# Patient Record
Sex: Female | Born: 1950 | Race: Black or African American | Hispanic: No | Marital: Married | Smoking: Former smoker
Health system: Southern US, Community
[De-identification: ages and names within clinical notes are randomized; demographics above are authoritative.]

## PROBLEM LIST (undated history)

## (undated) DIAGNOSIS — I10 Essential (primary) hypertension: Secondary | ICD-10-CM

## (undated) DIAGNOSIS — G510 Bell's palsy: Secondary | ICD-10-CM

## (undated) DIAGNOSIS — J3089 Other allergic rhinitis: Secondary | ICD-10-CM

## (undated) DIAGNOSIS — E119 Type 2 diabetes mellitus without complications: Secondary | ICD-10-CM

## (undated) DIAGNOSIS — R0600 Dyspnea, unspecified: Secondary | ICD-10-CM

## (undated) DIAGNOSIS — K219 Gastro-esophageal reflux disease without esophagitis: Secondary | ICD-10-CM

## (undated) DIAGNOSIS — T4145XA Adverse effect of unspecified anesthetic, initial encounter: Secondary | ICD-10-CM

## (undated) DIAGNOSIS — E0590 Thyrotoxicosis, unspecified without thyrotoxic crisis or storm: Secondary | ICD-10-CM

## (undated) DIAGNOSIS — I209 Angina pectoris, unspecified: Secondary | ICD-10-CM

## (undated) DIAGNOSIS — G4730 Sleep apnea, unspecified: Secondary | ICD-10-CM

## (undated) DIAGNOSIS — T8859XA Other complications of anesthesia, initial encounter: Secondary | ICD-10-CM

## (undated) DIAGNOSIS — N189 Chronic kidney disease, unspecified: Secondary | ICD-10-CM

## (undated) DIAGNOSIS — H409 Unspecified glaucoma: Secondary | ICD-10-CM

## (undated) DIAGNOSIS — I219 Acute myocardial infarction, unspecified: Secondary | ICD-10-CM

## (undated) DIAGNOSIS — I509 Heart failure, unspecified: Secondary | ICD-10-CM

## (undated) HISTORY — PX: BREAST REDUCTION SURGERY: SHX8

## (undated) HISTORY — PX: CORONARY ANGIOPLASTY WITH STENT PLACEMENT: SHX49

## (undated) HISTORY — PX: APPENDECTOMY: SHX54

## (undated) HISTORY — PX: HERNIA REPAIR: SHX51

## (undated) HISTORY — PX: ABDOMINAL HYSTERECTOMY: SHX81

## (undated) HISTORY — PX: CATARACT EXTRACTION, BILATERAL: SHX1313

## (undated) HISTORY — PX: CHOLECYSTECTOMY: SHX55

---

## 2017-03-19 ENCOUNTER — Other Ambulatory Visit (HOSPITAL_COMMUNITY): Payer: Self-pay | Admitting: Orthopedic Surgery

## 2017-03-19 DIAGNOSIS — M75122 Complete rotator cuff tear or rupture of left shoulder, not specified as traumatic: Secondary | ICD-10-CM

## 2017-03-20 ENCOUNTER — Ambulatory Visit
Admission: RE | Admit: 2017-03-20 | Discharge: 2017-03-20 | Disposition: A | Discharge status: Home / Self Care | Payer: Self-pay | Source: Ambulatory Visit | Attending: Orthopedic Surgery | Admitting: Orthopedic Surgery

## 2017-03-20 ENCOUNTER — Other Ambulatory Visit: Payer: Self-pay | Admitting: Orthopedic Surgery

## 2017-03-20 DIAGNOSIS — M25512 Pain in left shoulder: Secondary | ICD-10-CM

## 2017-04-04 ENCOUNTER — Ambulatory Visit
Admission: RE | Admit: 2017-04-04 | Discharge: 2017-04-04 | Disposition: A | Discharge status: Home / Self Care | Payer: Non-veteran care | Source: Ambulatory Visit | Attending: Orthopedic Surgery | Admitting: Orthopedic Surgery

## 2017-04-04 DIAGNOSIS — M75122 Complete rotator cuff tear or rupture of left shoulder, not specified as traumatic: Secondary | ICD-10-CM | POA: Diagnosis not present

## 2017-04-04 DIAGNOSIS — M19012 Primary osteoarthritis, left shoulder: Secondary | ICD-10-CM | POA: Insufficient documentation

## 2017-04-22 ENCOUNTER — Other Ambulatory Visit (HOSPITAL_COMMUNITY): Payer: Non-veteran care

## 2017-04-23 ENCOUNTER — Other Ambulatory Visit: Payer: Self-pay | Admitting: Physician Assistant

## 2017-04-23 DIAGNOSIS — Z01810 Encounter for preprocedural cardiovascular examination: Secondary | ICD-10-CM

## 2017-04-25 ENCOUNTER — Inpatient Hospital Stay: Admission: RE | Admit: 2017-04-25 | Payer: Self-pay | Source: Ambulatory Visit

## 2017-04-26 ENCOUNTER — Telehealth (HOSPITAL_COMMUNITY): Payer: Self-pay | Admitting: *Deleted

## 2017-04-26 NOTE — Telephone Encounter (Signed)
Patient given detailed instructions per Stress Test Requisition Sheet for test on 04/29/17 at 7:30.Patient Notified to arrive 30 minutes early, and that it is imperative to arrive on time for appointment to keep from having the test rescheduled.  Patient verbalized understanding. Katrina Hughes

## 2017-04-29 ENCOUNTER — Ambulatory Visit (HOSPITAL_BASED_OUTPATIENT_CLINIC_OR_DEPARTMENT_OTHER): Payer: Non-veteran care

## 2017-04-29 ENCOUNTER — Ambulatory Visit (HOSPITAL_COMMUNITY): Payer: Non-veteran care | Attending: Internal Medicine

## 2017-04-29 DIAGNOSIS — Z01810 Encounter for preprocedural cardiovascular examination: Secondary | ICD-10-CM | POA: Insufficient documentation

## 2017-04-29 MED ORDER — SODIUM CHLORIDE 0.9 % IV SOLN
10.0000 ug/kg/min | INTRAVENOUS | Status: AC
  Administered 2017-04-29: 30 ug/kg/min via INTRAVENOUS

## 2017-04-30 ENCOUNTER — Other Ambulatory Visit (HOSPITAL_COMMUNITY): Payer: Non-veteran care

## 2017-05-09 ENCOUNTER — Inpatient Hospital Stay: Admission: RE | Admit: 2017-05-09 | Payer: Non-veteran care | Source: Ambulatory Visit

## 2017-05-10 ENCOUNTER — Inpatient Hospital Stay: Admission: RE | Admit: 2017-05-10 | Payer: Non-veteran care | Source: Ambulatory Visit

## 2017-05-28 ENCOUNTER — Encounter: Payer: Self-pay | Admitting: *Deleted

## 2017-05-28 ENCOUNTER — Encounter
Admission: RE | Admit: 2017-05-28 | Discharge: 2017-05-28 | Disposition: A | Discharge status: Home / Self Care | Payer: Non-veteran care | Source: Ambulatory Visit | Attending: Orthopedic Surgery | Admitting: Orthopedic Surgery

## 2017-05-28 ENCOUNTER — Other Ambulatory Visit: Payer: Self-pay

## 2017-05-28 DIAGNOSIS — R9431 Abnormal electrocardiogram [ECG] [EKG]: Secondary | ICD-10-CM | POA: Diagnosis not present

## 2017-05-28 DIAGNOSIS — Z01810 Encounter for preprocedural cardiovascular examination: Secondary | ICD-10-CM | POA: Insufficient documentation

## 2017-05-28 DIAGNOSIS — Z01812 Encounter for preprocedural laboratory examination: Secondary | ICD-10-CM | POA: Insufficient documentation

## 2017-05-28 DIAGNOSIS — I252 Old myocardial infarction: Secondary | ICD-10-CM | POA: Diagnosis not present

## 2017-05-28 HISTORY — DX: Sleep apnea, unspecified: G47.30

## 2017-05-28 HISTORY — DX: Dyspnea, unspecified: R06.00

## 2017-05-28 HISTORY — DX: Gastro-esophageal reflux disease without esophagitis: K21.9

## 2017-05-28 HISTORY — DX: Unspecified glaucoma: H40.9

## 2017-05-28 HISTORY — DX: Other complications of anesthesia, initial encounter: T88.59XA

## 2017-05-28 HISTORY — DX: Essential (primary) hypertension: I10

## 2017-05-28 HISTORY — DX: Heart failure, unspecified: I50.9

## 2017-05-28 HISTORY — DX: Acute myocardial infarction, unspecified: I21.9

## 2017-05-28 HISTORY — DX: Type 2 diabetes mellitus without complications: E11.9

## 2017-05-28 HISTORY — DX: Bell's palsy: G51.0

## 2017-05-28 HISTORY — DX: Other allergic rhinitis: J30.89

## 2017-05-28 HISTORY — DX: Thyrotoxicosis, unspecified without thyrotoxic crisis or storm: E05.90

## 2017-05-28 HISTORY — DX: Adverse effect of unspecified anesthetic, initial encounter: T41.45XA

## 2017-05-28 LAB — BASIC METABOLIC PANEL
ANION GAP: 11 (ref 5–15)
BUN: 17 mg/dL (ref 6–20)
CHLORIDE: 104 mmol/L (ref 101–111)
CO2: 21 mmol/L — AB (ref 22–32)
Calcium: 10.2 mg/dL (ref 8.9–10.3)
Creatinine, Ser: 1.94 mg/dL — ABNORMAL HIGH (ref 0.44–1.00)
GFR calc non Af Amer: 26 mL/min — ABNORMAL LOW (ref >60)
GFR, EST AFRICAN AMERICAN: 30 mL/min — AB (ref >60)
Glucose, Bld: 160 mg/dL — ABNORMAL HIGH (ref 65–99)
Potassium: 3.6 mmol/L (ref 3.5–5.1)
SODIUM: 136 mmol/L (ref 135–145)

## 2017-05-28 LAB — CBC
HEMATOCRIT: 40.7 % (ref 35.0–47.0)
HEMOGLOBIN: 13.2 g/dL (ref 12.0–16.0)
MCH: 30.7 pg (ref 26.0–34.0)
MCHC: 32.4 g/dL (ref 32.0–36.0)
MCV: 94.9 fL (ref 80.0–100.0)
Platelets: 193 10*3/uL (ref 150–440)
RBC: 4.29 MIL/uL (ref 3.80–5.20)
RDW: 14.7 % — ABNORMAL HIGH (ref 11.5–14.5)
WBC: 6 10*3/uL (ref 3.6–11.0)

## 2017-05-28 LAB — URINALYSIS, COMPLETE (UACMP) WITH MICROSCOPIC
Bacteria, UA: NONE SEEN
Bilirubin Urine: NEGATIVE
Glucose, UA: NEGATIVE mg/dL
Hgb urine dipstick: NEGATIVE
Ketones, ur: NEGATIVE mg/dL
Nitrite: NEGATIVE
PH: 5 (ref 5.0–8.0)
Protein, ur: 100 mg/dL — AB
SPECIFIC GRAVITY, URINE: 1.012 (ref 1.005–1.030)

## 2017-05-28 LAB — PROTIME-INR
INR: 0.99
PROTHROMBIN TIME: 13 s (ref 11.4–15.2)

## 2017-05-28 LAB — SURGICAL PCR SCREEN
MRSA, PCR: NEGATIVE
STAPHYLOCOCCUS AUREUS: NEGATIVE

## 2017-05-28 NOTE — Patient Instructions (Signed)
Your procedure is scheduled on: 06/03/17 Mon Report to Same Day Surgery 2nd floor medical mall [facility] Entrance-take elevator on left to 2nd floor.  Check in with surgery information desk.) To find out your arrival time please call [phone] between 1PM - 3PM on 05/31/17 Fri  Remember: Instructions that are not followed completely may result in serious medical risk, up to and including death, or upon the discretion of your surgeon and anesthesiologist your surgery may need to be rescheduled.    _x___ 1. Do not eat food after midnight the night before your procedure. You may drink clear liquids up to 2 hours before you are scheduled to arrive at the hospital for your procedure.  Do not drink clear liquids within 2 hours of your scheduled arrival to the hospital.  Clear liquids include  --Water or Apple juice without pulp  --Clear carbohydrate beverage such as ClearFast or Gatorade  --Black Coffee or Clear Tea (No milk, no creamers, do not add anything to                  the coffee or Tea Type 1 and type 2 diabetics should only drink water.  No gum chewing or hard candies.     __x__ 2. No Alcohol for 24 hours before or after surgery.   __x__3. No Smoking or e-cigarettes for 24 prior to surgery.  Do not use any chewable tobacco products for at least 6 hour prior to surgery   ____  4. Bring all medications with you on the day of surgery if instructed.    __x__ 5. Notify your doctor if there is any change in your medical condition     (cold, fever, infections).    x___6. On the morning of surgery brush your teeth with toothpaste and water.  You may rinse your mouth with mouth wash if you wish.  Do not swallow any toothpaste or mouthwash.   Do not wear jewelry, make-up, hairpins, clips or nail polish.  Do not wear lotions, powders, or perfumes. You may wear deodorant.  Do not shave 48 hours prior to surgery. Men may shave face and neck.  Do not bring valuables to the hospital.     [facility] is not responsible for any belongings or valuables.               Contacts, dentures or bridgework may not be worn into surgery.  Leave your suitcase in the car. After surgery it may be brought to your room.  For patients admitted to the hospital, discharge time is determined by your                       treatment team.  _  Patients discharged the day of surgery will not be allowed to drive home.  You will need someone to drive you home and stay with you the night of your procedure.    Please read over the following fact sheets that you were given:   [facility] Preparing for Surgery and or MRSA Information   _x___ Take anti-hypertensive listed below, cardiac, seizure, asthma,     anti-reflux and psychiatric medicines. These include:  1. carvedilol (COREG) 12.5 MG tablet  2.latanoprost (XALATAN) 0.005 % ophthalmic solution  3.methimazole (TAPAZOLE) 5 MG tablet  4.pantoprazole (PROTONIX) 20 MG tablet  5.timolol (BETIMOL) 0.5 % ophthalmic solution  6.  ____Fleets enema or Magnesium Citrate as directed.   _x___ Use CHG Soap or sage wipes as  directed on instruction sheet   ____ Use inhalers on the day of surgery and bring to hospital day of surgery  ____ Stop Metformin and Janumet 2 days prior to surgery.    ___x_ Take 1/2 of usual insulin dose the night before surgery and none on the morning     surgery.   _x___ Follow recommendations from Cardiologist, Pulmonologist or PCP regarding          stopping Aspirin, Coumadin, Plavix ,Eliquis, Effient, or Pradaxa, and Pletal.               Stop  Plavix if OK with physician  X____Stop Anti-inflammatories such as Advil, Aleve, Ibuprofen, Motrin, Naproxen, Naprosyn, Goodies powders or aspirin products. OK to take Tylenol and                          Celebrex.   _x___ Stop supplements until after surgery.  But may continue Vitamin D, Vitamin B,       and multivitamin.   __x__ Bring C-Pap to the hospital.

## 2017-05-28 NOTE — Pre-Procedure Instructions (Addendum)
Called anesthesia regarding medical history, medical clearance requested.  Called and faxed EKG and clearance request to Dr Verner Chol.  Dr Serita Grit office called and faxed the above info and abnormal labs.

## 2017-05-29 LAB — URINE CULTURE: Culture: NO GROWTH

## 2017-05-29 LAB — TYPE AND SCREEN
ABO/RH(D): O POS
ANTIBODY SCREEN: NEGATIVE

## 2017-05-29 NOTE — Pre-Procedure Instructions (Addendum)
FAXED EKG TO DR Verner Chol TO ADDRESS AS PART OF CLEARANCE REQUESTED 05/28/17. ALSO FAXED [place]. HAD ALREADY BEEN FAXED WITH LABS 05/28/17 BY Ewing Schlein RN

## 2017-05-30 NOTE — Pre-Procedure Instructions (Signed)
Copy of clearance notes faxed to Dr. Serita Grit office attention [place].

## 2017-06-06 ENCOUNTER — Ambulatory Visit (INDEPENDENT_AMBULATORY_CARE_PROVIDER_SITE_OTHER): Payer: Non-veteran care | Admitting: Podiatry

## 2017-06-06 DIAGNOSIS — D229 Melanocytic nevi, unspecified: Secondary | ICD-10-CM

## 2017-06-06 NOTE — Progress Notes (Signed)
   Subjective:    Patient ID: Katrina Hughes, female    DOB: 05-Nov-1950, 67 y.o.   MRN: [id number]  HPI    Review of Systems  All other systems reviewed and are negative.      Objective:   Physical Exam        Assessment & Plan:

## 2017-06-07 NOTE — Pre-Procedure Instructions (Signed)
Telephone call to patient to review her preop instructions since her previously scheduled surgery had been cancelled.  I spoke with patient and then her wife to review medications to take on the morning of surgery and what to stop.  Reminded patient to use the CHG soap and stop Plavix.  She had stopped it about 10 days ago for another procedure then recently restarted. Questions answered to both individuals and they each expressed understanding of instructions provided.

## 2017-06-09 MED ORDER — CEFAZOLIN SODIUM-DEXTROSE 2-4 GM/100ML-% IV SOLN
2.0000 g | Freq: Once | INTRAVENOUS | Status: AC
  Administered 2017-06-10: 2 g via INTRAVENOUS

## 2017-06-10 ENCOUNTER — Inpatient Hospital Stay: Payer: Non-veteran care | Admitting: Anesthesiology

## 2017-06-10 ENCOUNTER — Inpatient Hospital Stay: Payer: Non-veteran care

## 2017-06-10 ENCOUNTER — Other Ambulatory Visit: Payer: Self-pay

## 2017-06-10 ENCOUNTER — Encounter
Admission: RE | Disposition: A | Discharge status: Home Health | Payer: Self-pay | Source: Ambulatory Visit | Attending: Orthopedic Surgery

## 2017-06-10 ENCOUNTER — Encounter: Payer: Self-pay | Admitting: *Deleted

## 2017-06-10 ENCOUNTER — Inpatient Hospital Stay
Admission: RE | Admit: 2017-06-10 | Discharge: 2017-06-11 | DRG: 483 | Disposition: A | Discharge status: Home Health | Payer: Non-veteran care | Source: Ambulatory Visit | Attending: Orthopedic Surgery | Admitting: Orthopedic Surgery

## 2017-06-10 DIAGNOSIS — Z7951 Long term (current) use of inhaled steroids: Secondary | ICD-10-CM

## 2017-06-10 DIAGNOSIS — E119 Type 2 diabetes mellitus without complications: Secondary | ICD-10-CM | POA: Diagnosis present

## 2017-06-10 DIAGNOSIS — G4730 Sleep apnea, unspecified: Secondary | ICD-10-CM | POA: Diagnosis present

## 2017-06-10 DIAGNOSIS — I252 Old myocardial infarction: Secondary | ICD-10-CM | POA: Diagnosis not present

## 2017-06-10 DIAGNOSIS — E0590 Thyrotoxicosis, unspecified without thyrotoxic crisis or storm: Secondary | ICD-10-CM | POA: Diagnosis present

## 2017-06-10 DIAGNOSIS — Z886 Allergy status to analgesic agent status: Secondary | ICD-10-CM

## 2017-06-10 DIAGNOSIS — Z794 Long term (current) use of insulin: Secondary | ICD-10-CM

## 2017-06-10 DIAGNOSIS — Z7902 Long term (current) use of antithrombotics/antiplatelets: Secondary | ICD-10-CM | POA: Diagnosis not present

## 2017-06-10 DIAGNOSIS — M19012 Primary osteoarthritis, left shoulder: Principal | ICD-10-CM | POA: Diagnosis present

## 2017-06-10 DIAGNOSIS — K219 Gastro-esophageal reflux disease without esophagitis: Secondary | ICD-10-CM | POA: Diagnosis present

## 2017-06-10 DIAGNOSIS — Z882 Allergy status to sulfonamides status: Secondary | ICD-10-CM | POA: Diagnosis not present

## 2017-06-10 DIAGNOSIS — Z87891 Personal history of nicotine dependence: Secondary | ICD-10-CM

## 2017-06-10 DIAGNOSIS — I509 Heart failure, unspecified: Secondary | ICD-10-CM | POA: Diagnosis present

## 2017-06-10 DIAGNOSIS — S46212A Strain of muscle, fascia and tendon of other parts of biceps, left arm, initial encounter: Secondary | ICD-10-CM | POA: Diagnosis present

## 2017-06-10 DIAGNOSIS — I110 Hypertensive heart disease with heart failure: Secondary | ICD-10-CM | POA: Diagnosis present

## 2017-06-10 DIAGNOSIS — Z96619 Presence of unspecified artificial shoulder joint: Secondary | ICD-10-CM

## 2017-06-10 DIAGNOSIS — H409 Unspecified glaucoma: Secondary | ICD-10-CM | POA: Diagnosis present

## 2017-06-10 DIAGNOSIS — M25512 Pain in left shoulder: Secondary | ICD-10-CM | POA: Diagnosis present

## 2017-06-10 HISTORY — PX: REVERSE SHOULDER ARTHROPLASTY: SHX5054

## 2017-06-10 HISTORY — PX: BICEPT TENODESIS: SHX5116

## 2017-06-10 LAB — GLUCOSE, CAPILLARY
GLUCOSE-CAPILLARY: 99 mg/dL (ref 65–99)
GLUCOSE-CAPILLARY: 104 mg/dL — AB (ref 65–99)
GLUCOSE-CAPILLARY: 148 mg/dL — AB (ref 65–99)
Glucose-Capillary: 208 mg/dL — ABNORMAL HIGH (ref 65–99)

## 2017-06-10 LAB — ABO/RH: ABO/RH(D): O POS

## 2017-06-10 SURGERY — ARTHROPLASTY, SHOULDER, TOTAL, REVERSE
Anesthesia: Regional | Site: Shoulder | Laterality: Left | Wound class: Clean

## 2017-06-10 MED ORDER — TROLAMINE SALICYLATE 10 % EX CREA
1.0000 "application " | TOPICAL_CREAM | Freq: Two times a day (BID) | CUTANEOUS | Status: DC | PRN
  Filled 2017-06-10: qty 85

## 2017-06-10 MED ORDER — ADULT MULTIVITAMIN W/MINERALS CH
1.0000 | ORAL_TABLET | Freq: Every day | ORAL | Status: DC
  Administered 2017-06-11: 1 via ORAL
  Filled 2017-06-10: qty 1

## 2017-06-10 MED ORDER — EPHEDRINE SULFATE 50 MG/ML IJ SOLN
INTRAMUSCULAR | Status: AC
  Filled 2017-06-10: qty 1

## 2017-06-10 MED ORDER — MENTHOL 3 MG MT LOZG
1.0000 | LOZENGE | OROMUCOSAL | Status: DC | PRN
  Filled 2017-06-10: qty 9

## 2017-06-10 MED ORDER — CEFAZOLIN SODIUM-DEXTROSE 2-4 GM/100ML-% IV SOLN
2.0000 g | Freq: Four times a day (QID) | INTRAVENOUS | Status: AC
  Administered 2017-06-10 – 2017-06-11 (×3): 2 g via INTRAVENOUS
  Filled 2017-06-10 (×3): qty 100

## 2017-06-10 MED ORDER — ROCURONIUM BROMIDE 50 MG/5ML IV SOLN
INTRAVENOUS | Status: AC
  Filled 2017-06-10: qty 2

## 2017-06-10 MED ORDER — OXYCODONE HCL 5 MG PO TABS
5.0000 mg | ORAL_TABLET | ORAL | Status: DC | PRN
  Administered 2017-06-10: 10 mg via ORAL
  Administered 2017-06-10: 5 mg via ORAL
  Administered 2017-06-11: 10 mg via ORAL
  Filled 2017-06-10 (×3): qty 1
  Filled 2017-06-10 (×2): qty 2

## 2017-06-10 MED ORDER — CARVEDILOL 3.125 MG PO TABS
6.2500 mg | ORAL_TABLET | Freq: Two times a day (BID) | ORAL | Status: DC
  Administered 2017-06-10 – 2017-06-11 (×2): 6.25 mg via ORAL
  Filled 2017-06-10 (×2): qty 2

## 2017-06-10 MED ORDER — BISACODYL 10 MG RE SUPP: 10.0000 mg | Freq: Every day | RECTAL | Status: DC | PRN

## 2017-06-10 MED ORDER — SUCCINYLCHOLINE CHLORIDE 20 MG/ML IJ SOLN
INTRAMUSCULAR | Status: DC | PRN
  Administered 2017-06-10: 100 mg via INTRAVENOUS

## 2017-06-10 MED ORDER — SUGAMMADEX SODIUM 200 MG/2ML IV SOLN
INTRAVENOUS | Status: AC
  Filled 2017-06-10: qty 2

## 2017-06-10 MED ORDER — PROPOFOL 10 MG/ML IV BOLUS
INTRAVENOUS | Status: DC | PRN
  Administered 2017-06-10: 140 mg via INTRAVENOUS

## 2017-06-10 MED ORDER — LIDOCAINE HCL (CARDIAC) 20 MG/ML IV SOLN
INTRAVENOUS | Status: DC | PRN
  Administered 2017-06-10: 100 mg via INTRAVENOUS

## 2017-06-10 MED ORDER — GUAIFENESIN 100 MG/5ML PO SYRP
300.0000 mg | ORAL_SOLUTION | Freq: Three times a day (TID) | ORAL | Status: DC | PRN
  Filled 2017-06-10: qty 15

## 2017-06-10 MED ORDER — METOCLOPRAMIDE HCL 10 MG PO TABS: 5.0000 mg | ORAL_TABLET | Freq: Three times a day (TID) | ORAL | Status: DC | PRN

## 2017-06-10 MED ORDER — ONDANSETRON HCL 4 MG/2ML IJ SOLN
4.0000 mg | Freq: Four times a day (QID) | INTRAMUSCULAR | Status: DC | PRN
  Administered 2017-06-10: 4 mg via INTRAVENOUS
  Filled 2017-06-10: qty 2

## 2017-06-10 MED ORDER — TIMOLOL MALEATE 0.5 % OP SOLN
1.0000 [drp] | Freq: Three times a day (TID) | OPHTHALMIC | Status: DC
  Administered 2017-06-10 – 2017-06-11 (×3): 1 [drp] via OPHTHALMIC
  Filled 2017-06-10: qty 5

## 2017-06-10 MED ORDER — ONDANSETRON HCL 4 MG PO TABS
4.0000 mg | ORAL_TABLET | Freq: Four times a day (QID) | ORAL | Status: DC | PRN
Start: 2017-06-10 — End: 2017-06-11

## 2017-06-10 MED ORDER — ALUM & MAG HYDROXIDE-SIMETH 200-200-20 MG/5ML PO SUSP: 30.0000 mL | ORAL | Status: DC | PRN

## 2017-06-10 MED ORDER — INSULIN GLARGINE 100 UNIT/ML ~~LOC~~ SOLN
25.0000 [IU] | Freq: Every day | SUBCUTANEOUS | Status: DC
  Administered 2017-06-10: 25 [IU] via SUBCUTANEOUS
  Filled 2017-06-10 (×2): qty 0.25

## 2017-06-10 MED ORDER — POLYVINYL ALCOHOL 1.4 % OP SOLN
1.0000 [drp] | Freq: Three times a day (TID) | OPHTHALMIC | Status: DC
  Administered 2017-06-10 – 2017-06-11 (×3): 1 [drp] via OPHTHALMIC
  Filled 2017-06-10: qty 15

## 2017-06-10 MED ORDER — SALINE SPRAY 0.65 % NA SOLN
1.0000 | NASAL | Status: DC | PRN
  Filled 2017-06-10: qty 44

## 2017-06-10 MED ORDER — ONDANSETRON HCL 4 MG/2ML IJ SOLN: 4.0000 mg | Freq: Once | INTRAMUSCULAR | Status: DC | PRN

## 2017-06-10 MED ORDER — ONDANSETRON HCL 4 MG/2ML IJ SOLN
INTRAMUSCULAR | Status: DC | PRN
  Administered 2017-06-10: 4 mg via INTRAVENOUS

## 2017-06-10 MED ORDER — PANTOPRAZOLE SODIUM 40 MG PO TBEC
40.0000 mg | DELAYED_RELEASE_TABLET | Freq: Two times a day (BID) | ORAL | Status: DC
  Administered 2017-06-10 – 2017-06-11 (×2): 40 mg via ORAL
  Filled 2017-06-10 (×2): qty 1

## 2017-06-10 MED ORDER — ASPIRIN EC 325 MG PO TBEC
325.0000 mg | DELAYED_RELEASE_TABLET | Freq: Every day | ORAL | Status: DC
  Administered 2017-06-11: 325 mg via ORAL
  Filled 2017-06-10: qty 1

## 2017-06-10 MED ORDER — BUPIVACAINE LIPOSOME 1.3 % IJ SUSP
INTRAMUSCULAR | Status: DC | PRN
  Administered 2017-06-10: 20 mL

## 2017-06-10 MED ORDER — INSULIN ASPART 100 UNIT/ML ~~LOC~~ SOLN
0.0000 [IU] | Freq: Three times a day (TID) | SUBCUTANEOUS | Status: DC
  Administered 2017-06-10 – 2017-06-11 (×3): 1 [IU] via SUBCUTANEOUS
  Filled 2017-06-10 (×3): qty 1

## 2017-06-10 MED ORDER — SUGAMMADEX SODIUM 200 MG/2ML IV SOLN
INTRAVENOUS | Status: DC | PRN
  Administered 2017-06-10: 200 mg via INTRAVENOUS

## 2017-06-10 MED ORDER — MIDAZOLAM HCL 2 MG/2ML IJ SOLN
INTRAMUSCULAR | Status: AC
  Administered 2017-06-10: 1 mg via INTRAVENOUS
  Filled 2017-06-10: qty 2

## 2017-06-10 MED ORDER — SEVOFLURANE IN SOLN
RESPIRATORY_TRACT | Status: AC
  Filled 2017-06-10: qty 250

## 2017-06-10 MED ORDER — METHIMAZOLE 5 MG PO TABS
5.0000 mg | ORAL_TABLET | Freq: Every day | ORAL | Status: DC
  Administered 2017-06-11: 5 mg via ORAL
  Filled 2017-06-10: qty 1

## 2017-06-10 MED ORDER — BUPIVACAINE-EPINEPHRINE (PF) 0.25% -1:200000 IJ SOLN
INTRAMUSCULAR | Status: DC | PRN
  Administered 2017-06-10: 30 mL via PERINEURAL

## 2017-06-10 MED ORDER — BUPIVACAINE LIPOSOME 1.3 % IJ SUSP
INTRAMUSCULAR | Status: AC
  Filled 2017-06-10: qty 20

## 2017-06-10 MED ORDER — MIDAZOLAM HCL 2 MG/2ML IJ SOLN
1.0000 mg | Freq: Once | INTRAMUSCULAR | Status: AC
  Administered 2017-06-10: 1 mg via INTRAVENOUS

## 2017-06-10 MED ORDER — SODIUM CHLORIDE 0.9 % IV SOLN
INTRAVENOUS | Status: DC
  Administered 2017-06-10: 15:00:00 via INTRAVENOUS

## 2017-06-10 MED ORDER — METHOCARBAMOL 1000 MG/10ML IJ SOLN
500.0000 mg | Freq: Four times a day (QID) | INTRAVENOUS | Status: DC | PRN
  Filled 2017-06-10: qty 5

## 2017-06-10 MED ORDER — NEOMYCIN-POLYMYXIN B GU 40-200000 IR SOLN
Status: AC
  Filled 2017-06-10: qty 20

## 2017-06-10 MED ORDER — FUROSEMIDE 40 MG PO TABS
40.0000 mg | ORAL_TABLET | Freq: Two times a day (BID) | ORAL | Status: DC
  Administered 2017-06-10 – 2017-06-11 (×2): 40 mg via ORAL
  Filled 2017-06-10 (×2): qty 1

## 2017-06-10 MED ORDER — VANCOMYCIN HCL 1000 MG IV SOLR
INTRAVENOUS | Status: AC
  Filled 2017-06-10: qty 1000

## 2017-06-10 MED ORDER — FENTANYL CITRATE (PF) 100 MCG/2ML IJ SOLN
INTRAMUSCULAR | Status: AC
  Filled 2017-06-10: qty 2

## 2017-06-10 MED ORDER — FENTANYL CITRATE (PF) 100 MCG/2ML IJ SOLN
25.0000 ug | Freq: Once | INTRAMUSCULAR | Status: AC
  Administered 2017-06-10: 25 ug via INTRAVENOUS

## 2017-06-10 MED ORDER — CEFAZOLIN SODIUM-DEXTROSE 2-4 GM/100ML-% IV SOLN
INTRAVENOUS | Status: AC
  Filled 2017-06-10: qty 100

## 2017-06-10 MED ORDER — ALLOPURINOL 100 MG PO TABS
200.0000 mg | ORAL_TABLET | Freq: Two times a day (BID) | ORAL | Status: DC
  Administered 2017-06-10 – 2017-06-11 (×2): 200 mg via ORAL
  Filled 2017-06-10 (×4): qty 2

## 2017-06-10 MED ORDER — FENTANYL CITRATE (PF) 100 MCG/2ML IJ SOLN
INTRAMUSCULAR | Status: DC | PRN
  Administered 2017-06-10 (×4): 50 ug via INTRAVENOUS

## 2017-06-10 MED ORDER — OXYCODONE HCL 5 MG PO TABS
10.0000 mg | ORAL_TABLET | ORAL | Status: DC | PRN
  Administered 2017-06-10 – 2017-06-11 (×2): 10 mg via ORAL
  Administered 2017-06-11: 15 mg via ORAL
  Filled 2017-06-10: qty 2
  Filled 2017-06-10: qty 3

## 2017-06-10 MED ORDER — SODIUM CHLORIDE 0.9 % IV SOLN
INTRAVENOUS | Status: DC
  Administered 2017-06-10 (×2): via INTRAVENOUS

## 2017-06-10 MED ORDER — HYDROMORPHONE HCL 1 MG/ML IJ SOLN
0.5000 mg | INTRAMUSCULAR | Status: DC | PRN
  Administered 2017-06-10 – 2017-06-11 (×2): 1 mg via INTRAVENOUS
  Filled 2017-06-10 (×2): qty 1

## 2017-06-10 MED ORDER — DOCUSATE SODIUM 100 MG PO CAPS
100.0000 mg | ORAL_CAPSULE | Freq: Two times a day (BID) | ORAL | Status: DC
  Administered 2017-06-10 – 2017-06-11 (×2): 100 mg via ORAL
  Filled 2017-06-10 (×2): qty 1

## 2017-06-10 MED ORDER — METOCLOPRAMIDE HCL 5 MG/ML IJ SOLN
5.0000 mg | Freq: Three times a day (TID) | INTRAMUSCULAR | Status: DC | PRN
  Administered 2017-06-11: 10 mg via INTRAVENOUS
  Filled 2017-06-10: qty 2

## 2017-06-10 MED ORDER — SPIRONOLACTONE 25 MG PO TABS
25.0000 mg | ORAL_TABLET | Freq: Two times a day (BID) | ORAL | Status: DC
  Administered 2017-06-10: 25 mg via ORAL
  Filled 2017-06-10: qty 1

## 2017-06-10 MED ORDER — DIPHENHYDRAMINE HCL 12.5 MG/5ML PO ELIX: 12.5000 mg | ORAL_SOLUTION | ORAL | Status: DC | PRN

## 2017-06-10 MED ORDER — ROPIVACAINE HCL 5 MG/ML IJ SOLN
INTRAMUSCULAR | Status: DC | PRN
  Administered 2017-06-10: 20 mL via EPIDURAL

## 2017-06-10 MED ORDER — SENNOSIDES-DOCUSATE SODIUM 8.6-50 MG PO TABS: 1.0000 | ORAL_TABLET | Freq: Every evening | ORAL | Status: DC | PRN

## 2017-06-10 MED ORDER — LATANOPROST 0.005 % OP SOLN
1.0000 [drp] | Freq: Every day | OPHTHALMIC | Status: DC
  Administered 2017-06-10: 1 [drp] via OPHTHALMIC
  Filled 2017-06-10: qty 2.5

## 2017-06-10 MED ORDER — ONDANSETRON HCL 4 MG/2ML IJ SOLN
INTRAMUSCULAR | Status: AC
  Filled 2017-06-10: qty 2

## 2017-06-10 MED ORDER — NEOMYCIN-POLYMYXIN B GU 40-200000 IR SOLN
Status: DC | PRN
  Administered 2017-06-10: 16 mL

## 2017-06-10 MED ORDER — SENNA 8.6 MG PO TABS
1.0000 | ORAL_TABLET | Freq: Two times a day (BID) | ORAL | Status: DC
  Administered 2017-06-11: 8.6 mg via ORAL
  Filled 2017-06-10 (×2): qty 1

## 2017-06-10 MED ORDER — VANCOMYCIN HCL 1000 MG IV SOLR
INTRAVENOUS | Status: DC | PRN
  Administered 2017-06-10: 1000 mg via TOPICAL

## 2017-06-10 MED ORDER — METHOCARBAMOL 500 MG PO TABS
500.0000 mg | ORAL_TABLET | Freq: Four times a day (QID) | ORAL | Status: DC | PRN
  Administered 2017-06-10 – 2017-06-11 (×3): 500 mg via ORAL
  Filled 2017-06-10 (×3): qty 1

## 2017-06-10 MED ORDER — NITROGLYCERIN 0.6 MG/HR TD PT24
0.6000 mg | MEDICATED_PATCH | Freq: Every day | TRANSDERMAL | Status: DC
  Administered 2017-06-10 – 2017-06-11 (×2): 0.6 mg via TRANSDERMAL
  Filled 2017-06-10 (×2): qty 1

## 2017-06-10 MED ORDER — PROPOFOL 10 MG/ML IV BOLUS
INTRAVENOUS | Status: AC
  Filled 2017-06-10: qty 20

## 2017-06-10 MED ORDER — CALCITRIOL 0.25 MCG PO CAPS
0.2500 ug | ORAL_CAPSULE | Freq: Two times a day (BID) | ORAL | Status: DC
  Administered 2017-06-10 – 2017-06-11 (×2): 0.25 ug via ORAL
  Filled 2017-06-10 (×4): qty 1

## 2017-06-10 MED ORDER — FENTANYL CITRATE (PF) 100 MCG/2ML IJ SOLN
INTRAMUSCULAR | Status: AC
  Administered 2017-06-10: 25 ug via INTRAVENOUS
  Filled 2017-06-10: qty 2

## 2017-06-10 MED ORDER — SUCCINYLCHOLINE CHLORIDE 20 MG/ML IJ SOLN
INTRAMUSCULAR | Status: AC
  Filled 2017-06-10: qty 1

## 2017-06-10 MED ORDER — FENTANYL CITRATE (PF) 100 MCG/2ML IJ SOLN: 25.0000 ug | INTRAMUSCULAR | Status: DC | PRN

## 2017-06-10 MED ORDER — ROCURONIUM BROMIDE 100 MG/10ML IV SOLN
INTRAVENOUS | Status: DC | PRN
  Administered 2017-06-10: 20 mg via INTRAVENOUS
  Administered 2017-06-10: 50 mg via INTRAVENOUS
  Administered 2017-06-10: 30 mg via INTRAVENOUS

## 2017-06-10 MED ORDER — ROPIVACAINE HCL 5 MG/ML IJ SOLN
INTRAMUSCULAR | Status: AC
  Filled 2017-06-10: qty 30

## 2017-06-10 MED ORDER — EPHEDRINE SULFATE 50 MG/ML IJ SOLN
INTRAMUSCULAR | Status: DC | PRN
  Administered 2017-06-10: 10 mg via INTRAVENOUS
  Administered 2017-06-10: 5 mg via INTRAVENOUS
  Administered 2017-06-10 (×3): 10 mg via INTRAVENOUS

## 2017-06-10 MED ORDER — ACETAMINOPHEN 10 MG/ML IV SOLN
INTRAVENOUS | Status: AC
  Filled 2017-06-10: qty 100

## 2017-06-10 MED ORDER — POTASSIUM CHLORIDE CRYS ER 10 MEQ PO TBCR: 10.0000 meq | EXTENDED_RELEASE_TABLET | Freq: Every day | ORAL | Status: DC

## 2017-06-10 MED ORDER — BUPIVACAINE HCL (PF) 0.25 % IJ SOLN
INTRAMUSCULAR | Status: AC
  Filled 2017-06-10: qty 30

## 2017-06-10 MED ORDER — LIDOCAINE HCL (PF) 2 % IJ SOLN
INTRAMUSCULAR | Status: AC
  Filled 2017-06-10: qty 10

## 2017-06-10 MED ORDER — PHENOL 1.4 % MT LIQD
1.0000 | OROMUCOSAL | Status: DC | PRN
  Filled 2017-06-10: qty 177

## 2017-06-10 MED ORDER — ACETAMINOPHEN 10 MG/ML IV SOLN
INTRAVENOUS | Status: DC | PRN
  Administered 2017-06-10: 1000 mg via INTRAVENOUS

## 2017-06-10 MED ORDER — ATORVASTATIN CALCIUM 20 MG PO TABS
40.0000 mg | ORAL_TABLET | Freq: Every day | ORAL | Status: DC
  Administered 2017-06-10: 40 mg via ORAL
  Filled 2017-06-10: qty 2

## 2017-06-10 MED ORDER — ACETAMINOPHEN 500 MG PO TABS
1000.0000 mg | ORAL_TABLET | Freq: Four times a day (QID) | ORAL | Status: AC
  Administered 2017-06-10 – 2017-06-11 (×4): 1000 mg via ORAL
  Filled 2017-06-10 (×4): qty 2

## 2017-06-10 MED ORDER — INSULIN ASPART 100 UNIT/ML ~~LOC~~ SOLN: 5.0000 [IU] | Freq: Three times a day (TID) | SUBCUTANEOUS | Status: DC

## 2017-06-10 MED ORDER — LORATADINE 10 MG PO TABS
10.0000 mg | ORAL_TABLET | Freq: Every day | ORAL | Status: DC
Start: 2017-06-10 — End: 2017-06-11
  Administered 2017-06-11: 10 mg via ORAL
  Filled 2017-06-10: qty 1

## 2017-06-10 SURGICAL SUPPLY — 80 items
BASEPLATE P2 COATD GLND 6.5X30 (Shoulder) ×1 IMPLANT
BIT DRILL GUIDE PATIENT MATCH (MISCELLANEOUS) ×1 IMPLANT
BLADE SAGITTAL WIDE XTHICK NO (BLADE) ×3 IMPLANT
CANISTER SUCT 1200ML W/VALVE (MISCELLANEOUS) ×3 IMPLANT
CANISTER SUCT 3000ML PPV (MISCELLANEOUS) ×6 IMPLANT
CEMENT BONE GENTAMICIN (Cement) ×6 IMPLANT
CEMENT BONE GENTAMICIN PWDR (Cement) ×2 IMPLANT
CHLORAPREP W/TINT 26ML (MISCELLANEOUS) ×3 IMPLANT
CLOSURE WOUND 1/2 X4 (GAUZE/BANDAGES/DRESSINGS) ×1
CNTNR SPEC 2.5X3XGRAD LEK (MISCELLANEOUS)
CONT SPEC 4OZ STER OR WHT (MISCELLANEOUS)
CONTAINER SPEC 2.5X3XGRAD LEK (MISCELLANEOUS) IMPLANT
COOLER POLAR GLACIER W/PUMP (MISCELLANEOUS) ×3 IMPLANT
DRAPE IMP U-DRAPE 54X76 (DRAPES) ×6 IMPLANT
DRAPE INCISE IOBAN 66X45 STRL (DRAPES) ×6 IMPLANT
DRAPE SHEET LG 3/4 BI-LAMINATE (DRAPES) ×6 IMPLANT
DRAPE TABLE BACK 80X90 (DRAPES) ×3 IMPLANT
DRAPE U-SHAPE 47X51 STRL (DRAPES) ×3 IMPLANT
DRILL GUIDE PATIENT MATCH (MISCELLANEOUS) ×3
DRSG OPSITE POSTOP 4X8 (GAUZE/BANDAGES/DRESSINGS) ×3 IMPLANT
DRSG TEGADERM 2-3/8X2-3/4 SM (GAUZE/BANDAGES/DRESSINGS) ×3 IMPLANT
ELECT BLADE 6.5 EXT (BLADE) IMPLANT
ELECT CAUTERY BLADE 6.4 (BLADE) ×6 IMPLANT
ELECT REM PT RETURN 9FT ADLT (ELECTROSURGICAL) ×3
ELECTRODE REM PT RTRN 9FT ADLT (ELECTROSURGICAL) ×1 IMPLANT
EVACUATOR 1/8 PVC DRAIN (DRAIN) ×3 IMPLANT
GAUZE PETRO XEROFOAM 1X8 (MISCELLANEOUS) ×3 IMPLANT
GLOVE BIOGEL PI IND STRL 8 (GLOVE) ×2 IMPLANT
GLOVE BIOGEL PI INDICATOR 8 (GLOVE) ×4
GLOVE SURG ORTHO 8.0 STRL STRW (GLOVE) ×6 IMPLANT
GOWN STRL REUS W/ TWL LRG LVL3 (GOWN DISPOSABLE) ×2 IMPLANT
GOWN STRL REUS W/ TWL XL LVL3 (GOWN DISPOSABLE) ×1 IMPLANT
GOWN STRL REUS W/TWL LRG LVL3 (GOWN DISPOSABLE) ×4
GOWN STRL REUS W/TWL XL LVL3 (GOWN DISPOSABLE) ×2
HEMOVAC 400CC 10FR (MISCELLANEOUS) ×3 IMPLANT
HOOD PEEL AWAY FLYTE STAYCOOL (MISCELLANEOUS) ×9 IMPLANT
INSERT SMALL SOCKET 32MM NEU (Insert) ×3 IMPLANT
KIT STABILIZATION SHOULDER (MISCELLANEOUS) ×3 IMPLANT
MASK FACE SPIDER DISP (MASK) ×3 IMPLANT
MAT BLUE FLOOR 46X72 FLO (MISCELLANEOUS) ×3 IMPLANT
NDL SAFETY ECLIPSE 18X1.5 (NEEDLE) ×3 IMPLANT
NEEDLE HYPO 18GX1.5 SHARP (NEEDLE) ×6
NEEDLE REVERSE CUT 1/2 CRC (NEEDLE) ×3 IMPLANT
NEEDLE SPNL 20GX3.5 QUINCKE YW (NEEDLE) ×3 IMPLANT
NOZZLE OPTIVAC SLIM 4154 (MISCELLANEOUS) ×3 IMPLANT
NS IRRIG 1000ML POUR BTL (IV SOLUTION) ×3 IMPLANT
P2 COATDE GLNOID BSEPLT 6.5X30 (Shoulder) ×3 IMPLANT
PACK ARTHROSCOPY SHOULDER (MISCELLANEOUS) ×3 IMPLANT
PAD WRAPON POLAR SHDR UNIV (MISCELLANEOUS) ×1 IMPLANT
PASSER SUT SWANSON 36MM LOOP (INSTRUMENTS) ×3 IMPLANT
PULSAVAC PLUS IRRIG FAN TIP (DISPOSABLE) ×3
RESTRICTOR CEMENT S14 BIOABSRB (Cement) ×3 IMPLANT
RETRIEVER SUT HEWSON (MISCELLANEOUS) ×3 IMPLANT
SCREW BONE LOCKING RSP 5.0X34 (Screw) ×6 IMPLANT
SCREW BONE RSP LOCK 5X18 (Screw) ×1 IMPLANT
SCREW BONE RSP LOCK 5X34 (Screw) ×2 IMPLANT
SCREW BONE RSP LOCKING 18MM LG (Screw) ×3 IMPLANT
SCREW RETAIN W/HEAD 4MM OFFSET (Shoulder) ×3 IMPLANT
SLING ULTRA II LG (MISCELLANEOUS) ×3 IMPLANT
SLING ULTRA II M (MISCELLANEOUS) IMPLANT
SPONGE GAUZE 2X2 8PLY STER LF (GAUZE/BANDAGES/DRESSINGS) ×1
SPONGE GAUZE 2X2 8PLY STRL LF (GAUZE/BANDAGES/DRESSINGS) ×2 IMPLANT
SPONGE LAP 18X18 5 PK (GAUZE/BANDAGES/DRESSINGS) ×6 IMPLANT
STAPLER SKIN PROX 35W (STAPLE) IMPLANT
STEM HUMERAL REVERSE S 12X108 (Stem) ×3 IMPLANT
STRAP SAFETY 5IN WIDE (MISCELLANEOUS) ×6 IMPLANT
STRIP CLOSURE SKIN 1/2X4 (GAUZE/BANDAGES/DRESSINGS) ×2 IMPLANT
SUT FIBERWIRE #2 38 BLUE 1/2 (SUTURE) ×12
SUT MNCRL AB 4-0 PS2 18 (SUTURE) IMPLANT
SUT PROLENE 6 0 P 1 18 (SUTURE) ×3 IMPLANT
SUT TICRON 2-0 30IN 311381 (SUTURE) ×9 IMPLANT
SUT VIC AB 0 CT1 36 (SUTURE) ×3 IMPLANT
SUT VIC AB 2-0 CT2 27 (SUTURE) ×6 IMPLANT
SUTURE FIBERWR #2 38 BLUE 1/2 (SUTURE) ×4 IMPLANT
SYR 20CC LL (SYRINGE) ×3 IMPLANT
SYR 30ML LL (SYRINGE) ×6 IMPLANT
SYR 5ML LL (SYRINGE) ×3 IMPLANT
TIP FAN IRRIG PULSAVAC PLUS (DISPOSABLE) ×1 IMPLANT
TRAY FOLEY CATH SILVER 16FR LF (SET/KITS/TRAYS/PACK) ×3 IMPLANT
WRAPON POLAR PAD SHDR UNIV (MISCELLANEOUS) ×3

## 2017-06-10 NOTE — Anesthesia Preprocedure Evaluation (Signed)
Anesthesia Evaluation  Patient identified by MRN, date of birth, ID band Patient awake    Reviewed: Allergy & Precautions, NPO status , Patient's Chart, lab work & pertinent test results, reviewed documented beta blocker date and time   Airway Mallampati: III  TM Distance: >3 FB     Dental  (+) Chipped, Upper Dentures, Partial Lower   Pulmonary shortness of breath, sleep apnea , former smoker,           Cardiovascular hypertension, Pt. on medications and Pt. on home beta blockers + Past MI, + Cardiac Stents and +CHF       Neuro/Psych  Neuromuscular disease    GI/Hepatic GERD  Controlled,  Endo/Other  diabetes, Type 2Hyperthyroidism   Renal/GU      Musculoskeletal   Abdominal   Peds  Hematology   Anesthesia Other Findings No cardiac symptoms. Does not take NTG. Gout.  Reproductive/Obstetrics                             Anesthesia Physical Anesthesia Plan  ASA: III  Anesthesia Plan: General and Regional   Post-op Pain Management:    Induction: Intravenous  PONV Risk Score and Plan:   Airway Management Planned: Oral ETT  Additional Equipment:   Intra-op Plan:   Post-operative Plan:   Informed Consent: I have reviewed the patients History and Physical, chart, labs and discussed the procedure including the risks, benefits and alternatives for the proposed anesthesia with the patient or authorized representative who has indicated his/her understanding and acceptance.     Plan Discussed with: CRNA  Anesthesia Plan Comments:         Anesthesia Quick Evaluation

## 2017-06-10 NOTE — H&P (Addendum)
Paper H&P to be scanned into permanent record. H&P reviewed. No significant changes noted.   Her symptoms began since January 2018. She notes that she did have a injury to the left shoulder back in 2000 when she fell on it. She cannot lift it currently and she does not have any significant mobility with her shoulder right now. She describes her pain is constant, sharp, aching, and stabbing. She also notes associated catching and popping sensations. Pain is located over the lateral aspect of the shoulder. She is tried injections (got 2 days of good pain relief) and pain medications as well as physical therapy and acupuncture without adequate relief of her pain. She notes that any sort of exercises and especially reaching type activities make pain significantly worse. X-rays and MRI showing a massive irreparable rotator cuff tear affecting the supraspinatous, infraspinatus and subscapularis.   General/Constitutional: NAD, conversant Eyes: Pupils equal and round, extraocular movements intact ENT: atraumatic external nose and ears, moist mucous membranes Respiratory: non-labored breathing, symmetric chest rise, chest sounds clear. Cardiovascular: no visible lower extremity edema, peripheral pulses present, regular rate and rhythm  Skin: normal skin turgor, warm and dry Neurological: cranial nerves grossly intact, sensation grossly intactPsychological: Appropriate mood and affect; appropriate judgment Musculoskeletal: as detailed below:  Comprehensive Shoulder Exam:  ROM  Right Left  Active (Passive) Forward Elevation 150 110 (130)  ER 45 30  IR T8 L4  90 degree abduction ER/IR 90/40 85/40  Crepitus no mild   Tenderness  Right Left  Diffuse anterior and superior   Inspection  Right Left  Skin Normal Normal  Scapular Kinetics  Atrophy None None   Impingement / Rotator Cuff  Right Left  Neer Impingement  Hawkins  Champagne Toast 5/5 4/5  Empty Can/Jobe's 5/5 4/5  ER Strength  5/5 4/5  Bear Hug 5/5 4/5  Belly Press Normal abnormal  Hornblower's   Neurovascular  Right Left  Distal Motor Normal Normal  Distal Sensation Normal Normal  Distal Pulse Normal Normal    Katrina Hughes is a 67 y.o. female new patient with L shoulder rotator cuff arthropathy. 1. We discussed the various treatment options including both further conservative management as well as surgical intervention. The patient has failed conservative measures including rest, medications, and corticosteroid injections. After discussion of the risks, benefits, and alternatives to surgery, the patient elected to proceed with Left reverse shoulder arthroplasty and biceps tenodesis. We did discuss that she was on higher risk of complication due to diabetes. She is also on Plavix.

## 2017-06-10 NOTE — Anesthesia Post-op Follow-up Note (Signed)
Anesthesia QCDR form completed.        

## 2017-06-10 NOTE — Anesthesia Postprocedure Evaluation (Signed)
Anesthesia Post Note  Patient: Katrina Hughes  Procedure(s) Performed: REVERSE SHOULDER ARTHROPLASTY (Left Shoulder) BICEPS TENODESIS (Left Shoulder)  Patient location during evaluation: PACU Anesthesia Type: Regional Level of consciousness: awake and alert Pain management: pain level controlled Vital Signs Assessment: post-procedure vital signs reviewed and stable Respiratory status: spontaneous breathing, nonlabored ventilation, respiratory function stable and patient connected to nasal cannula oxygen Cardiovascular status: blood pressure returned to baseline and stable Postop Assessment: no apparent nausea or vomiting Anesthetic complications: no     Last Vitals:  Vitals:   06/10/17 1400 06/10/17 1530  BP: (!) 148/68 (!) 144/91  Pulse: 62   Resp: 16 20  Temp: 37.2 C 36.6 C  SpO2: 96% 94%    Last Pain:  Vitals:   06/10/17 1531  TempSrc:   PainSc: [name]

## 2017-06-10 NOTE — Anesthesia Procedure Notes (Signed)
Anesthesia Regional Block: Interscalene brachial plexus block   Pre-Anesthetic Checklist: ,, timeout performed, Correct Patient, Correct Site, Correct Laterality, Correct Procedure, Correct Position, site marked, Risks and benefits discussed,  Surgical consent,  Pre-op evaluation,  At surgeon's request and post-op pain management   Prep: chloraprep       Needles:  Injection technique: Single-shot  Needle Type: Echogenic Stimulator Needle     Needle Length: 5cm  Needle Gauge: 21     Additional Needles:   Procedures:, nerve stimulator,,,,,,,   Nerve Stimulator or Paresthesia:  Response: biceps flexion, 0.8 mA,   Additional Responses:   Narrative:  Injection made incrementally with aspirations every 5 mL.  Performed by: Personally  Anesthesiologist: Fumie Fiallo, MD  Additional Notes: Functioning IV was confirmed and monitors were applied.  A 50mm 22ga Arrow echogenic stimulator needle was used. Sterile prep and drape,hand hygiene and sterile gloves were used.  Negative aspiration and negative test dose prior to incremental administration of local anesthetic. The patient tolerated the procedure well.  Ultrasound guidance: relevent anatomy identified, needle position confirmed, local anesthetic spread visualized around nerve(s), vascular puncture avoided.  Image printed for medical record.       

## 2017-06-10 NOTE — Anesthesia Procedure Notes (Signed)
Procedure Name: Intubation Date/Time: 06/10/2017 7:46 AM Performed by: Aline Brochure, CRNA Pre-anesthesia Checklist: Patient identified, Emergency Drugs available, Suction available and Patient being monitored Patient Re-evaluated:Patient Re-evaluated prior to induction Oxygen Delivery Method: Circle system utilized Preoxygenation: Pre-oxygenation with 100% oxygen Induction Type: IV induction Ventilation: Mask ventilation without difficulty Laryngoscope Size: Mac and 3 Grade View: Grade I Tube type: Oral Tube size: 7.0 mm Number of attempts: 1 Airway Equipment and Method: Stylet Placement Confirmation: ETT inserted through vocal cords under direct vision,  positive ETCO2 and breath sounds checked- equal and bilateral Secured at: 21 cm Tube secured with: Tape Dental Injury: Teeth and Oropharynx as per pre-operative assessment

## 2017-06-10 NOTE — Progress Notes (Signed)
Physical Therapy Evaluation Patient Details Name: Katrina Hughes MRN: [id number] DOB: Aug 17, 1950 Today's Date: 06/10/2017   History of Present Illness  Pt underwent L reverse TSR with L bicep tenodesis without reported post-op complications. PMH includes OSA, DM, hyperthyroidism, HTN, MI, CHF, GERD, and cardiac stent  Clinical Impression  Pt admitted with above diagnosis. Pt currently with functional limitations due to the deficits listed below (see PT Problem List).  Pt requiring minA+1 assist for bed mobility to pull up with RUE. Steady in sitting. Pt demonstrates safe hand placement. She is steady in standing with single point cane. Denies dizziness/lightheadedness in standing. Pt is able to ambulate partially around RN station with therapy. She takes short steps but is steady with use of single point cane. No DOE and no signs of respiratory distress. Pt still with numbness in LUE from nerve block. Pt will be instructed in donning/doffing shoulder immobilizer and polar care tomorrow. Will instruct pt in HEP, perform stairs, and progress ambulation. Pt will benefit from PT services to address deficits in strength, balance, and mobility in order to return to full function at home.      Follow Up Recommendations Home health PT    Equipment Recommendations  None recommended by PT    Recommendations for Other Services OT consult     Precautions / Restrictions Precautions Precautions: Fall;Shoulder Type of Shoulder Precautions: PROM L elbow flexion/extension. AROM wrist/grip. PROM L shoulder 90 FF and 40 ER. Pt to be instructed in pendulums Shoulder Interventions: Shoulder sling/immobilizer;Shoulder abduction pillow Precaution Booklet Issued: Yes (comment) Required Braces or Orthoses: Sling Restrictions Weight Bearing Restrictions: Yes LUE Weight Bearing: Non weight bearing      Mobility  Bed Mobility Overal bed mobility: Needs Assistance Bed Mobility: Supine to Sit     Supine to  sit: Min assist     General bed mobility comments: Pt requires minA+1 to pull up to sitting. She is stable once upright at EOB. L shoulder immobilizer readjusted for improved support  Transfers Overall transfer level: Modified independent Equipment used: Straight cane             General transfer comment: Pt demonstrates safe hand placement. She is steady in standing with single point cane. Denies dizziness/lightheadedness in standing  Ambulation/Gait Ambulation/Gait assistance: Min guard Ambulation Distance (Feet): 140 Feet Assistive device: Straight cane Gait Pattern/deviations: Decreased step length - right;Decreased step length - left Gait velocity: Decreased Gait velocity interpretation: <1.8 ft/sec, indicative of risk for recurrent falls General Gait Details: Pt is able to ambulate partially around RN station with therapy. She takes short steps but is steady with use of single point cane. No DOE and no signs of respiratory distress.   Stairs            Wheelchair Mobility    Modified Rankin (Stroke Patients Only)       Balance Overall balance assessment: Needs assistance Sitting-balance support: No upper extremity supported Sitting balance-Leahy Scale: Good     Standing balance support: Single extremity supported Standing balance-Leahy Scale: Fair                               Pertinent Vitals/Pain Pain Assessment: No/denies pain    Home Living Family/patient expects to be discharged to:: Private residence Living Arrangements: Spouse/significant other Available Help at Discharge: Family Type of Home: Mobile home Home Access: Ramped entrance     Home Layout: One level Home Equipment: Environmental consultant - 2  wheels;Cane - single point;Bedside commode;Shower seat - built in;Electric scooter;Other (comment)(CPAP)      Prior Function Level of Independence: Needs assistance   Gait / Transfers Assistance Needed: Ambulates with single point  cane.  ADL's / Homemaking Assistance Needed: Independent with ADLs, HH aid 5d/wk        Hand Dominance   Dominant Hand: Right    Extremity/Trunk Assessment   Upper Extremity Assessment Upper Extremity Assessment: LUE deficits/detail LUE Deficits / Details: LUE in immobilizer. Reports numbness in L hand to light touch sensation. Nerve block still affecting LUE sensation and strength    Lower Extremity Assessment Lower Extremity Assessment: Overall WFL for tasks assessed       Communication   Communication: No difficulties  Cognition Arousal/Alertness: Awake/alert Behavior During Therapy: WFL for tasks assessed/performed Overall Cognitive Status: Within Functional Limits for tasks assessed                                        General Comments      Exercises     Assessment/Plan    PT Assessment Patient needs continued PT services  PT Problem List Decreased strength;Decreased balance;Decreased range of motion;Decreased mobility;Pain       PT Treatment Interventions DME instruction;Gait training;Stair training;Functional mobility training;Therapeutic activities;Therapeutic exercise;Balance training;Neuromuscular re-education;Patient/family education;Manual techniques    PT Goals (Current goals can be found in the Care Plan section)  Acute Rehab PT Goals Patient Stated Goal: Return to prior level of function PT Goal Formulation: With patient/family Time For Goal Achievement: 06/24/17 Potential to Achieve Goals: Good    Frequency BID   Barriers to discharge        Co-evaluation               AM-PAC PT "6 Clicks" Daily Activity  Outcome Measure Difficulty turning over in bed (including adjusting bedclothes, sheets and blankets)?: Unable Difficulty moving from lying on back to sitting on the side of the bed? : Unable Difficulty sitting down on and standing up from a chair with arms (e.g., wheelchair, bedside commode, etc,.)?: A  Little Help needed moving to and from a bed to chair (including a wheelchair)?: A Little Help needed walking in hospital room?: A Little Help needed climbing 3-5 steps with a railing? : A Little 6 Click Score: 14    End of Session Equipment Utilized During Treatment: Gait belt Activity Tolerance: Patient tolerated treatment well Patient left: in chair;with call bell/phone within reach;with chair alarm set;with SCD's reapplied;Other (comment)(pillow under LUE for support) Nurse Communication: Mobility status PT Visit Diagnosis: Other abnormalities of gait and mobility (R26.89);Muscle weakness (generalized) (M62.81);Difficulty in walking, not elsewhere classified (R26.2)    Time: 0981-1914 PT Time Calculation (min) (ACUTE ONLY): 28 min   Charges:   PT Evaluation $PT Eval Low Complexity: 1 Low PT Treatments $Gait Training: 8-22 mins   PT G Codes:        Lyndel Safe Solaris Kram PT, DPT    Jeremiah Tarpley 06/10/2017, 5:28 PM

## 2017-06-10 NOTE — Op Note (Signed)
Operative Note   SURGERY DATE: 06/10/2017  PRE-OP DIAGNOSIS:  1. Left shoulder rotator cuff arthropathy 2. Left proximal biceps rupture  POST-OP DIAGNOSIS: 1. Left shoulder rotator cuff arthropathy 2. Left proximal biceps rupture  PROCEDURES:  1. Left reverse total shoulder arthroplasty 2. Left biceps tenodesis  SURGEON: Cato Mulligan, MD  ASSISTANTS: Reche Dixon, PA  ANESTHESIA: Gen + interscalene block  ESTIMATED BLOOD LOSS: 200cc  TOTAL IV FLUIDS: see anesthesia record  IMPLANTS: [employer]: RSP Glenoid Head w/Retaining screw 32-4; Monoblock Reverse Shoulder Baseplate with 1.6XW central screw; 3 locking screws into baseplate (96EA, 54UJ, 81XB); Small Shell Humeral Stem 12 x175m; Neutral Small Socket Insert; Bio-absorbable cement restrictor (128m.  INDICATION(S):  Katrina Hughes a [age].o. female with chronic shoulder pain with inability to lift arm overhead. Imaging consistent with massive, irreparable rotator cuff tear. Conservative measures including medications and cortisone injections have not provided adequate relief. After discussion of risks, benefits, and alternatives to surgery, the patient elected to proceed with reverse shoulder arthroplasty and biceps tenodesis.  OPERATIVE FINDINGS: massive rotator cuff tear (complete supraspinatus, partial infraspinatus, partial subscapularis), proximal biceps rupture  OPERATIVE REPORT:  I identified Katrina Hughes the pre-operative holding area. Informed consent was obtained and the surgical site was marked. I reviewed the risks and benefits of the proposed surgical intervention and the patient (and/or patient's guardian) wished to proceed. An interscalene block was administered by the Anesthesia team. The patient was transferred to the operative suite and general anesthesia was administered. The patient was placed in the beach chair position with the head of the bed elevated approximately 45 degrees. All down  side pressure points were appropriately padded. Pre-op exam under anesthesia confirmed some stiffness and crepitus. Appropriate IV antibioticswere administered. Tranexamic acid was also administered after verifying that the patient had no contraindications. The extremity was then prepped and draped in standard fashion. A time out was performed confirming the correct extremity, correct patient, and correct procedure.   We used the standard deltopectoral incision from the coracoid to ~12cm distal. We found the cephalic vein and took it laterally. We opened the deltopectoral interval widely and placed retractors under the CA ligament in the subacromial space and under the deltoid tendon at its insertion. We then abducted and internally rotated the arm and released the underlying bursa between these retractors, taking care not to damage the circumflex branch of the axillary nerve.   Next, we brought the arm back in adduction at slight forward flexion with external rotation. We opened the clavipectoral fascia lateral to the conjoint tendon. We gently palpated the axillary nerve and verified its position and continuity on both sides of the humerus with a Tug test. Note, this test was repeated multiple times during the procedure for nerve localization and confirmed to be intact at the end of the case. We then cauterized the anterior humeral circumflex ("Three sisters") vessels. The arm was then internally rotated, we cut the falciform ligament at approximately 1 cm of the upper portion of the pectoralis major insertion. Next we unroofed the bicipital groove. The biceps tendon had already been ruptured proximally. We proceeded with a soft tissue biceps tenodesis given the pathology of the tendon.  After opening the biceps tendon sheath all the way to the supraglenoid tubercle, we performed a biceps tenodesis with two #2 TiCron sutures to the upper border of the pectoralis major. The proximal portion of the tendon was  excised.   At this point, we could see that the supraspinatus  was completely torn with a bald humeral head superiorly. The subscapularis also had a partial tear of the superior fibers. We performed a subscapularis peel using electrocautery to remove the anterior capsule and subscapularis off of the humeral head. We released the inferior capsule from the humerus all the way to the posterior band of the inferior glenohumeral ligament. When this was complete we gently dislocated the shoulder up into the wound. We removed any osteophytes and made our cut with the appropriate inclination in 30 degrees of retroversion  We then turned our attention back to the glenoid. The cepahlic vein was noted to have a tear in it. It was not repairable so it was coagulated. The proximal humerus was retracted posteriorly. The anterior capsule was dissected free from the subscapularis. The anterior capsule was then excised, exposing the anterior glenoid. We then grasped the labrum and removed it circumferentially. During the glenoid exposure, the axillary nerve was protected the entire time.    A patient-specific guide was used to drill the central guidepin. A tap was placed, matching the trajectory of the guidepin. An appropriately sized reamer was used to ream the glenoid. The monoblock baseplate was inserted and excellent fixation was achieved such that the entire scapula rotated with further attempted seating of the baseplate. The peripheral screws were drilled, measured, and placed. The posterior screw was not placed due to difficulty in obtaining appropriate trajectory. A 32-4 glenosphere was then placed and tightened.   We then turned our attention back to the humerus. We sized for a small-shell prosthesis. We sequentially used larger diameter canal finders until we met significant resistance and sequential broaching was performed to this size listed above. Stem did not have appropriate seating so we elected to use cement  for stem fixation. We trialed both a 0 and +4 poly. The humerus was trialed and noted to have satisfactory stability, motion, and deltoid tension with a 0 poly. We drilled 2 holes and passed #2 Fiberwire sutures through for our subscapularis repair. 24m cement restrictor was placed. Canal was irrigated and dried. Cement was placed into the canal. The final humeral component was then inserted and held in appropriate position until cement hardened. Excess cement was removed. Bone graft was used to fill the void between the greater tuberosity and implant. We placed an 0 poly. The humerus was reduced and final confirmation of motion, tension, and stability were satisfactory. A Hemovac drain was placed. Subscapularis was repaired with the previously passed #2 FiberWire sutures after passing them through the subscapularis.   We again verified the tension on the axillary nerve, appropriate range of motion, stability of the implant, and security of the subscapularis repair. We closed the deltopectoral interval deep to the cephalic vein with a running, 0-Vicryl suture. The skin was closed with 2-0 Vicryl and staples. Xeroform and Honeycomb dressing was applied. A PolarCare unit and sling were placed. Patient was extubated, transferred to a stretcher bed and to the post antesthesia care unit in stable condition.   Of note, services of a PA were essential to performing the surgery. PA was able to assist in patient positioning, exposure, retraction, and suturing the wound.    POSTOPERATIVE PLAN: The patient will be admitted with plan for discharge home on POD#1. Operative arm to remain in sling at all times except RoM exercises and hygiene. Can perform pendulums, elbow/wrist/hand RoM exercises. Passive RoM allowed to 90 FF and 30 ER. ASA '325mg'$  x 6 weeks for DVT ppx. Plan for outpatient PT starting  on POD #3-4. Patient to return to clinic in ~2 weeks for post-operative appointment.

## 2017-06-10 NOTE — Transfer of Care (Signed)
Immediate Anesthesia Transfer of Care Note  Patient: Katrina Hughes  Procedure(s) Performed: REVERSE SHOULDER ARTHROPLASTY (Left Shoulder) BICEPS TENODESIS (Left Shoulder)  Patient Location: PACU  Anesthesia Type:General  Level of Consciousness: awake  Airway & Oxygen Therapy: Patient connected to face mask oxygen  Post-op Assessment: Post -op Vital signs reviewed and stable  Post vital signs: stable  Last Vitals:  Vitals:   06/10/17 0715 06/10/17 1225  BP: (!) 153/95 (!) 162/104  Pulse: (!) 54 78  Resp: 19   Temp:    SpO2: 97% 98%    Last Pain:  Vitals:   06/10/17 0710  TempSrc:   PainSc: 0-No pain         Complications: No apparent anesthesia complications

## 2017-06-10 NOTE — Progress Notes (Signed)
Patient admitted to room. Alert and oriented x 4. Shoulder immobilizer and polar care in place. Honeycomb dressing clean, dry intact. Patient denies pain. Pt able to move left arm fingers.  Patient and family educated about room and call system. Bed in lowest position and bed alarm on.

## 2017-06-11 LAB — BASIC METABOLIC PANEL
ANION GAP: 9 (ref 5–15)
BUN: 19 mg/dL (ref 6–20)
CHLORIDE: 108 mmol/L (ref 101–111)
CO2: 19 mmol/L — AB (ref 22–32)
Calcium: 9.1 mg/dL (ref 8.9–10.3)
Creatinine, Ser: 2.06 mg/dL — ABNORMAL HIGH (ref 0.44–1.00)
GFR calc Af Amer: 28 mL/min — ABNORMAL LOW (ref >60)
GFR calc non Af Amer: 24 mL/min — ABNORMAL LOW (ref >60)
GLUCOSE: 167 mg/dL — AB (ref 65–99)
POTASSIUM: 6.5 mmol/L — AB (ref 3.5–5.1)
Sodium: 136 mmol/L (ref 135–145)

## 2017-06-11 LAB — CBC
HEMATOCRIT: 37.9 % (ref 35.0–47.0)
HEMOGLOBIN: 12.1 g/dL (ref 12.0–16.0)
MCH: 29.9 pg (ref 26.0–34.0)
MCHC: 32.1 g/dL (ref 32.0–36.0)
MCV: 93.4 fL (ref 80.0–100.0)
PLATELETS: 191 10*3/uL (ref 150–440)
RBC: 4.06 MIL/uL (ref 3.80–5.20)
RDW: 14.6 % — ABNORMAL HIGH (ref 11.5–14.5)
WBC: 15.1 10*3/uL — ABNORMAL HIGH (ref 3.6–11.0)

## 2017-06-11 LAB — GLUCOSE, CAPILLARY
GLUCOSE-CAPILLARY: 144 mg/dL — AB (ref 65–99)
GLUCOSE-CAPILLARY: 142 mg/dL — AB (ref 65–99)

## 2017-06-11 LAB — POTASSIUM: POTASSIUM: 5 mmol/L (ref 3.5–5.1)

## 2017-06-11 MED ORDER — OXYCODONE HCL 5 MG PO TABS: 5.0000 mg | ORAL_TABLET | ORAL | 0 refills | Status: DC | PRN

## 2017-06-11 MED ORDER — ASPIRIN 325 MG PO TBEC: 325.0000 mg | DELAYED_RELEASE_TABLET | Freq: Every day | ORAL | 0 refills | Status: DC

## 2017-06-11 MED ORDER — ONDANSETRON HCL 4 MG PO TABS: 4.0000 mg | ORAL_TABLET | Freq: Four times a day (QID) | ORAL | 0 refills | Status: DC | PRN

## 2017-06-11 NOTE — Progress Notes (Signed)
Discharge instructions and med details reviewed with patient and family. All questions answered. Patient verbalizes understanding. Printed prescriptions given to patient along with AVS. IV removed. Patient aware of follow up appointments.  Patient was escorted out via wheelchair.

## 2017-06-11 NOTE — Progress Notes (Signed)
OT Cancellation Note  Patient Details Name: Reeta Kuk MRN: [id number] DOB: 02/03/1951   Cancelled Treatment:    Reason Eval/Treat Not Completed: Medical issues which prohibited therapy. Medical issues which prohibited therapy(K+ 6.5).  Will hold OT eval until pt more medically appropriate to participate.Thank you for the referral.  Chrys Racer, OTR/L ascom [phone] 06/11/17, 8:33 AM

## 2017-06-11 NOTE — Progress Notes (Signed)
  Subjective: 1 Day Post-Op Procedure(s) (LRB): REVERSE SHOULDER ARTHROPLASTY (Left) BICEPS TENODESIS (Left) Patient reports pain as moderate.   Patient seen in rounds with Dr. Posey Pronto. Patient is well, and has had no acute complaints or problems Plan is to go Home after hospital stay. Negative for chest pain and shortness of breath Fever: no Gastrointestinal: Negative for nausea and vomiting  Objective: Vital signs in last 24 hours: Temp:  [96.8 F (36 C)-98.9 F (37.2 C)] 98 F (36.7 C) (03/05 0452) Pulse Rate:  [52-100] 93 (03/05 0452) Resp:  [10-20] 18 (03/05 0452) BP: (125-171)/(68-104) 128/85 (03/05 0452) SpO2:  [94 %-100 %] 100 % (03/05 0452) Weight:  [95 kg (209 lb 7 oz)] 95 kg (209 lb 7 oz) (03/04 1649)  Intake/Output from previous day:  Intake/Output Summary (Last 24 hours) at 06/11/2017 0646 Last data filed at 06/11/2017 0639 Gross per 24 hour  Intake 3362.5 ml  Output 1900 ml  Net 1462.5 ml    Intake/Output this shift: Total I/O In: 1036.3 [I.V.:836.3; IV Piggyback:200] Out: 1230 [Urine:1000; Drains:230]  Labs: Recent Labs    06/11/17 0348  HGB 12.1   Recent Labs    06/11/17 0348  WBC 15.1*  RBC 4.06  HCT 37.9  PLT 191   Recent Labs    06/11/17 0348  NA 136  K 6.5*  CL 108  CO2 19*  BUN 19  CREATININE 2.06*  GLUCOSE 167*  CALCIUM 9.1   No results for input(s): LABPT, INR in the last 72 hours.   EXAM General - Patient is Alert and Oriented Extremity - Neurovascular intact Sensation intact distally Dorsiflexion/Plantar flexion intact Dressing/Incision - clean, dry, no drainage, with the Hemovac removed Motor Function - intact, moving fingers and wrist well on exam. Deltoid function is intact.  Past Medical History:  Diagnosis Date  . Bell palsy   . CHF (congestive heart failure) ([place])   . Complication of anesthesia    slow to wake up  . Diabetes mellitus without complication ([place])   . Dyspnea   . Environmental and seasonal  allergies   . GERD (gastroesophageal reflux disease)   . Glaucoma   . Hypertension   . Hyperthyroidism   . Myocardial infarction ([place])   . Sleep apnea     Assessment/Plan: 1 Day Post-Op Procedure(s) (LRB): REVERSE SHOULDER ARTHROPLASTY (Left) BICEPS TENODESIS (Left) Active Problems:   Status post shoulder replacement  Estimated body mass index is 34.85 kg/m as calculated from the following:   Height as of this encounter: 5\' 5"  (1.651 m).   Weight as of this encounter: 95 kg (209 lb 7 oz). Advance diet Up with therapy D/C IV fluids  Discharge home after physical therapy today.  DVT Prophylaxis - Aspirin Shoulder immobilizer sling on her left arm  Reche Dixon, PA-C Orthopaedic Surgery 06/11/2017, 6:46 AM

## 2017-06-11 NOTE — Evaluation (Signed)
Occupational Therapy Evaluation Patient Details Name: Katrina Hughes MRN: [id number] DOB: January 17, 1951 Today's Date: 06/11/2017    History of Present Illness Pt underwent L reverse TSR with L bicep tenodesis without reported post-op complications. PMH includes OSA, DM, hyperthyroidism, HTN, MI, CHF, GERD, and cardiac stent   Clinical Impression    Patient was evaluated this date by OT, she lives at home with her wife in a mobile home. Patient was independent in ADLs and used a cane for mobility.  She has a BSC over her toilet and educated in set up at home to prevent falls.  She has a lift recliner that she plans to eat meals at and her wife plans to remove all throw rugs in the house.  Patient and her wife, sister and daughter were all educated in proper position of LUE immobilizer/sling, ice pack and encouraged to take pictures on proper positon.  Patient has orders for L UE immobilized and will be non WB per MD. Patient presents with decreased strength, decreased ability to perform self care tasks and will need further instructions for managing daily self care tasks with use of one hand only. Patient will benefit from skilled OT care to address these limitations and improve independence in daily tasks to return home with family. Rec continued rehab at outpatient facility by either OT or PT for shoulder rehab and proper exercises per Dr Prescott Parma.  Pt's wife states that she has an appointment on Friday at outpatient facility.    Follow Up Recommendations  Outpatient OT(Ot or PT for follow up for shoulder rehab)    Equipment Recommendations       Recommendations for Other Services       Precautions / Restrictions Precautions Precautions: Fall;Shoulder Type of Shoulder Precautions: PROM L elbow flexion/extension. AROM wrist/grip. PROM L shoulder 90 FF and 40 ER. Pt to be instructed in pendulums. Shoulder Interventions: Shoulder sling/immobilizer;Shoulder abduction pillow Precaution Booklet  Issued: Yes (comment) Required Braces or Orthoses: Sling Restrictions Weight Bearing Restrictions: Yes LUE Weight Bearing: Non weight bearing      Mobility Bed Mobility               General bed mobility comments: Pt sitting in chair at start and end of session.  Reviewed that if she has the option then she should get OOB on the R side.  Pt is planning to sleep in her recliner at d/c.   Transfers Overall transfer level: Modified independent Equipment used: Straight cane             General transfer comment: Pt demonstrates safe hand placement. She is steady in standing with single point cane. On first stand>sit the pt demonstrates poorly controlled descent to sit.  Had pt perform this again with control to avoid injuring LUE.     Balance Overall balance assessment: Needs assistance Sitting-balance support: No upper extremity supported Sitting balance-Leahy Scale: Good     Standing balance support: Single extremity supported Standing balance-Leahy Scale: Fair Standing balance comment: Pt able to stand statically without UE support but uses SPC to remain steady when ambulating.                            ADL either performed or assessed with clinical judgement   ADL Overall ADL's : Needs assistance/impaired Eating/Feeding: Set up;Minimal assistance;Sitting Eating/Feeding Details (indicate cue type and reason): to open containers and cut meat Grooming: Wash/dry hands;Wash/dry face;Oral care;Set up;Sitting;Minimal assistance  Upper Body Dressing : Total assistance;Set up Upper Body Dressing Details (indicate cue type and reason): max assist for LUE due to pain and in sling at all times except when dressing.  Educated on proper position to prevent pain and pulling at shoulder joint.  Had pt's wife take pictures of how ice pack is placed and how sling is placed and where buckles and velcro attach to for proper support.      Toilet Transfer: Minimal  assistance;Set up                   Vision Patient Visual Report: No change from baseline       Perception     Praxis      Pertinent Vitals/Pain Pain Assessment: 0-10 Pain Score: 7  Pain Location: L shoulder Pain Descriptors / Indicators: Aching;Grimacing Pain Intervention(s): Limited activity within patient's tolerance;Monitored during session;Premedicated before session;Repositioned;Ice applied     Hand Dominance Right   Extremity/Trunk Assessment Upper Extremity Assessment Upper Extremity Assessment: LUE deficits/detail LUE Deficits / Details: LUE in immobilizer. Reports numbness in L hand has gone away.  Rec AROM in L hand and wrist to tolerance  LUE: Unable to fully assess due to immobilization LUE Sensation: WNL LUE Coordination: decreased fine motor;decreased gross motor   Lower Extremity Assessment Lower Extremity Assessment: Defer to PT evaluation       Communication Communication Communication: No difficulties   Cognition Arousal/Alertness: Awake/alert Behavior During Therapy: WFL for tasks assessed/performed Overall Cognitive Status: Within Functional Limits for tasks assessed                                     General Comments  Reviewed proper positioning of sling when in standing or sitting with pillow support.  Pt reported neck pain so she was instructed in cervical AROM exercises to be completed as part of her HEP.      Exercises Exercises: Other exercises Other Exercises Other Exercises: Seated cervical AROM rotation x10 each direction (added to HEP) Other Exercises: Seated cervical AROM flexion and extension x10 each direction (added to HEP) Other Exercises: Once pt has returned home she was instructed to ambulate in home every hour.    Shoulder Instructions      Home Living Family/patient expects to be discharged to:: Private residence Living Arrangements: Spouse/significant other Available Help at Discharge:  Family Type of Home: Mobile home Home Access: [place]: One level     Bathroom Shower/Tub: Occupational psychologist: Standard Bathroom Accessibility: No   Home Equipment: Environmental consultant - 2 wheels;Cane - single point;Bedside commode;Shower seat - built in;Electric scooter;Other (comment)          Prior Functioning/Environment Level of Independence: Needs assistance  Gait / Transfers Assistance Needed: Ambulates with single point cane. ADL's / Homemaking Assistance Needed: Independent with ADLs, HH aid 5d/wk            OT Problem List: Decreased strength;Decreased range of motion;Decreased activity tolerance;Pain;Impaired UE functional use      OT Treatment/Interventions: Self-care/ADL training;Therapeutic exercise    OT Goals(Current goals can be found in the care plan section) Acute Rehab OT Goals Patient Stated Goal: Return to doing more for myself again OT Goal Formulation: With patient/family Time For Goal Achievement: 06/25/17 Potential to Achieve Goals: Good ADL Goals Pt Will Perform Upper Body Dressing: with mod assist;with supervision;with set-up;with caregiver independent  in assisting;sitting Pt Will Perform Lower Body Dressing: with min assist;with set-up;with caregiver independent in assisting;sit to/from stand Pt Will Transfer to Toilet: with supervision;with set-up;stand pivot transfer  OT Frequency: Min 1X/week   Barriers to D/C:            Co-evaluation              AM-PAC PT "6 Clicks" Daily Activity     Outcome Measure Help from another person eating meals?: A Little Help from another person taking care of personal grooming?: A Little Help from another person toileting, which includes using toliet, bedpan, or urinal?: A Little Help from another person bathing (including washing, rinsing, drying)?: A Lot Help from another person to put on and taking off regular upper body clothing?: A Lot Help from another person to  put on and taking off regular lower body clothing?: A Little 6 Click Score: 16   End of Session Equipment Utilized During Treatment: Other (comment)(LUE sling with abduction pillow and ice pack ) Nurse Communication: Other (comment)(NSG informed that pt was dressed and ready to go home)  Activity Tolerance: Patient limited by pain Patient left: in bed;with family/visitor present;with chair alarm set  OT Visit Diagnosis: Pain;Muscle weakness (generalized) (M62.81) Pain - Right/Left: Left Pain - part of body: Shoulder                Time: 1315-1350 OT Time Calculation (min): 35 min Charges:  OT General Charges $OT Visit: 1 Visit OT Evaluation $OT Eval Low Complexity: 1 Low OT Treatments $Self Care/Home Management : 8-22 mins G-Codes: OT G-codes **NOT FOR INPATIENT CLASS** Functional Assessment Tool Used: AM-PAC 6 Clicks Daily Activity;Clinical judgement   Chrys Racer, OTR/L ascom [phone] 06/11/17, 2:13 PM

## 2017-06-11 NOTE — Progress Notes (Signed)
Notified Dr. Leim Fabry that patient's potassium is 6.5. Order received for stat re-draw and place patient on telemetry

## 2017-06-11 NOTE — Progress Notes (Signed)
Physical Therapy Treatment Patient Details Name: Katrina Hughes MRN: [id number] DOB: [birth year] Today's Date: 06/11/2017    History of Present Illness Pt underwent L reverse TSR with L bicep tenodesis without reported post-op complications. PMH includes OSA, DM, hyperthyroidism, HTN, MI, CHF, GERD, and cardiac stent    PT Comments    Katrina Hughes made good progress with mobility this session.  Pt ambulated 160 ft using SPC with improved stability this session.  Pt requires cues for controlled descent when sitting.  Pt performed cervical AROM exercises as pt was reporting neck pain (added these to her HEP).  Pt will benefit from an OT Evaluation prior to d/c.     Follow Up Recommendations  Home health PT     Equipment Recommendations  None recommended by PT    Recommendations for Other Services OT consult     Precautions / Restrictions Precautions Precautions: Fall;Shoulder Type of Shoulder Precautions: PROM L elbow flexion/extension. AROM wrist/grip. PROM L shoulder 90 FF and 40 ER. Pt to be instructed in pendulums Shoulder Interventions: Shoulder sling/immobilizer;Shoulder abduction pillow Precaution Booklet Issued: Yes (comment) Required Braces or Orthoses: Sling Restrictions Weight Bearing Restrictions: Yes LUE Weight Bearing: Non weight bearing    Mobility  Bed Mobility               General bed mobility comments: Pt sitting in chair at start and end of session.  Reviewed that if she has the option then she should get OOB on the R side.  Pt is planning to sleep in her recliner at d/c.   Transfers Overall transfer level: Modified independent Equipment used: Straight cane             General transfer comment: Pt demonstrates safe hand placement. She is steady in standing with single point cane. On first stand>sit the pt demonstrates poorly controlled descent to sit.  Had pt perform this again with control to avoid injuring LUE.    Ambulation/Gait Ambulation/Gait assistance: Supervision Ambulation Distance (Feet): 160 Feet Assistive device: Straight cane Gait Pattern/deviations: Decreased step length - right;Decreased step length - left Gait velocity: Decreased Gait velocity interpretation: Below normal speed for age/gender General Gait Details: Cues provided to avoid running into doorframe when ambulating into bathroom.  Pt with slower but steady gait using SPC.    Stairs            Wheelchair Mobility    Modified Rankin (Stroke Patients Only)       Balance Overall balance assessment: Needs assistance Sitting-balance support: No upper extremity supported Sitting balance-Leahy Scale: Good     Standing balance support: Single extremity supported Standing balance-Leahy Scale: Fair Standing balance comment: Pt able to stand statically without UE support but uses SPC to remain steady when ambulating.                             Cognition Arousal/Alertness: Awake/alert Behavior During Therapy: WFL for tasks assessed/performed Overall Cognitive Status: Within Functional Limits for tasks assessed                                        Exercises Other Exercises Other Exercises: Seated cervical AROM rotation x10 each direction (added to HEP) Other Exercises: Seated cervical AROM flexion and extension x10 each direction (added to HEP) Other Exercises: Once pt has returned home she was instructed to ambulate  in home every hour.     General Comments General comments (skin integrity, edema, etc.): Reviewed proper positioning of sling when in standing or sitting with pillow support.  Pt reported neck pain so she was instructed in cervical AROM exercises to be completed as part of her HEP.        Pertinent Vitals/Pain Pain Assessment: 0-10 Pain Score: 6  Pain Location: L shoulder Pain Descriptors / Indicators: Aching;Grimacing Pain Intervention(s): Limited activity within  patient's tolerance;Monitored during session;Premedicated before session;Ice applied    Home Living                      Prior Function            PT Goals (current goals can now be found in the care plan section) Acute Rehab PT Goals Patient Stated Goal: Return to prior level of function PT Goal Formulation: With patient/family Time For Goal Achievement: 06/24/17 Potential to Achieve Goals: Good Progress towards PT goals: Progressing toward goals    Frequency    BID      PT Plan Current plan remains appropriate    Co-evaluation              AM-PAC PT "6 Clicks" Daily Activity  Outcome Measure  Difficulty turning over in bed (including adjusting bedclothes, sheets and blankets)?: Unable Difficulty moving from lying on back to sitting on the side of the bed? : Unable Difficulty sitting down on and standing up from a chair with arms (e.g., wheelchair, bedside commode, etc,.)?: A Little Help needed moving to and from a bed to chair (including a wheelchair)?: A Little Help needed walking in hospital room?: A Little Help needed climbing 3-5 steps with a railing? : A Little 6 Click Score: 14    End of Session Equipment Utilized During Treatment: Gait belt Activity Tolerance: Patient tolerated treatment well Patient left: in chair;with call bell/phone within reach;with chair alarm set;Other (comment);with family/visitor present(pillow under LUE for support; polar care) Nurse Communication: Mobility status PT Visit Diagnosis: Other abnormalities of gait and mobility (R26.89);Muscle weakness (generalized) (M62.81);Difficulty in walking, not elsewhere classified (R26.2)     Time: 9407-6808 PT Time Calculation (min) (ACUTE ONLY): 29 min  Charges:  $Gait Training: 23-37 mins                    G Codes:       Collie Siad PT, DPT 06/11/2017, 1:20 PM

## 2017-06-11 NOTE — Discharge Summary (Signed)
Physician Discharge Summary  Subjective: 1 Day Post-Op Procedure(s) (LRB): REVERSE SHOULDER ARTHROPLASTY (Left) BICEPS TENODESIS (Left) Patient reports pain as moderate.   Patient seen in rounds with Dr. Posey Pronto. Patient is well, and has had no acute complaints or problems Patient is ready to go home after physical therapy this morning.  Physician Discharge Summary  Patient ID: Katrina Hughes MRN: [id number] DOB/AGE: 04-11-1950 67 y.o.  Admit date: 06/10/2017 Discharge date: 06/11/2017  Admission Diagnoses:  Discharge Diagnoses:  Active Problems:   Status post shoulder replacement   Discharged Condition: fair  Hospital Course: The patient is postop day 1 from a left reverse total shoulder replacement. She has done well since surgery. Her anesthesia is wearing off this morning. She ambulated 140 feet with physical therapy yesterday and did passive range of motion with her shoulder. She is ready to go home.  Treatments: surgery:  1. Left reverse total shoulder arthroplasty 2. Left biceps tenodesis  SURGEON: Cato Mulligan, MD  ASSISTANTS: Reche Dixon, PA  ANESTHESIA: Gen + interscalene block  ESTIMATED BLOOD LOSS: 200cc  TOTAL IV FLUIDS: see anesthesia record  IMPLANTS: [employer] Surgical: RSP Glenoid Head w/Retaining screw 32-4; Monoblock Reverse Shoulder Baseplate with 7.2IO central screw; 3 locking screws into baseplate (03TD, 97CB, 63AG); Small Shell Humeral Stem 12 x161mm; Neutral Small Socket Insert; Bio-absorbable cement restrictor (91mm).    Discharge Exam: Blood pressure 128/85, pulse 93, temperature 98 F (36.7 C), temperature source Oral, resp. rate 18, height 5\' 5"  (1.651 m), weight 95 kg (209 lb 7 oz), SpO2 100 %.   Disposition: Final discharge disposition not confirmed   Allergies as of 06/11/2017      Reactions   Sulfur Other (See Comments)   Sulfur based products   Aspirin Other (See Comments)   Ulcers in stomach      Medication List    STOP  taking these medications   HYDROcodone-acetaminophen 5-325 MG tablet Commonly known as:  NORCO/VICODIN   potassium chloride SA 20 MEQ tablet Commonly known as:  K-DUR,KLOR-CON     TAKE these medications   acetaminophen 650 MG CR tablet Commonly known as:  TYLENOL Take 1,300 mg by mouth 2 (two) times daily.   allopurinol 100 MG tablet Commonly known as:  ZYLOPRIM Take 200 mg by mouth 2 (two) times daily.   ARTIFICIAL TEAR SOLUTION OP Apply 1 drop to eye 4 (four) times daily as needed (dry eyes).   aspirin 325 MG EC tablet Take 1 tablet (325 mg total) by mouth daily.   atorvastatin 80 MG tablet Commonly known as:  LIPITOR Take 40 mg by mouth at bedtime.   calcitRIOL 0.25 MCG capsule Commonly known as:  ROCALTROL Take 0.25 mcg by mouth 2 (two) times daily.   carvedilol 12.5 MG tablet Commonly known as:  COREG Take 6.25 mg by mouth 2 (two) times daily with a meal.   cetirizine 10 MG tablet Commonly known as:  ZYRTEC Take 10 mg by mouth daily.   clopidogrel 75 MG tablet Commonly known as:  PLAVIX Take 75 mg by mouth daily.   fluticasone 50 MCG/ACT nasal spray Commonly known as:  FLONASE Place into both nostrils daily.   furosemide 40 MG tablet Commonly known as:  LASIX Take 40 mg by mouth 2 (two) times daily.   guaifenesin 100 MG/5ML syrup Commonly known as:  ROBITUSSIN Take 300 mg by mouth 3 (three) times daily as needed for cough.   ibuprofen 800 MG tablet Commonly known as:  ADVIL,MOTRIN Take 800 mg  by mouth every 8 (eight) hours as needed for mild pain or moderate pain.   insulin aspart 100 UNIT/ML injection Commonly known as:  novoLOG Inject 5-20 Units into the skin 3 (three) times daily with meals.   insulin glargine 100 UNIT/ML injection Commonly known as:  LANTUS Inject 25 Units into the skin at bedtime.   latanoprost 0.005 % ophthalmic solution Commonly known as:  XALATAN Place 1 drop into both eyes at bedtime.   methimazole 5 MG  tablet Commonly known as:  TAPAZOLE Take 5 mg by mouth daily.   multivitamin with minerals tablet Take 1 tablet by mouth daily.   nitroGLYCERIN 0.6 mg/hr patch Commonly known as:  NITRODUR - Dosed in mg/24 hr Place 0.6 mg onto the skin daily. Places at [place], removes at 10p   ondansetron 4 MG tablet Commonly known as:  ZOFRAN Take 1 tablet (4 mg total) by mouth every 6 (six) hours as needed for nausea.   oxyCODONE 5 MG immediate release tablet Commonly known as:  Oxy IR/ROXICODONE Take 1-2 tablets (5-10 mg total) by mouth every 4 (four) hours as needed for moderate pain (pain score 4-6).   pantoprazole 20 MG tablet Commonly known as:  PROTONIX Take 20 mg by mouth 2 (two) times daily.   ranitidine 150 MG tablet Commonly known as:  ZANTAC Take 150 mg by mouth at bedtime.   REFRESH TEARS 0.5 % Soln Generic drug:  carboxymethylcellulose Place 1 drop into both eyes 3 (three) times daily.   senna 8.6 MG Tabs tablet Commonly known as:  SENOKOT Take 1 tablet by mouth 2 (two) times daily.   sodium chloride 0.65 % Soln nasal spray Commonly known as:  OCEAN Place 1 spray into both nostrils as needed for congestion.   spironolactone 25 MG tablet Commonly known as:  ALDACTONE Take 25 mg by mouth 2 (two) times daily.   THERA-GESIC 0.5-15 % Crea Apply 1 application topically 2 (two) times daily as needed (pain).   timolol 0.5 % ophthalmic solution Commonly known as:  BETIMOL Place 1 drop into both eyes 3 (three) times daily.      Follow-up Information    Leim Fabry, MD Follow up in 2 week(s).   Specialty:  Orthopedic Surgery Contact information: [place] [address] [phone]           Signed: Reche Dixon 06/11/2017, 6:54 AM   Objective: Vital signs in last 24 hours: Temp:  [96.8 F (36 C)-98.9 F (37.2 C)] 98 F (36.7 C) (03/05 0452) Pulse Rate:  [52-100] 93 (03/05 0452) Resp:  [10-20] 18 (03/05 0452) BP: (125-171)/(68-104)  128/85 (03/05 0452) SpO2:  [94 %-100 %] 100 % (03/05 0452) Weight:  [95 kg (209 lb 7 oz)] 95 kg (209 lb 7 oz) (03/04 1649)  Intake/Output from previous day:  Intake/Output Summary (Last 24 hours) at 06/11/2017 0654 Last data filed at 06/11/2017 0639 Gross per 24 hour  Intake 3362.5 ml  Output 1900 ml  Net 1462.5 ml    Intake/Output this shift: Total I/O In: 1036.3 [I.V.:836.3; IV Piggyback:200] Out: 1230 [Urine:1000; Drains:230]  Labs: Recent Labs    06/11/17 0348  HGB 12.1   Recent Labs    06/11/17 0348  WBC 15.1*  RBC 4.06  HCT 37.9  PLT 191   Recent Labs    06/11/17 0348  NA 136  K 6.5*  CL 108  CO2 19*  BUN 19  CREATININE 2.06*  GLUCOSE 167*  CALCIUM 9.1  No results for input(s): LABPT, INR in the last 72 hours.  EXAM: General - Patient is Alert and Oriented Extremity - Neurovascular intact Sensation intact distally Compartment soft Incision - clean, dry, no drainage, with the Hemovac removed Motor Function -  deltoid function intact. Dorsiflexion and volar flexion of the finger and wrist intact.  Assessment/Plan: 1 Day Post-Op Procedure(s) (LRB): REVERSE SHOULDER ARTHROPLASTY (Left) BICEPS TENODESIS (Left) Procedure(s) (LRB): REVERSE SHOULDER ARTHROPLASTY (Left) BICEPS TENODESIS (Left) Past Medical History:  Diagnosis Date  . Bell palsy   . CHF (congestive heart failure) ([place])   . Complication of anesthesia    slow to wake up  . Diabetes mellitus without complication ([place])   . Dyspnea   . Environmental and seasonal allergies   . GERD (gastroesophageal reflux disease)   . Glaucoma   . Hypertension   . Hyperthyroidism   . Myocardial infarction ([place])   . Sleep apnea    Active Problems:   Status post shoulder replacement  Estimated body mass index is 34.85 kg/m as calculated from the following:   Height as of this encounter: 5\' 5"  (1.651 m).   Weight as of this encounter: 95 kg (209 lb 7 oz). Advance diet Up with therapy D/C IV  fluids  Plan to discharge home after physical therapy today. Diet - Regular diet Follow up - in 2 weeks Activity - WBAT in shoulder sling Disposition - Home Condition Upon Discharge - Stable DVT Prophylaxis - Aspirin  Reche Dixon, PA-C Orthopaedic Surgery 06/11/2017, 6:54 AM

## 2017-06-11 NOTE — Progress Notes (Signed)
Notified Dr. Leim Fabry that re-check of potassium was 5.0. MD acknowledged. Patient ok to discharge.

## 2017-06-11 NOTE — Discharge Instructions (Signed)
Katrina H. Patel, MD  [facility]  Phone: [phone]  Fax: [phone]   Discharge Instructions after Reverse Shoulder Replacement    1. Activity/Sling: You are to be non-weight bearing on operative extremity. A sling/shoulder immobilizer has been provided for you. Only remove the sling to perform elbow, wrist, and hand RoM exercises and hygiene/dressing. Active reaching and lifting are not permitted. You will be given further instructions on sling use at your first physical therapy visit and postoperative visit with Dr. Patel.   2. Dressings: Dressing may be removed at 1st physical therapy visit (~3-4 days after surgery). Afterwards, you may either leave open to air (if no drainage) or cover with dry, sterile dressing. If you have steri-strips on your wound, please do not remove them. They will fall off on their own. You may shower 5 days after surgery. Please pat incision dry. Do not rub or place any shear forces across incision. If there is drainage or any opening of incision after 5 days, please notify our offices immediately.    3. Driving:  Plan on not driving for six weeks. Please note that you are advised NOT to drive while taking narcotic pain medications as you may be impaired and unsafe to drive.   4. Medications:  - You have been provided a prescription for narcotic pain medicine (usually oxycodone). After surgery, take 1-2 narcotic tablets every 4 hours if needed for severe pain. Please start this as soon as you begin to start having pain (if you received a nerve block, start taking as soon as this wears off).  - A prescription for anti-nausea medication will be provided in case the narcotic medicine causes nausea - take 1 tablet every 6 hours only if nauseated.  - Take enteric coated aspirin 325 mg once daily for 6 weeks to prevent blood clots. Do not take aspirin if you have an aspirin sensitivity/allergy or asthma or are on an anticoagulant (blood thinner) already. If so, then  your home anticoagulant will be resume and managed - do not take aspirin. -Take tylenol 1000mg (2 Extra strength or 3 regular strength tablets) every 8 hours for pain. This will reduce the amount of narcotic medication needed. May stop tylenol when you are having minimal pain. - Take a stool softener (Colace, Dulcolax or Senakot) if you are using narcotic pain medications to help with constipation that is associated with narcotic use. - DO NOT take ANY nonsteroidal anti-inflammatory pain medications: Advil, Motrin, Ibuprofen, Aleve, Naproxen, or Naprosyn.   If you are taking prescription medication for anxiety, depression, insomnia, muscle spasm, chronic pain, or for attention deficit disorder you are advised that you are at a higher risk of adverse effects with use of narcotics post-op, including narcotic addiction/dependence, depressed breathing, death. If you use non-prescribed substances: alcohol, marijuana, cocaine, heroin, methamphetamines, etc., you are at a higher risk of adverse effects with use of narcotics post-op, including narcotic addiction/dependence, depressed breathing, death. You are advised that taking > 50 morphine milligram equivalents (MME) of narcotic pain medication per day results in twice the risk of overdose or death. For your prescription provided: oxycodone 5 mg - taking more than 6 tablets per day after the first few days of surgery.   5. Physical Therapy: 1-2 times per week for ~12 weeks. Therapy typically starts on post operative Day 3 or 4. You have been provided an order for physical therapy. The therapist will provide home exercises. Please contact our offices if this appointment has not been scheduled.      6. Work: May do light duty/desk job in approximately 2 weeks when off of narcotics, pain is well-controlled, and swelling has decreased if able to function with one arm in sling. Full work may take 6 weeks if light motions and function of both arms is required.  Lifting jobs may require 12 weeks.   7. Post-Op Appointments: Your first post-op appointment will be with Dr. Patel in approximately 2 weeks time.    If you find that they have not been scheduled please call the Orthopaedic Appointment front desk at [phone].                               Katrina H. Patel, MD [facility] Phone: [phone] Fax: [phone]   REVERSE SHOULDER ARTHROPLASTY REHAB GUIDELINES   These guidelines should be tailored to individual patients based on their rehab goals, age, precautions, quality of repair, etc.  Progression should be based on patient progress and approval by the referring physician.  PHASE 1 - Day 1 through Week 2  GENERAL GUIDELINES AND PRECAUTIONS Sling wear 24/7 except during grooming and home exercises (3 to 5 times daily) Avoid shoulder extension such that the arm is posterior the frontal plane.  When patients recline, a pillow should be placed behind the upper arm and sling should be on.  They should be advised to always be able to see the elbow Avoid combined IR/ADD/EXT, such as hand behind back to prevent dislocation Avoid combined IR and ADD such as reaching across the chest to prevent dislocation No AROM No submersion in pool/water for 4 weeks No weight bearing through operative arm (as in transfers, walker use, etc.)  GOALS Maintain integrity of joint replacement; protect soft tissue healing Increase PROM for elevation to 120 and ER to 30 (will remain the goal for first 6 weeks) Optimize distal UE circulation and muscle activity (elbow, wrist and hand) Instruct in use of sling for proper fit, polar care device for ice application after HEP, signs/symptoms of infection  EXERCISES Active elbow, wrist and hand Passive forward elevation in scapular plane to 90-120 max motion; ER in scapular plane to 30 Active scapular retraction with arms resting in neutral position  CRITERIA TO PROGRESS TO  PHASE 2 Low pain (less than 3/10) with shoulder PROM Healing of incision without signs of infection Clearance by MD to advance after 2 week MD check up  PHASE 2 - 2 weeks - 6 weeks  GENERAL GUIDELINES AND PRECAUTIONS Sling may be removed while at home; worn in community without abduction pillow May use arm for light activities of daily living (such as feeding, brushing teeth, dressing.) with elbow near  the side of the body  and arm in front of the body- no active lifting of the arm May submerge in water (tub, pool, Jacuzzi, etc.) after 4 weeks Continue to avoid WBing through the operative arm Continue to avoid combined IR/EXT/ADD (hand behind the back) and IR/ADD  (reaching across chest) for dislocation precautions  GOALS  Achieve passive elevation to 120 and ER to 30  Low (less than 3/10) to no pain  Ability to fire all heads of the deltoid  EXERCISES May discontinue grip, and active elbow and wrist exercises since using the arm in ADL's  with sling removed around the home Continue passive elevation to 120 and ER to 30, both in scapular plane with arm supported on table top Add submaximal isometrics, pain free effort, for all   functional heads of deltoid (anterior, posterior, middle)  Ensure that with posterior deltoid isometric the shoulder does not move into extension and the arm remains anterior the frontal plane At 4 weeks:  begin to place arm in balanced position of 90 deg elevation in supine; when patient able to hold this position with ease, may begin reverse pendulums clockwise and counterclockwise  CRITERIA TO PROGRESS TO PHASE 3 Passive forward elevation in scapular plane to 120; passive ER in scapular plane to 30 Ability to fire isometrically all heads of the deltoid muscle without pain Ability to place and hold the arm in balanced position (90 deg elevation in supine)  PHASE 3 - 6 weeks to 3 months  GENERAL GUIDELINES AND PRECAUTIONS Discontinue use of sling Avoid  forcing end range motion in any direction to prevent dislocation  May advance use of the arm actively in ADL's without being restricted to arm by the side of the body, however, avoid heavy lifting and sports (forever!) May initiate functional IR behind the back gently NO UPPER BODY ERGOMETER   GOALS Optimize PROM for elevation and ER in scapular plane with realistic expectation that max  mobility for elevation is usually around 145-160 passively; ER 40 to 50 passively; functional IR to L1 Recover AROM to approach as close to PROM available as possible; may expect 135-150 deg active elevation; 30 deg active ER; active functional IR to L1 Establish dynamic stability of the shoulder with deltoid and periscapular muscle gradual strengthening  EXERCISES Forward elevation in scapular plane active progression: supine to incline, to vertical; short to long lever arm Balanced position long lever arm AROM Active ER/IR with arm at side Scapular retraction with light band resistance Functional IR with hand slide up back - very gentle and gradual NO UPPER BODY ERGOMETER     CRITERIA TO PROGRESS TO PHASE 4  AROM equals/approaches PROM with good mechanics for elevation   No pain  Higher level demand on shoulder than ADL functions   PHASE 4 12 months and beyond  GENERAL GUIDELINES AND PRECAUTIONS No heavy lifting and no overhead sports No heavy pushing activity Gradually increase strength of deltoid and scapular stabilizers; also the rotator cuff if present with weights not to exceed 5 lbs NO UPPER BODY ERGOMETER   GOALS  Optimize functional use of the operative UE to meet the desired demands  Gradual increase in deltoid, scapular muscle, and rotator cuff strength  Pain free functional activities   EXERCISES Add light hand weights for deltoid up to and not to exceed 3 lbs for anterior and posterior with long arm lift against gravity; elbow bent to 90 deg for abduction in scapular  plane Theraband progression for extension to hip with scapular depression/retraction Theraband progression for serratus anterior punches in supine; avoid wall, incline or prone pressups for serratus anterior End range stretching gently without forceful overpressure in all planes (elevation in scapular plane, ER in scapular plane, functional IR) with stretching done for life as part of a daily routine NO UPPER BODY ERGOMETER     CRITERIA FOR DISCHARGE FROM SKILLED PHYSICAL THERAPY  Pain free AROM for shoulder elevation (expect around 135-150)  Functional strength for all ADL's, work tasks, and hobbies approved by surgeon  Independence with home maintenance program   NOTES: 1. With proper exercise, motion, strength, and function continue to improve even after one year. 2. The complication rate after surgery is 5 - 8%. Complications include infection, fracture, heterotopic bone formation, nerve injury, instability, rotator cuff   tear, and tuberosity nonunion. Please look for clinical signs, unusual symptoms, or lack of progress with therapy and report those to Dr. Patel. Prefer more communication than less.  3. The therapy plan above only serves as a guide. Please be aware of specific individualized patient instructions as written on the prescription or through discussions with the surgeon. 4. Please call Dr. Patel if you have any specific questions or concerns [phone]    

## 2017-06-11 NOTE — Care Management (Signed)
Patient has outpatient PT set up at [place] office Friday 3/8 at 10:00 am. She has a Therapist, sports that comes in and helps her and her significant other in the home.  No other discharge needs identified.

## 2017-06-11 NOTE — Progress Notes (Signed)
PT Cancellation Note  Patient Details Name: Katrina Hughes MRN: [id number] DOB: [birth year]   Cancelled Treatment:    Reason Eval/Treat Not Completed: Medical issues which prohibited therapy(K+ 6.5).  Will hold PT until pt more medically appropriate for physical activity.     Collie Siad PT, DPT 06/11/2017, 8:29 AM

## 2017-06-12 LAB — SURGICAL PATHOLOGY

## 2017-06-26 ENCOUNTER — Ambulatory Visit: Payer: Non-veteran care | Admitting: Podiatry

## 2017-06-27 ENCOUNTER — Ambulatory Visit: Payer: Non-veteran care | Admitting: Podiatry

## 2017-06-28 ENCOUNTER — Other Ambulatory Visit: Payer: Self-pay | Admitting: Podiatry

## 2017-06-28 ENCOUNTER — Ambulatory Visit (INDEPENDENT_AMBULATORY_CARE_PROVIDER_SITE_OTHER): Payer: Non-veteran care | Admitting: Podiatry

## 2017-06-28 DIAGNOSIS — D229 Melanocytic nevi, unspecified: Secondary | ICD-10-CM

## 2017-07-02 LAB — TISSUE SPECIMEN

## 2017-07-02 LAB — PATHOLOGY

## 2017-07-02 NOTE — Progress Notes (Signed)
  Subjective:  Patient ID: Katrina Hughes, female    DOB: Apr 14, 1950,  MRN: [id number]  No chief complaint on file.  67 y.o. female returns for planned punch biopsy R great toe.  Objective:  There were no vitals filed for this visit. General AA&O x3. Normal mood and affect.  Vascular Pedal pulses palpable.  Neurologic Epicritic sensation grossly intact.  Dermatologic Melanocytic lesion R plantar hallux.  Orthopedic: No pain to palpation either foot.   Assessment & Plan:  Patient was evaluated and treated and all questions answered.  Melanocytic Lesion R Great toe. -Biopsy as below. -F/u in 2 weeks to discuss results.  Procedure: Punch Biopsy Indication: Melanocytic lesion right hallux Anesthesia: Lidocaine 1% with epi, 1 cc Instrumentation: 2 mm punch biopsy Technique: punch biopsy. Dressing: Dry, sterile, compression dressing. Specimen: Labeled and sent for pathology. Disposition: Patient tolerated procedure well. Leave dressing on for 48 hours. Thereafter dress with antibiotic ointment and band-aid. Off-loading pads dispensed. Patient to return in 1 week for follow-up.  Return in about 2 weeks (around 07/12/2017) for Biopsy f/u.

## 2017-07-12 ENCOUNTER — Ambulatory Visit (INDEPENDENT_AMBULATORY_CARE_PROVIDER_SITE_OTHER): Payer: Non-veteran care | Admitting: Podiatry

## 2017-07-12 DIAGNOSIS — D229 Melanocytic nevi, unspecified: Secondary | ICD-10-CM

## 2017-08-05 NOTE — Progress Notes (Signed)
  Subjective:  Patient ID: Katrina Hughes, female    DOB: 1950-06-28,  MRN: [id number]  No chief complaint on file.  67 y.o. female returns for biopsy f/u. States the site is doing fine. No issues.  Objective:  There were no vitals filed for this visit. General AA&O x3. Normal mood and affect.  Vascular Pedal pulses palpable.  Neurologic Epicritic sensation grossly intact.  Dermatologic Right plantar hallux biopsy site healed.  Orthopedic: No pain to palpation either foot.   Assessment & Plan:  Patient was evaluated and treated and all questions answered.  Melanocytic Lesion R Great toe. -Biopsy results reviewed with patient.  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

## 2017-08-05 NOTE — Progress Notes (Signed)
Subjective:  Patient ID: Katrina Hughes, female    DOB: 07-Jul-1950,  MRN: [id number]  Chief Complaint  Patient presents with  . Skin Problem    B/L plantar dark spots and pain x 2 mo; 6/10 achy pain Tx; Thera-gesic cream    67 y.o. female presents with the above complaint.  Reports dark spot on the right great toe. Reports pain in the toe 6/10 and aching. Past Medical History:  Diagnosis Date  . Bell palsy   . CHF (congestive heart failure) ([place])   . Complication of anesthesia    slow to wake up  . Diabetes mellitus without complication ([place])   . Dyspnea   . Environmental and seasonal allergies   . GERD (gastroesophageal reflux disease)   . Glaucoma   . Hypertension   . Hyperthyroidism   . Myocardial infarction ([place])   . Sleep apnea    Past Surgical History:  Procedure Laterality Date  . APPENDECTOMY    . BICEPT TENODESIS Left 06/10/2017   Procedure: BICEPS TENODESIS;  Surgeon: Leim Fabry, MD;  Location: [facility] ORS;  Service: Orthopedics;  Laterality: Left;  . BREAST REDUCTION SURGERY    . CHOLECYSTECTOMY    . CORONARY ANGIOPLASTY WITH STENT PLACEMENT    . HERNIA REPAIR    . REVERSE SHOULDER ARTHROPLASTY Left 06/10/2017   Procedure: REVERSE SHOULDER ARTHROPLASTY;  Surgeon: Leim Fabry, MD;  Location: [facility] ORS;  Service: Orthopedics;  Laterality: Left;    Current Outpatient Medications:  .  acetaminophen (TYLENOL) 650 MG CR tablet, Take 1,300 mg by mouth 2 (two) times daily., Disp: , Rfl:  .  allopurinol (ZYLOPRIM) 100 MG tablet, Take 200 mg by mouth 2 (two) times daily., Disp: , Rfl:  .  ARTIFICIAL TEAR SOLUTION OP, Apply 1 drop to eye 4 (four) times daily as needed (dry eyes)., Disp: , Rfl:  .  atorvastatin (LIPITOR) 80 MG tablet, Take 40 mg by mouth at bedtime., Disp: , Rfl:  .  calcitRIOL (ROCALTROL) 0.25 MCG capsule, Take 0.25 mcg by mouth 2 (two) times daily., Disp: , Rfl:  .  carboxymethylcellulose (REFRESH TEARS) 0.5 % SOLN, Place 1 drop into both eyes 3 (three)  times daily., Disp: , Rfl:  .  carvedilol (COREG) 12.5 MG tablet, Take 6.25 mg by mouth 2 (two) times daily with a meal., Disp: , Rfl:  .  cetirizine (ZYRTEC) 10 MG tablet, Take 10 mg by mouth daily., Disp: , Rfl:  .  clopidogrel (PLAVIX) 75 MG tablet, Take 75 mg by mouth daily., Disp: , Rfl:  .  fluticasone (FLONASE) 50 MCG/ACT nasal spray, Place into both nostrils daily., Disp: , Rfl:  .  furosemide (LASIX) 40 MG tablet, Take 40 mg by mouth 2 (two) times daily., Disp: , Rfl:  .  guaifenesin (ROBITUSSIN) 100 MG/5ML syrup, Take 300 mg by mouth 3 (three) times daily as needed for cough., Disp: , Rfl:  .  ibuprofen (ADVIL,MOTRIN) 800 MG tablet, Take 800 mg by mouth every 8 (eight) hours as needed for mild pain or moderate pain., Disp: , Rfl:  .  insulin aspart (NOVOLOG) 100 UNIT/ML injection, Inject 5-20 Units into the skin 3 (three) times daily with meals., Disp: , Rfl:  .  insulin glargine (LANTUS) 100 UNIT/ML injection, Inject 25 Units into the skin at bedtime., Disp: , Rfl:  .  latanoprost (XALATAN) 0.005 % ophthalmic solution, Place 1 drop into both eyes at bedtime., Disp: , Rfl:  .  Menthol-Methyl Salicylate (THERA-GESIC) 0.5-15 % CREA, Apply 1 application  topically 2 (two) times daily as needed (pain)., Disp: , Rfl:  .  methimazole (TAPAZOLE) 5 MG tablet, Take 5 mg by mouth daily., Disp: , Rfl:  .  Multiple Vitamins-Minerals (MULTIVITAMIN WITH MINERALS) tablet, Take 1 tablet by mouth daily., Disp: , Rfl:  .  nitroGLYCERIN (NITRODUR - DOSED IN MG/24 HR) 0.6 mg/hr patch, Place 0.6 mg onto the skin daily. Places at [place], removes at 10p, Disp: , Rfl:  .  pantoprazole (PROTONIX) 20 MG tablet, Take 20 mg by mouth 2 (two) times daily., Disp: , Rfl:  .  ranitidine (ZANTAC) 150 MG tablet, Take 150 mg by mouth at bedtime., Disp: , Rfl:  .  senna (SENOKOT) 8.6 MG TABS tablet, Take 1 tablet by mouth 2 (two) times daily., Disp: , Rfl:  .  sodium chloride (OCEAN) 0.65 % SOLN nasal spray, Place 1 spray into  both nostrils as needed for congestion., Disp: , Rfl:  .  spironolactone (ALDACTONE) 25 MG tablet, Take 25 mg by mouth 2 (two) times daily., Disp: , Rfl:  .  timolol (BETIMOL) 0.5 % ophthalmic solution, Place 1 drop into both eyes 3 (three) times daily., Disp: , Rfl:  .  aspirin EC 325 MG EC tablet, Take 1 tablet (325 mg total) by mouth daily., Disp: 42 tablet, Rfl: 0 .  ondansetron (ZOFRAN) 4 MG tablet, Take 1 tablet (4 mg total) by mouth every 6 (six) hours as needed for nausea., Disp: 20 tablet, Rfl: 0 .  oxyCODONE (OXY IR/ROXICODONE) 5 MG immediate release tablet, Take 1-2 tablets (5-10 mg total) by mouth every 4 (four) hours as needed for moderate pain (pain score 4-6)., Disp: 40 tablet, Rfl: 0  Allergies  Allergen Reactions  . Sulfur Other (See Comments)    Sulfur based products  . Aspirin Other (See Comments)    Ulcers in stomach   Review of Systems: Negative except as noted in the HPI. Denies N/V/F/Ch. Objective:  There were no vitals filed for this visit. General AA&O x3. Normal mood and affect.  Vascular Dorsalis pedis and posterior tibial pulses  present 2+ bilaterally  Capillary refill normal to all digits. Pedal hair growth normal.  Neurologic Epicritic sensation grossly present.  Dermatologic No open lesions. Interspaces clear of maceration. Nails well groomed and normal in appearance. Nevus right great toe irregular border  Orthopedic: MMT 5/5 in dorsiflexion, plantarflexion, inversion, and eversion. Normal joint ROM without pain or crepitus.   Assessment & Plan:  Patient was evaluated and treated and all questions answered.  Nevus R Great Toe -Educated on etiology -Patient would benefit from biopsy of the-nevus.  We will perform in the upcoming weeks.  No follow-ups on file.

## 2017-08-23 ENCOUNTER — Ambulatory Visit: Payer: Non-veteran care | Admitting: Podiatry

## 2017-09-05 ENCOUNTER — Ambulatory Visit: Payer: Non-veteran care | Admitting: Podiatry

## 2017-09-17 ENCOUNTER — Emergency Department (HOSPITAL_COMMUNITY): Payer: Non-veteran care

## 2017-09-17 ENCOUNTER — Encounter (HOSPITAL_COMMUNITY): Payer: Self-pay | Admitting: Emergency Medicine

## 2017-09-17 ENCOUNTER — Encounter (HOSPITAL_COMMUNITY)
Admission: EM | Disposition: A | Discharge status: Home / Self Care | Payer: Self-pay | Source: Home / Self Care | Attending: Emergency Medicine

## 2017-09-17 ENCOUNTER — Emergency Department (HOSPITAL_COMMUNITY)
Admission: EM | Admit: 2017-09-17 | Discharge: 2017-09-17 | Disposition: A | Discharge status: Home / Self Care | Payer: Non-veteran care | Attending: Emergency Medicine | Admitting: Emergency Medicine

## 2017-09-17 ENCOUNTER — Other Ambulatory Visit: Payer: Self-pay

## 2017-09-17 DIAGNOSIS — E1122 Type 2 diabetes mellitus with diabetic chronic kidney disease: Secondary | ICD-10-CM | POA: Insufficient documentation

## 2017-09-17 DIAGNOSIS — I509 Heart failure, unspecified: Secondary | ICD-10-CM | POA: Diagnosis not present

## 2017-09-17 DIAGNOSIS — N189 Chronic kidney disease, unspecified: Secondary | ICD-10-CM | POA: Diagnosis not present

## 2017-09-17 DIAGNOSIS — I130 Hypertensive heart and chronic kidney disease with heart failure and stage 1 through stage 4 chronic kidney disease, or unspecified chronic kidney disease: Secondary | ICD-10-CM | POA: Insufficient documentation

## 2017-09-17 DIAGNOSIS — Z7982 Long term (current) use of aspirin: Secondary | ICD-10-CM | POA: Insufficient documentation

## 2017-09-17 DIAGNOSIS — R42 Dizziness and giddiness: Secondary | ICD-10-CM | POA: Insufficient documentation

## 2017-09-17 DIAGNOSIS — Z794 Long term (current) use of insulin: Secondary | ICD-10-CM | POA: Diagnosis not present

## 2017-09-17 DIAGNOSIS — R040 Epistaxis: Secondary | ICD-10-CM | POA: Insufficient documentation

## 2017-09-17 DIAGNOSIS — Z7902 Long term (current) use of antithrombotics/antiplatelets: Secondary | ICD-10-CM | POA: Insufficient documentation

## 2017-09-17 DIAGNOSIS — Z87891 Personal history of nicotine dependence: Secondary | ICD-10-CM | POA: Diagnosis not present

## 2017-09-17 HISTORY — PX: NASAL HEMORRHAGE CONTROL: SHX287

## 2017-09-17 LAB — CBG MONITORING, ED: GLUCOSE-CAPILLARY: 221 mg/dL — AB (ref 65–99)

## 2017-09-17 LAB — I-STAT CHEM 8, ED
BUN: 28 mg/dL — ABNORMAL HIGH (ref 6–20)
CALCIUM ION: 1.34 mmol/L (ref 1.15–1.40)
CHLORIDE: 113 mmol/L — AB (ref 101–111)
CREATININE: 2.5 mg/dL — AB (ref 0.44–1.00)
GLUCOSE: 215 mg/dL — AB (ref 65–99)
HCT: 37 % (ref 36.0–46.0)
Hemoglobin: 12.6 g/dL (ref 12.0–15.0)
Potassium: 4.3 mmol/L (ref 3.5–5.1)
Sodium: 145 mmol/L (ref 135–145)
TCO2: 21 mmol/L — ABNORMAL LOW (ref 22–32)

## 2017-09-17 LAB — CBC
HCT: 38.4 % (ref 36.0–46.0)
Hemoglobin: 11.6 g/dL — ABNORMAL LOW (ref 12.0–15.0)
MCH: 28 pg (ref 26.0–34.0)
MCHC: 30.2 g/dL (ref 30.0–36.0)
MCV: 92.5 fL (ref 78.0–100.0)
Platelets: 207 10*3/uL (ref 150–400)
RBC: 4.15 MIL/uL (ref 3.87–5.11)
RDW: 15.2 % (ref 11.5–15.5)
WBC: 7.3 10*3/uL (ref 4.0–10.5)

## 2017-09-17 LAB — PROTIME-INR
INR: 0.99
Prothrombin Time: 13 seconds (ref 11.4–15.2)

## 2017-09-17 SURGERY — CONTROL OF EPISTAXIS
Anesthesia: Choice | Site: Nose

## 2017-09-17 MED ORDER — COCAINE HCL 4 % EX SOLN
4.0000 mL | Freq: Once | CUTANEOUS | Status: AC
  Administered 2017-09-17: 4 mL via NASAL
  Filled 2017-09-17: qty 4

## 2017-09-17 MED ORDER — OXYMETAZOLINE HCL 0.05 % NA SOLN
1.0000 | Freq: Once | NASAL | Status: DC
  Filled 2017-09-17: qty 15

## 2017-09-17 SURGICAL SUPPLY — 3 items
COAGULATOR SUCT 8FR VV (MISCELLANEOUS) ×2 IMPLANT
ELECT REM PT RETURN 9FT ADLT (ELECTROSURGICAL) ×2
ELECTRODE REM PT RTRN 9FT ADLT (ELECTROSURGICAL) ×1 IMPLANT

## 2017-09-17 NOTE — ED Notes (Signed)
Both sides of her nose is now packed.

## 2017-09-17 NOTE — ED Notes (Signed)
Patient resting bleeding appears to have stopped at present.

## 2017-09-17 NOTE — Discharge Instructions (Addendum)
Stop by my office please, [address] for nasal hygiene and epistaxis instructions.  [phone].  Recheck appointment 2 weeks please.

## 2017-09-17 NOTE — ED Notes (Signed)
Blood continues to ooze around packing

## 2017-09-17 NOTE — ED Triage Notes (Signed)
Pt presents with significant nose bleed, pt reports bleeding intermittent all week, tonight bleeding began several hours ago and she has not been able to get bleeding under control. Pt does not take blood thinners.

## 2017-09-17 NOTE — ED Notes (Signed)
Patient is scheduled for eye surgery on Thurs at the [place] in [facility] [address]

## 2017-09-17 NOTE — ED Provider Notes (Signed)
[facility] Provider Note   CSN: [id number] Arrival date & time: 09/17/17  0316     History   Chief Complaint Chief Complaint  Patient presents with  . Epistaxis    HPI Katrina Hughes is a 67 y.o. female.  The history is provided by the patient and a relative.  Epistaxis   This is a new problem. The current episode started more than 2 days ago. The problem has been rapidly worsening. The problem is associated with aspirin (Plavix). The bleeding has been from both nares. She has tried applying pressure for the symptoms. The treatment provided no relief.  patient with h/o CHF, diabetes, hypertension presents with nosebleed She reports she has been having intermittent bleeding for the past several days, but tonight became acutely worse. She reports mild dizziness.  No headache, no chest pain/shortness of breath No trauma reported.  She is never really had this before Past Medical History:  Diagnosis Date  . Bell palsy   . CHF (congestive heart failure) ([place])   . Complication of anesthesia    slow to wake up  . Diabetes mellitus without complication ([place])   . Dyspnea   . Environmental and seasonal allergies   . GERD (gastroesophageal reflux disease)   . Glaucoma   . Hypertension   . Hyperthyroidism   . Myocardial infarction ([place])   . Sleep apnea     Patient Active Problem List   Diagnosis Date Noted  . 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 APPENDECTOMY    . BICEPT TENODESIS Left 06/10/2017   Procedure: BICEPS TENODESIS;  Surgeon: Leim Fabry, MD;  Location: [facility] ORS;  Service: Orthopedics;  Laterality: Left;  . BREAST REDUCTION SURGERY    . CHOLECYSTECTOMY    . CORONARY ANGIOPLASTY WITH STENT PLACEMENT    . HERNIA REPAIR    . REVERSE SHOULDER ARTHROPLASTY Left 06/10/2017   Procedure: REVERSE SHOULDER ARTHROPLASTY;  Surgeon: Leim Fabry, MD;  Location: [facility] ORS;   Service: Orthopedics;  Laterality: Left;     OB History   None      Home Medications    Prior to Admission medications   Medication Sig Start Date End Date Taking? Authorizing Provider  acetaminophen (TYLENOL) 650 MG CR tablet Take 1,300 mg by mouth 2 (two) times daily.    [provider]  allopurinol (ZYLOPRIM) 100 MG tablet Take 200 mg by mouth 2 (two) times daily.    [provider]  ARTIFICIAL TEAR SOLUTION OP Apply 1 drop to eye 4 (four) times daily as needed (dry eyes).    [provider]  aspirin EC 325 MG EC tablet Take 1 tablet (325 mg total) by mouth daily. 06/11/17   Reche Dixon, PA-C  atorvastatin (LIPITOR) 80 MG tablet Take 40 mg by mouth at bedtime.    [provider]  calcitRIOL (ROCALTROL) 0.25 MCG capsule Take 0.25 mcg by mouth 2 (two) times daily.    [provider]  carboxymethylcellulose (REFRESH TEARS) 0.5 % SOLN Place 1 drop into both eyes 3 (three) times daily.    [provider]  carvedilol (COREG) 12.5 MG tablet Take 6.25 mg by mouth 2 (two) times daily with a meal.    [provider]  cetirizine (ZYRTEC) 10 MG tablet Take 10 mg by mouth daily.    [provider]  clopidogrel (PLAVIX) 75 MG tablet Take 75 mg by mouth daily.  [provider]  fluticasone (FLONASE) 50 MCG/ACT nasal spray Place into both nostrils daily.    [provider]  furosemide (LASIX) 40 MG tablet Take 40 mg by mouth 2 (two) times daily.    [provider]  guaifenesin (ROBITUSSIN) 100 MG/5ML syrup Take 300 mg by mouth 3 (three) times daily as needed for cough.    [provider]  ibuprofen (ADVIL,MOTRIN) 800 MG tablet Take 800 mg by mouth every 8 (eight) hours as needed for mild pain or moderate pain.    [provider]  insulin aspart (NOVOLOG) 100 UNIT/ML injection Inject 5-20 Units into the skin 3 (three) times daily with meals.    [provider]  insulin glargine  (LANTUS) 100 UNIT/ML injection Inject 25 Units into the skin at bedtime.    [provider]  latanoprost (XALATAN) 0.005 % ophthalmic solution Place 1 drop into both eyes at bedtime.    [provider]  Menthol-Methyl Salicylate (THERA-GESIC) 0.5-15 % CREA Apply 1 application topically 2 (two) times daily as needed (pain).    [provider]  methimazole (TAPAZOLE) 5 MG tablet Take 5 mg by mouth daily.    [provider]  Multiple Vitamins-Minerals (MULTIVITAMIN WITH MINERALS) tablet Take 1 tablet by mouth daily.    [provider]  nitroGLYCERIN (NITRODUR - DOSED IN MG/24 HR) 0.6 mg/hr patch Place 0.6 mg onto the skin daily. Places at [place], removes at 10p    [provider]  ondansetron (ZOFRAN) 4 MG tablet Take 1 tablet (4 mg total) by mouth every 6 (six) hours as needed for nausea. 06/11/17   Reche Dixon, PA-C  oxyCODONE (OXY IR/ROXICODONE) 5 MG immediate release tablet Take 1-2 tablets (5-10 mg total) by mouth every 4 (four) hours as needed for moderate pain (pain score 4-6). 06/11/17   Reche Dixon, PA-C  pantoprazole (PROTONIX) 20 MG tablet Take 20 mg by mouth 2 (two) times daily.    [provider]  ranitidine (ZANTAC) 150 MG tablet Take 150 mg by mouth at bedtime.    [provider]  senna (SENOKOT) 8.6 MG TABS tablet Take 1 tablet by mouth 2 (two) times daily.    [provider]  sodium chloride (OCEAN) 0.65 % SOLN nasal spray Place 1 spray into both nostrils as needed for congestion.    [provider]  spironolactone (ALDACTONE) 25 MG tablet Take 25 mg by mouth 2 (two) times daily.    [provider]  timolol (BETIMOL) 0.5 % ophthalmic solution Place 1 drop into both eyes 3 (three) times daily.    [provider]    Family History No family history on file.  Social History Social History   Tobacco Use  . Smoking status: Former Smoker    Last attempt to quit: 1980    Years since  quitting: 39.4  . Smokeless tobacco: Never Used  Substance Use Topics  . Alcohol use: No    Frequency: Never  . Drug use: Yes    Types: Marijuana     Allergies   Sulfur and Aspirin   Review of Systems Review of Systems  Constitutional: Negative for fever.  HENT: Positive for nosebleeds.   Respiratory: Negative for shortness of breath.   Cardiovascular: Negative for chest pain.  Gastrointestinal: Negative for vomiting.  Neurological: Positive for dizziness.  All other systems reviewed and are negative.    Physical Exam Updated Vital Signs BP 138/90   Pulse 91   Temp 98 F (  36.7 C) (Temporal)   Resp 17   Ht 1.651 m (5\' 5" )   Wt 92.5 kg (204 lb)   SpO2 97%   BMI 33.95 kg/m   Physical Exam CONSTITUTIONAL: Well developed/well nourished HEAD: Normocephalic/atraumatic EYES: EOMI/PERRL ENMT: Mucous membranes moist, active bleeding from right nare, bleeding noted from left nare NECK: supple no meningeal signs SPINE/BACK:entire spine nontender CV: S1/S2 noted, no murmurs/rubs/gallops noted LUNGS: Lungs are clear to auscultation bilaterally, no apparent distress ABDOMEN: soft, GU:no cva tenderness NEURO: Pt is awake/alert/appropriate, moves all extremitiesx4.  No facial droop.   EXTREMITIES:  full ROM SKIN: warm, color normal PSYCH: no abnormalities of mood noted, alert and oriented to situation   ED Treatments / Results  Labs (all labs ordered are listed, but only abnormal results are displayed) Labs Reviewed  CBC - Abnormal; Notable for the following components:      Result Value   Hemoglobin 11.6 (*)    All other components within normal limits  CBG MONITORING, ED - Abnormal; Notable for the following components:   Glucose-Capillary 221 (*)    All other components within normal limits  I-STAT CHEM 8, ED - Abnormal; Notable for the following components:   Chloride 113 (*)    BUN 28 (*)    Creatinine, Ser 2.50 (*)    Glucose, Bld 215 (*)    TCO2 21 (*)      All other components within normal limits  PROTIME-INR    EKG EKG Interpretation  Date/Time:  Tuesday September 17 2017 05:57:57 EDT Ventricular Rate:  81 PR Interval:    QRS Duration: 98 QT Interval:  383 QTC Calculation: 445 R Axis:   14 Text Interpretation:  Sinus rhythm Probable left ventricular hypertrophy No significant change since last tracing Confirmed by Ripley Fraise [phone]) on 09/17/2017 6:04:20 AM Also confirmed by Ripley Fraise [phone]), editor Hattie Perch [phone])  on 09/17/2017 6:38:36 AM   Radiology Dg Chest Port 1 View  Result Date: 09/17/2017 CLINICAL DATA:  Weakness.  Nose bleed. EXAM: PORTABLE CHEST 1 VIEW COMPARISON:  None. FINDINGS: Very low lung volumes limits assessment. Heart appears prominent size. Mild bibasilar atelectasis. No pulmonary edema, large pleural effusion or pneumothorax. Reverse left shoulder arthroplasty. Gaseous distention of stomach suspected in the upper abdomen. No acute osseous abnormalities are seen. IMPRESSION: Very low lung volumes limit assessment.  Mild bibasilar atelectasis. Electronically Signed   By: Jeb Levering M.D.   On: 09/17/2017 06:17    Procedures .Epistaxis Management Date/Time: 09/17/2017 7:05 AM Performed by: Ripley Fraise, MD Authorized by: Ripley Fraise, MD   Consent:    Consent obtained:  Verbal   Consent given by:  Patient Anesthesia (see MAR for exact dosages):    Anesthesia method:  Topical application Procedure details:    Treatment site:  L anterior and R anterior   Treatment method:  Anterior pack   Treatment complexity:  Extensive   Treatment episode: initial   Post-procedure details:    Assessment:  Bleeding decreased   Patient tolerance of procedure:  Tolerated with difficulty    Medications Ordered in ED Medications  oxymetazoline (AFRIN) 0.05 % nasal spray 1 spray (has no administration in time range)     Initial Impression / Assessment and Plan / ED Course  I have  reviewed the triage vital signs and the nursing notes.  Pertinent labs & imaging results that were available during my care of the patient were reviewed by me and considered in my medical decision making (see chart  for details).     7:05 AM Patient seen on arrival to the room she had active bleeding from both nares, right greater than left.  I really attempted to place a posterior [employer], she did tolerate this initially but then after coughing it came out.  She then began bleeding from both nares again.  I then elected to pack bilateral nares She walked to the bathroom, came back to the room when she sat down she became diaphoretic and slumped over in the bed.  No trauma reported.  Patient reported feeling very weak.  EKG unremarkable.  Chest x-ray without significant findings.  Chronic renal insufficiency noted   Her skin was cool to touch.  Suspect she had a vagal response with near syncopal episode, and she is now improving.  However she does have persistent bleeding from the nares as well as some into her oropharynx  I have consulted ear nose and throat Dr. Erik Obey to evaluate patient  7:07 AM D/w dr Melina Copa at signout to f/u on ENT recs.    Final Clinical Impressions(s) / ED Diagnoses   Final diagnoses:  Epistaxis  Chronic renal impairment, unspecified CKD stage    ED Discharge Orders    None       Ripley Fraise, MD 09/17/17 [phone]

## 2017-09-17 NOTE — ED Notes (Addendum)
Family called staff to bedside after family had helped pt walk to and from bathroom. Pt found slumped on the side rail, clammy and diaphoretic. Pt vomiting large bright red clots. Staff assisted pt to lying position. Dr.Wickline at bedside, IV placed, repeat chem 8 drawn per orders

## 2017-09-17 NOTE — ED Notes (Signed)
Dr. Christy Gentles at bedside  Repacked right side of her nose , coughed up large clot , left side started bleeding again.

## 2017-09-17 NOTE — ED Provider Notes (Signed)
Note to me from Dr. Christy Gentles.  She has had intractable nosebleed and is pending ENT.  Patient was seen by Dr. Erik Obey by ENT who cauterized the patient and has established some hemostasis.  Patient is comfortable being discharged she states she would like to leave here and go over to the [facility] where they can reevaluate her because of her insurance status.   Katrina Rasmussen, MD 09/17/17 [phone]

## 2017-09-17 NOTE — ED Triage Notes (Signed)
Nose bleed onset 2 am , family states she has been having nosebleed x 3 weeks however they are usually able to stop it after 20 mins. Unable to do so today. Actively bleeding upon  Arrival , bleeding from both sides worse on the right.

## 2017-09-17 NOTE — Consult Note (Signed)
Katrina Hughes, Katrina Hughes 67 y.o., female [id number]     Chief Complaint: RIGHT epistaxis  HPI: 67 yo bf, on Plavix for 3 coronary stents.    Onset RIGHT epistaxis 3 weeks ago.  More frequent last 2-3 days.  In ED, unable to stop with bilateral packing.  Known CRF, HTN.  No ASA x 1 yr.   PMH: Past Medical History:  Diagnosis Date  . Katrina Hughes   . CHF (congestive heart failure) ([place])   . Complication of anesthesia    slow to wake up  . Diabetes mellitus without complication (Katrina Hughes)   . Dyspnea   . Environmental and seasonal allergies   . GERD (gastroesophageal reflux disease)   . Glaucoma   . Hypertension   . Hyperthyroidism   . Myocardial infarction (Katrina Hughes)   . Sleep apnea     Surg Hx: Past Surgical History:  Procedure Laterality Date  . APPENDECTOMY    . BICEPT TENODESIS Left 06/10/2017   Procedure: BICEPS TENODESIS;  Surgeon: Leim Fabry, MD;  Location: [facility] ORS;  Service: Orthopedics;  Laterality: Left;  . BREAST REDUCTION SURGERY    . CHOLECYSTECTOMY    . CORONARY ANGIOPLASTY WITH STENT PLACEMENT    . HERNIA REPAIR    . REVERSE SHOULDER ARTHROPLASTY Left 06/10/2017   Procedure: REVERSE SHOULDER ARTHROPLASTY;  Surgeon: Leim Fabry, MD;  Location: [facility] ORS;  Service: Orthopedics;  Laterality: Left;    FHx:  No family history on file. SocHx:  reports that she quit smoking about 39 years ago. She has never used smokeless tobacco. She reports that she has current or past drug history. Drug: Marijuana. She reports that she does not drink alcohol.  ALLERGIES:  Allergies  Allergen Reactions  . Sulfur Other (See Comments)    Sulfur based products  . Aspirin Other (See Comments)    Ulcers in stomach     (Not in a hospital admission)  Results for orders placed or performed during the hospital encounter of 09/17/17 (from the past 48 hour(s))  CBC     Status: Abnormal   Collection Time: 09/17/17  4:41 AM  Result Value Ref Range   WBC 7.3 4.0 - 10.5 K/uL   RBC 4.15 3.87 - 5.11  MIL/uL   Hemoglobin 11.6 (L) 12.0 - 15.0 g/dL   HCT 38.4 36.0 - 46.0 %   MCV 92.5 78.0 - 100.0 fL   MCH 28.0 26.0 - 34.0 pg   MCHC 30.2 30.0 - 36.0 g/dL   RDW 15.2 11.5 - 15.5 %   Platelets 207 150 - 400 K/uL    Comment: Performed at Katrina Hughes, [facility] [address] [place], [place] [address]  Protime-INR     Status: None   Collection Time: 09/17/17  4:41 AM  Result Value Ref Range   Prothrombin Time 13.0 11.4 - 15.2 seconds   INR 0.99     Comment: Performed at Kat[facility] [address], [place], [place] [address]  CBG monitoring, ED     Status: Abnormal   Collection Time: 09/17/17  5:53 AM  Result Value Ref Range   Glucose-Capillary 221 (H) 65 - 99 mg/dL  I-stat chem 8, ed     Status: Abnormal   Collection Time: 09/17/17  6:11 AM  Result Value Ref Range   Sodium 145 135 - 145 mmol/L   Potassium 4.3 3.5 - 5.1 mmol/L   Chloride 113 (H) 101 - 111 mmol/L   BUN 28 (H) 6 - 20 mg/dL  Creatinine, Ser 2.50 (H) 0.44 - 1.00 mg/dL   Glucose, Bld 215 (H) 65 - 99 mg/dL   Calcium, Ion 1.34 1.15 - 1.40 mmol/L   TCO2 21 (L) 22 - 32 mmol/L   Hemoglobin 12.6 12.0 - 15.0 g/dL   HCT 37.0 36.0 - 46.0 %   Dg Chest Port 1 View  Result Date: 09/17/2017 CLINICAL DATA:  Weakness.  Nose bleed. EXAM: PORTABLE CHEST 1 VIEW COMPARISON:  None. FINDINGS: Very low lung volumes limits assessment. Heart appears prominent size. Mild bibasilar atelectasis. No pulmonary edema, large pleural effusion or pneumothorax. Reverse left shoulder arthroplasty. Gaseous distention of stomach suspected in the upper abdomen. No acute osseous abnormalities are seen. IMPRESSION: Very low lung volumes limit assessment.  Mild bibasilar atelectasis. Electronically Signed   By: Jeb Levering M.D.   On: 09/17/2017 06:17      Blood pressure 138/90, pulse 91, temperature 98 F (36.7 C), temperature source Temporal, resp. rate 17, height 5\' 5"  (1.651 m), weight 92.5 kg (204 lb), SpO2 97 %.  PHYSICAL EXAM: Overall  appearance:  Older than stated age.  Coughing and vomiting up blood.   Head: NCAT Ears:  clear Nose:  Balloon packs bilat.  Upon removal of packs and clots, bleeding from RIGHT posterior nose. Oral Cavity:  Few teeth in fair repair.   Oral Pharynx/Hypopharynx/Larynx:  Old blood and slow active oozing. Neuro: grossly intact Neck:  clear      Assessment/Plan RIGHT posterior epistaxis.  Plavix, HTN contributing.   Plan:  With informed consent, anesthetized the RIGHT nose with 4% cocaine solution.  Zero and 30 degree endoscopes used.  Bleeding identified posterior superior to middle turbinate  Was able to further anesthetize with 1% xylocaine with 1:100,000 epinephrine.  Middle turbinate slightly outfractured.  Suction cautery applied to supero-medial surface of middle turbinate  With good tolerance.  Hemostasis achieved.    She will stop by my office for epistaxis instructions.  Recheck my office 2 weeks.  Continue Plavix as needed.    [name] 07/21/2393, 9:12 AM

## 2017-09-19 ENCOUNTER — Encounter (HOSPITAL_COMMUNITY): Payer: Self-pay | Admitting: Otolaryngology

## 2017-09-26 ENCOUNTER — Ambulatory Visit: Payer: Non-veteran care | Admitting: Podiatry

## 2017-10-04 ENCOUNTER — Ambulatory Visit: Payer: Non-veteran care | Admitting: Podiatry

## 2017-11-14 ENCOUNTER — Encounter (HOSPITAL_COMMUNITY): Payer: Self-pay | Admitting: Otolaryngology

## 2018-06-18 ENCOUNTER — Other Ambulatory Visit: Payer: Self-pay | Admitting: Orthopedic Surgery

## 2018-06-18 DIAGNOSIS — Z96612 Presence of left artificial shoulder joint: Secondary | ICD-10-CM

## 2018-06-18 DIAGNOSIS — M25511 Pain in right shoulder: Secondary | ICD-10-CM

## 2018-07-01 ENCOUNTER — Other Ambulatory Visit: Payer: Non-veteran care

## 2018-07-17 ENCOUNTER — Ambulatory Visit: Payer: Non-veteran care | Attending: Orthopedic Surgery

## 2018-08-25 ENCOUNTER — Ambulatory Visit
Admission: RE | Admit: 2018-08-25 | Discharge: 2018-08-25 | Disposition: A | Discharge status: Home / Self Care | Payer: No Typology Code available for payment source | Source: Ambulatory Visit | Attending: Orthopedic Surgery | Admitting: Orthopedic Surgery

## 2018-08-25 ENCOUNTER — Other Ambulatory Visit: Payer: Self-pay

## 2018-08-25 DIAGNOSIS — Z96612 Presence of left artificial shoulder joint: Secondary | ICD-10-CM | POA: Diagnosis present

## 2018-08-25 DIAGNOSIS — M25511 Pain in right shoulder: Secondary | ICD-10-CM | POA: Diagnosis not present

## 2018-10-13 ENCOUNTER — Encounter
Admission: RE | Admit: 2018-10-13 | Discharge: 2018-10-13 | Disposition: A | Discharge status: Home / Self Care | Payer: No Typology Code available for payment source | Source: Ambulatory Visit | Attending: Orthopedic Surgery | Admitting: Orthopedic Surgery

## 2018-10-13 ENCOUNTER — Other Ambulatory Visit: Payer: Self-pay

## 2018-10-13 DIAGNOSIS — E119 Type 2 diabetes mellitus without complications: Secondary | ICD-10-CM | POA: Insufficient documentation

## 2018-10-13 DIAGNOSIS — Z01818 Encounter for other preprocedural examination: Secondary | ICD-10-CM | POA: Insufficient documentation

## 2018-10-13 DIAGNOSIS — Z01810 Encounter for preprocedural cardiovascular examination: Secondary | ICD-10-CM | POA: Diagnosis not present

## 2018-10-13 DIAGNOSIS — I10 Essential (primary) hypertension: Secondary | ICD-10-CM | POA: Diagnosis not present

## 2018-10-13 HISTORY — DX: Angina pectoris, unspecified: I20.9

## 2018-10-13 HISTORY — DX: Chronic kidney disease, unspecified: N18.9

## 2018-10-13 LAB — CBC
HCT: 42.1 % (ref 36.0–46.0)
Hemoglobin: 12.9 g/dL (ref 12.0–15.0)
MCH: 29.3 pg (ref 26.0–34.0)
MCHC: 30.6 g/dL (ref 30.0–36.0)
MCV: 95.5 fL (ref 80.0–100.0)
Platelets: 208 10*3/uL (ref 150–400)
RBC: 4.41 MIL/uL (ref 3.87–5.11)
RDW: 14.4 % (ref 11.5–15.5)
WBC: 7 10*3/uL (ref 4.0–10.5)
nRBC: 0 % (ref 0.0–0.2)

## 2018-10-13 LAB — BASIC METABOLIC PANEL
Anion gap: 7 (ref 5–15)
BUN: 40 mg/dL — ABNORMAL HIGH (ref 8–23)
CO2: 24 mmol/L (ref 22–32)
Calcium: 9.9 mg/dL (ref 8.9–10.3)
Chloride: 113 mmol/L — ABNORMAL HIGH (ref 98–111)
Creatinine, Ser: 2.66 mg/dL — ABNORMAL HIGH (ref 0.44–1.00)
GFR calc Af Amer: 21 mL/min — ABNORMAL LOW (ref >60)
GFR calc non Af Amer: 18 mL/min — ABNORMAL LOW (ref >60)
Glucose, Bld: 93 mg/dL (ref 70–99)
Potassium: 4.1 mmol/L (ref 3.5–5.1)
Sodium: 144 mmol/L (ref 135–145)

## 2018-10-13 LAB — URINALYSIS, ROUTINE W REFLEX MICROSCOPIC
Bacteria, UA: NONE SEEN
Bilirubin Urine: NEGATIVE
Glucose, UA: NEGATIVE mg/dL
Hgb urine dipstick: NEGATIVE
Ketones, ur: NEGATIVE mg/dL
Nitrite: NEGATIVE
Protein, ur: 100 mg/dL — AB
Specific Gravity, Urine: 1.013 (ref 1.005–1.030)
pH: 5 (ref 5.0–8.0)

## 2018-10-13 LAB — TYPE AND SCREEN
ABO/RH(D): O POS
Antibody Screen: NEGATIVE

## 2018-10-13 LAB — SURGICAL PCR SCREEN
MRSA, PCR: NEGATIVE
Staphylococcus aureus: NEGATIVE

## 2018-10-13 LAB — SEDIMENTATION RATE: Sed Rate: 38 mm/hr — ABNORMAL HIGH (ref 0–30)

## 2018-10-13 LAB — PROTIME-INR
INR: 1.1 (ref 0.8–1.2)
Prothrombin Time: 14.1 seconds (ref 11.4–15.2)

## 2018-10-13 LAB — APTT: aPTT: 30 seconds (ref 24–36)

## 2018-10-13 NOTE — Patient Instructions (Signed)
Your procedure is scheduled on: Monday 10/20/18 Report to Dent. To find out your arrival time please call [phone] between 1PM - 3PM on Friday 10/17/18.  Remember: Instructions that are not followed completely may result in serious medical risk, up to and including death, or upon the discretion of your surgeon and anesthesiologist your surgery may need to be rescheduled.     _X__ 1. Do not eat food after midnight the night before your procedure.                 No gum chewing or hard candies. You may drink clear liquids up to 2 hours                 before you are scheduled to arrive for your surgery- DO not drink clear                 liquids within 2 hours of the start of your surgery.                 Clear Liquids include:  water, apple juice without pulp, clear carbohydrate                 drink such as Clearfast or Gatorade, Black Coffee or Tea (Do not add                 anything to coffee or tea). Diabetics water only  __X__2.  On the morning of surgery brush your teeth with toothpaste and water, you                 may rinse your mouth with mouthwash if you wish.  Do not swallow any              toothpaste of mouthwash.     _X__ 3.  No Alcohol for 24 hours before or after surgery.   _X__ 4.  Do Not Smoke or use e-cigarettes For 24 Hours Prior to Your Surgery.                 Do not use any chewable tobacco products for at least 6 hours prior to                 surgery.  ____  5.  Bring all medications with you on the day of surgery if instructed.   __X__  6.  Notify your doctor if there is any change in your medical condition      (cold, fever, infections).     Do not wear jewelry, make-up, hairpins, clips or nail polish. Do not wear lotions, powders, or perfumes.  Do not shave 48 hours prior to surgery. Men may shave face and neck. Do not bring valuables to the hospital.    [facility] is not responsible for any  belongings or valuables.  Contacts, dentures/partials or body piercings may not be worn into surgery. Bring a case for your contacts, glasses or hearing aids, a denture cup will be supplied. Leave your suitcase in the car. After surgery it may be brought to your room. For patients admitted to the hospital, discharge time is determined by your treatment team.   Patients discharged the day of surgery will not be allowed to drive home.   Please read over the following fact sheets that you were given:   MRSA Information  __X__ Take these medicines the morning of surgery with A SIP OF WATER:  1. allopurinol (ZYLOPRIM)  2. carvedilol (COREG)   3. pantoprazole (PROTONIX  4.  5.  6.  ____ Fleet Enema (as directed)   __X__ Use CHG Soap/SAGE wipes as directed  __X__ Use inhalers on the day of surgery  ____ Stop metformin/Janumet/Farxiga 2 days prior to surgery    __X__ Take 1/2 of usual insulin dose the night before surgery. No insulin the morning          of surgery.   ____ Stop Blood Thinners Coumadin/Plavix/Xarelto/Pleta/Pradaxa/Eliquis/Effient/Aspirin  on   Or contact your Surgeon, Cardiologist or Medical Doctor regarding  ability to stop your blood thinners  __X__ Stop Anti-inflammatories 7 days before surgery such as Advil, Ibuprofen, Motrin,  BC or Goodies Powder, Naprosyn, Naproxen, Aleve, Aspirin   TYLENOL IS OK  __X__ Stop all herbal supplements, fish oil or vitamin E until after surgery.    __X__ Bring C-Pap to the hospital.     RETURN Thursday 10/16/18 FOR YOUR COVID TEST 8 AM -10:30 AM. DRIVE UP TESTING IN FRONT OF THE [facility].

## 2018-10-13 NOTE — Pre-Procedure Instructions (Signed)
Patient states she was cleared by her cardiologist at the [facility] and Dr Serita Grit office has those notes. Secure chat with Dr Posey Pronto, he will have his staff find/work on getting those notes and send to PAT.

## 2018-10-14 ENCOUNTER — Encounter: Payer: Self-pay | Admitting: *Deleted

## 2018-10-14 LAB — URINE CULTURE: Culture: <10000 — AB

## 2018-10-14 NOTE — Pre-Procedure Instructions (Signed)
LABS CALLED AND FAXED TO CASE[facility] AT DR [facility]EL. WORSENING RENAL FUNCTION AND REQUEST FOR CLEARANCE FAXED

## 2018-10-16 ENCOUNTER — Other Ambulatory Visit
Admission: RE | Admit: 2018-10-16 | Discharge: 2018-10-16 | Disposition: A | Discharge status: Home / Self Care | Payer: No Typology Code available for payment source | Source: Ambulatory Visit | Attending: Orthopedic Surgery | Admitting: Orthopedic Surgery

## 2018-10-29 ENCOUNTER — Other Ambulatory Visit: Payer: Self-pay

## 2018-10-29 ENCOUNTER — Other Ambulatory Visit
Admission: RE | Admit: 2018-10-29 | Discharge: 2018-10-29 | Disposition: A | Discharge status: Home / Self Care | Payer: No Typology Code available for payment source | Source: Ambulatory Visit | Attending: Orthopedic Surgery | Admitting: Orthopedic Surgery

## 2018-10-29 DIAGNOSIS — Z1159 Encounter for screening for other viral diseases: Secondary | ICD-10-CM | POA: Insufficient documentation

## 2018-10-29 LAB — SARS CORONAVIRUS 2 (TAT 6-24 HRS): SARS Coronavirus 2: NEGATIVE

## 2018-10-30 ENCOUNTER — Other Ambulatory Visit: Payer: Non-veteran care

## 2018-10-31 NOTE — Pre-Procedure Instructions (Signed)
Note received from nephrology to indicate that it is OK to proceed with surgery. Condition stable. Cardiology also cleared for surgery.  Considers condition stable. Dr. Posey Pronto and Dr. Kayleen Memos discussed case and agreed to proceed with planned surgery.

## 2018-11-02 MED ORDER — CEFAZOLIN SODIUM-DEXTROSE 2-4 GM/100ML-% IV SOLN
2.0000 g | Freq: Once | INTRAVENOUS | Status: AC
  Administered 2018-11-03: 2 g via INTRAVENOUS

## 2018-11-03 ENCOUNTER — Other Ambulatory Visit: Payer: Self-pay

## 2018-11-03 ENCOUNTER — Inpatient Hospital Stay: Payer: No Typology Code available for payment source | Admitting: Anesthesiology

## 2018-11-03 ENCOUNTER — Inpatient Hospital Stay
Admission: RE | Admit: 2018-11-03 | Discharge: 2018-11-04 | DRG: 483 | Disposition: A | Discharge status: Home Health | Payer: No Typology Code available for payment source | Attending: Orthopedic Surgery | Admitting: Orthopedic Surgery

## 2018-11-03 ENCOUNTER — Encounter: Payer: Self-pay | Admitting: *Deleted

## 2018-11-03 ENCOUNTER — Encounter
Admission: RE | Disposition: A | Discharge status: Home Health | Payer: Self-pay | Source: Home / Self Care | Attending: Orthopedic Surgery

## 2018-11-03 ENCOUNTER — Inpatient Hospital Stay: Payer: No Typology Code available for payment source

## 2018-11-03 DIAGNOSIS — Z96619 Presence of unspecified artificial shoulder joint: Secondary | ICD-10-CM

## 2018-11-03 DIAGNOSIS — K219 Gastro-esophageal reflux disease without esophagitis: Secondary | ICD-10-CM | POA: Diagnosis present

## 2018-11-03 DIAGNOSIS — I509 Heart failure, unspecified: Secondary | ICD-10-CM | POA: Diagnosis present

## 2018-11-03 DIAGNOSIS — E785 Hyperlipidemia, unspecified: Secondary | ICD-10-CM | POA: Diagnosis present

## 2018-11-03 DIAGNOSIS — M75101 Unspecified rotator cuff tear or rupture of right shoulder, not specified as traumatic: Secondary | ICD-10-CM | POA: Diagnosis present

## 2018-11-03 DIAGNOSIS — Z955 Presence of coronary angioplasty implant and graft: Secondary | ICD-10-CM | POA: Diagnosis not present

## 2018-11-03 DIAGNOSIS — M109 Gout, unspecified: Secondary | ICD-10-CM | POA: Diagnosis present

## 2018-11-03 DIAGNOSIS — Z87891 Personal history of nicotine dependence: Secondary | ICD-10-CM | POA: Diagnosis not present

## 2018-11-03 DIAGNOSIS — E1122 Type 2 diabetes mellitus with diabetic chronic kidney disease: Secondary | ICD-10-CM | POA: Diagnosis present

## 2018-11-03 DIAGNOSIS — Z79899 Other long term (current) drug therapy: Secondary | ICD-10-CM

## 2018-11-03 DIAGNOSIS — I252 Old myocardial infarction: Secondary | ICD-10-CM | POA: Diagnosis not present

## 2018-11-03 DIAGNOSIS — E0590 Thyrotoxicosis, unspecified without thyrotoxic crisis or storm: Secondary | ICD-10-CM | POA: Diagnosis present

## 2018-11-03 DIAGNOSIS — I130 Hypertensive heart and chronic kidney disease with heart failure and stage 1 through stage 4 chronic kidney disease, or unspecified chronic kidney disease: Secondary | ICD-10-CM | POA: Diagnosis present

## 2018-11-03 DIAGNOSIS — N183 Chronic kidney disease, stage 3 (moderate): Secondary | ICD-10-CM | POA: Diagnosis present

## 2018-11-03 DIAGNOSIS — Z794 Long term (current) use of insulin: Secondary | ICD-10-CM | POA: Diagnosis not present

## 2018-11-03 DIAGNOSIS — G8929 Other chronic pain: Secondary | ICD-10-CM | POA: Diagnosis present

## 2018-11-03 DIAGNOSIS — E669 Obesity, unspecified: Secondary | ICD-10-CM | POA: Diagnosis present

## 2018-11-03 DIAGNOSIS — Z90710 Acquired absence of both cervix and uterus: Secondary | ICD-10-CM | POA: Diagnosis not present

## 2018-11-03 DIAGNOSIS — Z6841 Body Mass Index (BMI) 40.0 and over, adult: Secondary | ICD-10-CM | POA: Diagnosis not present

## 2018-11-03 HISTORY — PX: REVERSE SHOULDER ARTHROPLASTY: SHX5054

## 2018-11-03 LAB — URINE DRUG SCREEN, QUALITATIVE (ARMC ONLY)
Amphetamines, Ur Screen: NOT DETECTED
Barbiturates, Ur Screen: NOT DETECTED
Benzodiazepine, Ur Scrn: NOT DETECTED
Cannabinoid 50 Ng, Ur ~~LOC~~: NOT DETECTED
Cocaine Metabolite,Ur ~~LOC~~: NOT DETECTED
MDMA (Ecstasy)Ur Screen: NOT DETECTED
Methadone Scn, Ur: NOT DETECTED
Opiate, Ur Screen: NOT DETECTED
Phencyclidine (PCP) Ur S: NOT DETECTED
Tricyclic, Ur Screen: NOT DETECTED

## 2018-11-03 LAB — HEMOGLOBIN A1C
Hgb A1c MFr Bld: 7.6 % — ABNORMAL HIGH (ref 4.8–5.6)
Mean Plasma Glucose: 171.42 mg/dL

## 2018-11-03 LAB — GLUCOSE, CAPILLARY
Glucose-Capillary: 176 mg/dL — ABNORMAL HIGH (ref 70–99)
Glucose-Capillary: 177 mg/dL — ABNORMAL HIGH (ref 70–99)
Glucose-Capillary: 184 mg/dL — ABNORMAL HIGH (ref 70–99)

## 2018-11-03 SURGERY — ARTHROPLASTY, SHOULDER, TOTAL, REVERSE
Anesthesia: Regional | Laterality: Right

## 2018-11-03 MED ORDER — HYDROMORPHONE HCL 1 MG/ML IJ SOLN: 0.5000 mg | INTRAMUSCULAR | Status: DC | PRN

## 2018-11-03 MED ORDER — TIMOLOL MALEATE 0.5 % OP SOLN
1.0000 [drp] | Freq: Every day | OPHTHALMIC | Status: DC
  Administered 2018-11-04: 1 [drp] via OPHTHALMIC
  Filled 2018-11-03: qty 5

## 2018-11-03 MED ORDER — PHENYLEPHRINE HCL (PRESSORS) 10 MG/ML IV SOLN
INTRAVENOUS | Status: AC
  Filled 2018-11-03: qty 1

## 2018-11-03 MED ORDER — ALUM & MAG HYDROXIDE-SIMETH 200-200-20 MG/5ML PO SUSP: 30.0000 mL | ORAL | Status: DC | PRN

## 2018-11-03 MED ORDER — FENTANYL CITRATE (PF) 100 MCG/2ML IJ SOLN
INTRAMUSCULAR | Status: AC
  Filled 2018-11-03: qty 2

## 2018-11-03 MED ORDER — BUPIVACAINE HCL (PF) 0.5 % IJ SOLN: INTRAMUSCULAR | Status: DC | PRN

## 2018-11-03 MED ORDER — OXYCODONE HCL 5 MG PO TABS
5.0000 mg | ORAL_TABLET | ORAL | Status: DC | PRN
  Administered 2018-11-04: 10 mg via ORAL
  Filled 2018-11-03: qty 2

## 2018-11-03 MED ORDER — DIPHENHYDRAMINE HCL 12.5 MG/5ML PO ELIX: 12.5000 mg | ORAL_SOLUTION | ORAL | Status: DC | PRN

## 2018-11-03 MED ORDER — ACETAMINOPHEN 500 MG PO TABS
1000.0000 mg | ORAL_TABLET | Freq: Three times a day (TID) | ORAL | Status: DC
  Administered 2018-11-03 – 2018-11-04 (×3): 1000 mg via ORAL
  Filled 2018-11-03 (×3): qty 2

## 2018-11-03 MED ORDER — LIDOCAINE HCL (CARDIAC) PF 100 MG/5ML IV SOSY
PREFILLED_SYRINGE | INTRAVENOUS | Status: DC | PRN
  Administered 2018-11-03: 60 mg via INTRAVENOUS

## 2018-11-03 MED ORDER — BUPIVACAINE LIPOSOME 1.3 % IJ SUSP
INTRAMUSCULAR | Status: DC | PRN
  Administered 2018-11-03: 20 mL via PERINEURAL

## 2018-11-03 MED ORDER — FENTANYL CITRATE (PF) 100 MCG/2ML IJ SOLN
INTRAMUSCULAR | Status: AC
  Administered 2018-11-03: 25 ug via INTRAVENOUS
  Filled 2018-11-03: qty 2

## 2018-11-03 MED ORDER — BUPIVACAINE HCL (PF) 0.5 % IJ SOLN
INTRAMUSCULAR | Status: DC | PRN
  Administered 2018-11-03: 10 mL via PERINEURAL

## 2018-11-03 MED ORDER — FENTANYL CITRATE (PF) 100 MCG/2ML IJ SOLN
INTRAMUSCULAR | Status: AC
  Administered 2018-11-03: 07:00:00 50 ug via INTRAVENOUS
  Filled 2018-11-03: qty 2

## 2018-11-03 MED ORDER — INSULIN ASPART 100 UNIT/ML ~~LOC~~ SOLN
0.0000 [IU] | Freq: Three times a day (TID) | SUBCUTANEOUS | Status: DC
  Administered 2018-11-03: 2 [IU] via SUBCUTANEOUS
  Administered 2018-11-04: 3 [IU] via SUBCUTANEOUS
  Administered 2018-11-04: 2 [IU] via SUBCUTANEOUS
  Filled 2018-11-03 (×3): qty 1

## 2018-11-03 MED ORDER — METOCLOPRAMIDE HCL 10 MG PO TABS: 5.0000 mg | ORAL_TABLET | Freq: Three times a day (TID) | ORAL | Status: DC | PRN

## 2018-11-03 MED ORDER — INSULIN GLARGINE 100 UNIT/ML ~~LOC~~ SOLN
38.0000 [IU] | Freq: Every day | SUBCUTANEOUS | Status: DC
  Administered 2018-11-03: 25 [IU] via SUBCUTANEOUS
  Filled 2018-11-03 (×2): qty 0.38

## 2018-11-03 MED ORDER — ROCURONIUM BROMIDE 50 MG/5ML IV SOLN
INTRAVENOUS | Status: AC
  Filled 2018-11-03: qty 1

## 2018-11-03 MED ORDER — TRANEXAMIC ACID 1000 MG/10ML IV SOLN
INTRAVENOUS | Status: AC
  Filled 2018-11-03: qty 10

## 2018-11-03 MED ORDER — FUROSEMIDE 40 MG PO TABS
40.0000 mg | ORAL_TABLET | Freq: Two times a day (BID) | ORAL | Status: DC
  Administered 2018-11-03 – 2018-11-04 (×2): 40 mg via ORAL
  Filled 2018-11-03 (×2): qty 1

## 2018-11-03 MED ORDER — SODIUM CHLORIDE 0.9 % IV SOLN
INTRAVENOUS | Status: DC
  Administered 2018-11-03: 07:00:00 via INTRAVENOUS

## 2018-11-03 MED ORDER — LIDOCAINE HCL (PF) 2 % IJ SOLN
INTRAMUSCULAR | Status: AC
  Filled 2018-11-03: qty 10

## 2018-11-03 MED ORDER — ONDANSETRON HCL 4 MG/2ML IJ SOLN
INTRAMUSCULAR | Status: AC
  Filled 2018-11-03: qty 2

## 2018-11-03 MED ORDER — OXYCODONE HCL 5 MG PO TABS: 10.0000 mg | ORAL_TABLET | ORAL | Status: DC | PRN

## 2018-11-03 MED ORDER — VANCOMYCIN HCL 1000 MG IV SOLR
INTRAVENOUS | Status: AC
  Filled 2018-11-03: qty 1000

## 2018-11-03 MED ORDER — INSULIN ASPART 100 UNIT/ML ~~LOC~~ SOLN: 0.0000 [IU] | Freq: Every day | SUBCUTANEOUS | Status: DC

## 2018-11-03 MED ORDER — LACTATED RINGERS IV SOLN
INTRAVENOUS | Status: DC | PRN
  Administered 2018-11-03 (×2): via INTRAVENOUS

## 2018-11-03 MED ORDER — NEOMYCIN-POLYMYXIN B GU 40-200000 IR SOLN
Status: AC
  Filled 2018-11-03: qty 1

## 2018-11-03 MED ORDER — THROMBIN 5000 UNITS EX SOLR
CUTANEOUS | Status: AC
  Filled 2018-11-03: qty 5000

## 2018-11-03 MED ORDER — METOPROLOL TARTRATE 5 MG/5ML IV SOLN
5.0000 mg | INTRAVENOUS | Status: DC | PRN
  Filled 2018-11-03: qty 5

## 2018-11-03 MED ORDER — ASPIRIN EC 325 MG PO TBEC
325.0000 mg | DELAYED_RELEASE_TABLET | Freq: Every day | ORAL | Status: DC
  Administered 2018-11-04: 325 mg via ORAL
  Filled 2018-11-03: qty 1

## 2018-11-03 MED ORDER — ALLOPURINOL 100 MG PO TABS
100.0000 mg | ORAL_TABLET | Freq: Two times a day (BID) | ORAL | Status: DC
  Administered 2018-11-03 – 2018-11-04 (×2): 100 mg via ORAL
  Filled 2018-11-03 (×3): qty 1

## 2018-11-03 MED ORDER — DIFLUPREDNATE 0.05 % OP EMUL
1.0000 [drp] | Freq: Four times a day (QID) | OPHTHALMIC | Status: DC
  Administered 2018-11-03 – 2018-11-04 (×2): 1 [drp] via OPHTHALMIC
  Filled 2018-11-03 (×2): qty 5

## 2018-11-03 MED ORDER — MIDAZOLAM HCL 2 MG/2ML IJ SOLN
1.0000 mg | Freq: Once | INTRAMUSCULAR | Status: AC
  Administered 2018-11-03: 1 mg via INTRAVENOUS

## 2018-11-03 MED ORDER — PROPOFOL 10 MG/ML IV BOLUS
INTRAVENOUS | Status: DC | PRN
  Administered 2018-11-03: 120 mg via INTRAVENOUS
  Administered 2018-11-03: 40 mg via INTRAVENOUS

## 2018-11-03 MED ORDER — INSULIN ASPART 100 UNIT/ML ~~LOC~~ SOLN: 3.0000 [IU] | Freq: Three times a day (TID) | SUBCUTANEOUS | Status: DC

## 2018-11-03 MED ORDER — PHENOL 1.4 % MT LIQD
1.0000 | OROMUCOSAL | Status: DC | PRN
  Filled 2018-11-03: qty 177

## 2018-11-03 MED ORDER — PROPOFOL 10 MG/ML IV BOLUS
INTRAVENOUS | Status: AC
  Filled 2018-11-03: qty 20

## 2018-11-03 MED ORDER — DOCUSATE SODIUM 100 MG PO CAPS
100.0000 mg | ORAL_CAPSULE | Freq: Two times a day (BID) | ORAL | Status: DC
  Administered 2018-11-03 – 2018-11-04 (×2): 100 mg via ORAL
  Filled 2018-11-03 (×2): qty 1

## 2018-11-03 MED ORDER — ONDANSETRON HCL 4 MG PO TABS: 4.0000 mg | ORAL_TABLET | Freq: Four times a day (QID) | ORAL | Status: DC | PRN

## 2018-11-03 MED ORDER — SUCCINYLCHOLINE CHLORIDE 20 MG/ML IJ SOLN
INTRAMUSCULAR | Status: DC | PRN
  Administered 2018-11-03: 120 mg via INTRAVENOUS

## 2018-11-03 MED ORDER — CARVEDILOL 12.5 MG PO TABS
12.5000 mg | ORAL_TABLET | Freq: Two times a day (BID) | ORAL | Status: DC
  Administered 2018-11-03 – 2018-11-04 (×2): 12.5 mg via ORAL
  Filled 2018-11-03 (×2): qty 4

## 2018-11-03 MED ORDER — ALBUTEROL SULFATE (2.5 MG/3ML) 0.083% IN NEBU: 2.5000 mg | INHALATION_SOLUTION | Freq: Four times a day (QID) | RESPIRATORY_TRACT | Status: DC | PRN

## 2018-11-03 MED ORDER — SUCCINYLCHOLINE CHLORIDE 20 MG/ML IJ SOLN
INTRAMUSCULAR | Status: AC
  Filled 2018-11-03: qty 1

## 2018-11-03 MED ORDER — SENNOSIDES-DOCUSATE SODIUM 8.6-50 MG PO TABS
1.0000 | ORAL_TABLET | Freq: Two times a day (BID) | ORAL | Status: DC
  Administered 2018-11-03 – 2018-11-04 (×2): 1 via ORAL
  Filled 2018-11-03 (×2): qty 1

## 2018-11-03 MED ORDER — BISACODYL 10 MG RE SUPP: 10.0000 mg | Freq: Every day | RECTAL | Status: DC | PRN

## 2018-11-03 MED ORDER — LIDOCAINE HCL (PF) 1 % IJ SOLN
INTRAMUSCULAR | Status: AC
  Filled 2018-11-03: qty 5

## 2018-11-03 MED ORDER — ATORVASTATIN CALCIUM 20 MG PO TABS
40.0000 mg | ORAL_TABLET | Freq: Every day | ORAL | Status: DC
  Administered 2018-11-03: 40 mg via ORAL
  Filled 2018-11-03: qty 2

## 2018-11-03 MED ORDER — TOPIRAMATE 100 MG PO TABS
100.0000 mg | ORAL_TABLET | Freq: Every evening | ORAL | Status: DC
  Administered 2018-11-03: 100 mg via ORAL
  Filled 2018-11-03 (×2): qty 1

## 2018-11-03 MED ORDER — GLYCOPYRROLATE 0.2 MG/ML IJ SOLN
INTRAMUSCULAR | Status: AC
  Filled 2018-11-03: qty 1

## 2018-11-03 MED ORDER — LATANOPROST 0.005 % OP SOLN
1.0000 [drp] | Freq: Every day | OPHTHALMIC | Status: DC
  Administered 2018-11-03: 1 [drp] via OPHTHALMIC
  Filled 2018-11-03: qty 2.5

## 2018-11-03 MED ORDER — FENTANYL CITRATE (PF) 100 MCG/2ML IJ SOLN
50.0000 ug | Freq: Once | INTRAMUSCULAR | Status: AC
  Administered 2018-11-03: 50 ug via INTRAVENOUS

## 2018-11-03 MED ORDER — SPIRONOLACTONE 25 MG PO TABS
25.0000 mg | ORAL_TABLET | Freq: Every day | ORAL | Status: DC
  Administered 2018-11-04: 25 mg via ORAL
  Filled 2018-11-03: qty 1

## 2018-11-03 MED ORDER — BUPIVACAINE-EPINEPHRINE (PF) 0.25% -1:200000 IJ SOLN
INTRAMUSCULAR | Status: AC
  Filled 2018-11-03: qty 30

## 2018-11-03 MED ORDER — LIDOCAINE HCL (PF) 1 % IJ SOLN
INTRAMUSCULAR | Status: DC | PRN
  Administered 2018-11-03: 3 mL

## 2018-11-03 MED ORDER — BUPIVACAINE LIPOSOME 1.3 % IJ SUSP
INTRAMUSCULAR | Status: AC
  Filled 2018-11-03: qty 20

## 2018-11-03 MED ORDER — MIDAZOLAM HCL 2 MG/2ML IJ SOLN
INTRAMUSCULAR | Status: AC
  Administered 2018-11-03: 07:00:00 1 mg via INTRAVENOUS
  Filled 2018-11-03: qty 2

## 2018-11-03 MED ORDER — NITROGLYCERIN 0.6 MG/HR TD PT24
0.6000 mg | MEDICATED_PATCH | Freq: Every day | TRANSDERMAL | Status: DC
  Administered 2018-11-03 – 2018-11-04 (×2): 0.6 mg via TRANSDERMAL
  Filled 2018-11-03 (×2): qty 1

## 2018-11-03 MED ORDER — MOMETASONE FURO-FORMOTEROL FUM 200-5 MCG/ACT IN AERO
2.0000 | INHALATION_SPRAY | Freq: Two times a day (BID) | RESPIRATORY_TRACT | Status: DC
  Administered 2018-11-03 – 2018-11-04 (×2): 2 via RESPIRATORY_TRACT
  Filled 2018-11-03: qty 8.8

## 2018-11-03 MED ORDER — PHENYLEPHRINE HCL (PRESSORS) 10 MG/ML IV SOLN
INTRAVENOUS | Status: DC | PRN
  Administered 2018-11-03: 200 ug via INTRAVENOUS
  Administered 2018-11-03 (×2): 100 ug via INTRAVENOUS

## 2018-11-03 MED ORDER — METOCLOPRAMIDE HCL 5 MG/ML IJ SOLN: 5.0000 mg | Freq: Three times a day (TID) | INTRAMUSCULAR | Status: DC | PRN

## 2018-11-03 MED ORDER — ONDANSETRON HCL 4 MG/2ML IJ SOLN: 4.0000 mg | Freq: Four times a day (QID) | INTRAMUSCULAR | Status: DC | PRN

## 2018-11-03 MED ORDER — MENTHOL 3 MG MT LOZG
1.0000 | LOZENGE | OROMUCOSAL | Status: DC | PRN
  Filled 2018-11-03: qty 9

## 2018-11-03 MED ORDER — FLORANEX PO PACK
1.0000 g | PACK | Freq: Three times a day (TID) | ORAL | Status: DC
  Administered 2018-11-04: 1 g via ORAL
  Filled 2018-11-03 (×3): qty 1

## 2018-11-03 MED ORDER — ALBUTEROL SULFATE HFA 108 (90 BASE) MCG/ACT IN AERS: 2.0000 | INHALATION_SPRAY | Freq: Four times a day (QID) | RESPIRATORY_TRACT | Status: DC | PRN

## 2018-11-03 MED ORDER — BRIMONIDINE TARTRATE 0.2 % OP SOLN
1.0000 [drp] | Freq: Two times a day (BID) | OPHTHALMIC | Status: DC
  Administered 2018-11-03 – 2018-11-04 (×2): 1 [drp] via OPHTHALMIC
  Filled 2018-11-03: qty 5

## 2018-11-03 MED ORDER — CEFAZOLIN SODIUM-DEXTROSE 2-4 GM/100ML-% IV SOLN
2.0000 g | Freq: Four times a day (QID) | INTRAVENOUS | Status: AC
  Administered 2018-11-03 – 2018-11-04 (×3): 2 g via INTRAVENOUS
  Filled 2018-11-03 (×3): qty 100

## 2018-11-03 MED ORDER — GLYCOPYRROLATE 0.2 MG/ML IJ SOLN
INTRAMUSCULAR | Status: DC | PRN
  Administered 2018-11-03: 0.2 mg via INTRAVENOUS

## 2018-11-03 MED ORDER — VANCOMYCIN HCL 1000 MG IV SOLR
INTRAVENOUS | Status: DC | PRN
  Administered 2018-11-03: 1000 mg via TOPICAL

## 2018-11-03 MED ORDER — ONDANSETRON HCL 4 MG/2ML IJ SOLN: 4.0000 mg | Freq: Once | INTRAMUSCULAR | Status: DC | PRN

## 2018-11-03 MED ORDER — FENTANYL CITRATE (PF) 100 MCG/2ML IJ SOLN
25.0000 ug | INTRAMUSCULAR | Status: DC | PRN
  Administered 2018-11-03 (×3): 25 ug via INTRAVENOUS

## 2018-11-03 MED ORDER — SALINE SPRAY 0.65 % NA SOLN
2.0000 | Freq: Every day | NASAL | Status: DC | PRN
  Filled 2018-11-03: qty 44

## 2018-11-03 MED ORDER — SODIUM CHLORIDE 0.9 % IV SOLN
INTRAVENOUS | Status: DC
  Administered 2018-11-03: 16:00:00 via INTRAVENOUS

## 2018-11-03 MED ORDER — CEFAZOLIN SODIUM-DEXTROSE 2-4 GM/100ML-% IV SOLN
INTRAVENOUS | Status: AC
  Filled 2018-11-03: qty 100

## 2018-11-03 MED ORDER — LACTASE 3000 UNITS PO TABS
3000.0000 [IU] | ORAL_TABLET | Freq: Three times a day (TID) | ORAL | Status: DC
  Administered 2018-11-03 – 2018-11-04 (×3): 3000 [IU] via ORAL
  Filled 2018-11-03 (×4): qty 1

## 2018-11-03 MED ORDER — ROCURONIUM BROMIDE 100 MG/10ML IV SOLN
INTRAVENOUS | Status: DC | PRN
  Administered 2018-11-03: 20 mg via INTRAVENOUS
  Administered 2018-11-03: 30 mg via INTRAVENOUS
  Administered 2018-11-03: 10 mg via INTRAVENOUS

## 2018-11-03 MED ORDER — SUGAMMADEX SODIUM 200 MG/2ML IV SOLN
INTRAVENOUS | Status: DC | PRN
  Administered 2018-11-03 (×2): 100 mg via INTRAVENOUS

## 2018-11-03 MED ORDER — METHOCARBAMOL 1000 MG/10ML IJ SOLN
500.0000 mg | Freq: Four times a day (QID) | INTRAVENOUS | Status: DC | PRN
  Filled 2018-11-03: qty 5

## 2018-11-03 MED ORDER — NEOMYCIN-POLYMYXIN B GU 40-200000 IR SOLN
Status: DC | PRN
  Administered 2018-11-03: 16 mL

## 2018-11-03 MED ORDER — TRANEXAMIC ACID-NACL 1000-0.7 MG/100ML-% IV SOLN
1000.0000 mg | Freq: Once | INTRAVENOUS | Status: AC
  Administered 2018-11-03: 1000 mg via INTRAVENOUS
  Filled 2018-11-03: qty 100

## 2018-11-03 MED ORDER — METHOCARBAMOL 500 MG PO TABS: 500.0000 mg | ORAL_TABLET | Freq: Four times a day (QID) | ORAL | Status: DC | PRN

## 2018-11-03 MED ORDER — ONDANSETRON HCL 4 MG/2ML IJ SOLN
INTRAMUSCULAR | Status: DC | PRN
  Administered 2018-11-03: 4 mg via INTRAVENOUS

## 2018-11-03 MED ORDER — FENTANYL CITRATE (PF) 100 MCG/2ML IJ SOLN
INTRAMUSCULAR | Status: DC | PRN
  Administered 2018-11-03 (×2): 50 ug via INTRAVENOUS

## 2018-11-03 MED ORDER — METHIMAZOLE 5 MG PO TABS
5.0000 mg | ORAL_TABLET | Freq: Every day | ORAL | Status: DC
  Administered 2018-11-03 – 2018-11-04 (×2): 5 mg via ORAL
  Filled 2018-11-03 (×2): qty 1

## 2018-11-03 MED ORDER — TRANEXAMIC ACID-NACL 1000-0.7 MG/100ML-% IV SOLN
INTRAVENOUS | Status: DC | PRN
  Administered 2018-11-03: 1000 mg via INTRAVENOUS

## 2018-11-03 MED ORDER — PANTOPRAZOLE SODIUM 20 MG PO TBEC
20.0000 mg | DELAYED_RELEASE_TABLET | Freq: Two times a day (BID) | ORAL | Status: DC
  Administered 2018-11-03 – 2018-11-04 (×2): 20 mg via ORAL
  Filled 2018-11-03 (×3): qty 1

## 2018-11-03 MED ORDER — LACTINEX PO CHEW
1.0000 | CHEWABLE_TABLET | Freq: Two times a day (BID) | ORAL | Status: DC
  Filled 2018-11-03 (×2): qty 1

## 2018-11-03 MED ORDER — BUPIVACAINE HCL (PF) 0.5 % IJ SOLN
INTRAMUSCULAR | Status: AC
  Filled 2018-11-03: qty 10

## 2018-11-03 SURGICAL SUPPLY — 88 items
BASEPLATE P2 COATD GLND 6.5X30 (Shoulder) IMPLANT
BIT DRILL 2.5 DIA 127 CALI (BIT) ×2 IMPLANT
BIT DRILL 4 DIA CALIBRATED (BIT) ×2 IMPLANT
BIT DRILL GUIDE PATIENT MATCH (MISCELLANEOUS) IMPLANT
BLADE SAGITTAL WIDE XTHICK NO (BLADE) ×3 IMPLANT
CANISTER SUCT 1200ML W/VALVE (MISCELLANEOUS) ×3 IMPLANT
CANISTER SUCT 3000ML PPV (MISCELLANEOUS) ×6 IMPLANT
CHLORAPREP W/TINT 26 (MISCELLANEOUS) ×5 IMPLANT
CLOSURE WOUND 1/2 X4 (GAUZE/BANDAGES/DRESSINGS) ×1
CNTNR SPEC 2.5X3XGRAD LEK (MISCELLANEOUS)
CONT SPEC 4OZ STER OR WHT (MISCELLANEOUS)
CONTAINER SPEC 2.5X3XGRAD LEK (MISCELLANEOUS) ×1 IMPLANT
COOLER POLAR GLACIER W/PUMP (MISCELLANEOUS) ×3 IMPLANT
COVER BACK TABLE REUSABLE LG (DRAPES) ×3 IMPLANT
COVER WAND RF STERILE (DRAPES) ×3 IMPLANT
DRAPE 3/4 80X56 (DRAPES) ×6 IMPLANT
DRAPE IMP U-DRAPE 54X76 (DRAPES) ×2 IMPLANT
DRAPE INCISE IOBAN 66X45 STRL (DRAPES) ×6 IMPLANT
DRAPE SPLIT 6X30 W/TAPE (DRAPES) ×6 IMPLANT
DRAPE U-SHAPE 47X51 STRL (DRAPES) ×3 IMPLANT
DRILL GUIDE PATIENT MATCH (MISCELLANEOUS) ×3
DRSG OPSITE POSTOP 4X10 (GAUZE/BANDAGES/DRESSINGS) ×2 IMPLANT
DRSG OPSITE POSTOP 4X8 (GAUZE/BANDAGES/DRESSINGS) ×3 IMPLANT
DRSG TEGADERM 2-3/8X2-3/4 SM (GAUZE/BANDAGES/DRESSINGS) ×3 IMPLANT
ELECT BLADE 6.5 EXT (BLADE) IMPLANT
ELECT CAUTERY BLADE 6.4 (BLADE) ×1 IMPLANT
ELECT REM PT RETURN 9FT ADLT (ELECTROSURGICAL) ×3
ELECTRODE REM PT RTRN 9FT ADLT (ELECTROSURGICAL) ×1 IMPLANT
GAUZE XEROFORM 1X8 LF (GAUZE/BANDAGES/DRESSINGS) ×3 IMPLANT
GLOVE BIOGEL PI IND STRL 8 (GLOVE) ×2 IMPLANT
GLOVE BIOGEL PI INDICATOR 8 (GLOVE) ×4
GLOVE SURG ORTHO 8.0 STRL STRW (GLOVE) ×6 IMPLANT
GOWN STRL REUS W/ TWL LRG LVL3 (GOWN DISPOSABLE) ×2 IMPLANT
GOWN STRL REUS W/ TWL XL LVL3 (GOWN DISPOSABLE) ×1 IMPLANT
GOWN STRL REUS W/TWL LRG LVL3 (GOWN DISPOSABLE) ×4
GOWN STRL REUS W/TWL XL LVL3 (GOWN DISPOSABLE) ×2
HEMOVAC 400CC 10FR (MISCELLANEOUS) ×4 IMPLANT
HOOD PEEL AWAY FLYTE STAYCOOL (MISCELLANEOUS) ×9 IMPLANT
INSERT SMALL SOCKET 32MM (Insert) ×2 IMPLANT
KIT STABILIZATION SHOULDER (MISCELLANEOUS) ×3 IMPLANT
MASK FACE SPIDER DISP (MASK) ×3 IMPLANT
MAT ABSORB  FLUID 56X50 GRAY (MISCELLANEOUS) ×2
MAT ABSORB FLUID 56X50 GRAY (MISCELLANEOUS) ×1 IMPLANT
NDL REVERSE CUT 1/2 CRC (NEEDLE) ×1 IMPLANT
NDL SAFETY ECLIPSE 18X1.5 (NEEDLE) ×3 IMPLANT
NDL SPNL 20GX3.5 QUINCKE YW (NEEDLE) ×1 IMPLANT
NEEDLE HYPO 18GX1.5 SHARP (NEEDLE) ×6
NEEDLE REVERSE CUT 1/2 CRC (NEEDLE) ×3 IMPLANT
NEEDLE SPNL 20GX3.5 QUINCKE YW (NEEDLE) ×3 IMPLANT
NS IRRIG 1000ML POUR BTL (IV SOLUTION) ×3 IMPLANT
P2 COATDE GLNOID BSEPLT 6.5X30 (Shoulder) ×3 IMPLANT
PACK ARTHROSCOPY SHOULDER (MISCELLANEOUS) ×3 IMPLANT
PAD WRAPON POLAR SHDR UNIV (MISCELLANEOUS) ×1 IMPLANT
PAD WRAPON POLAR SHDR XLG (MISCELLANEOUS) IMPLANT
PASSER SUT SWANSON 36MM LOOP (INSTRUMENTS) IMPLANT
PENCIL SMOKE ULTRAEVAC 22 CON (MISCELLANEOUS) ×2 IMPLANT
PIN BONE FIXATION QR 3 (PIN) ×2 IMPLANT
PULSAVAC PLUS IRRIG FAN TIP (DISPOSABLE) ×3
RETRIEVER SUT HEWSON (MISCELLANEOUS) IMPLANT
SCREW BONE LOCKING RSP 5.0X30 (Screw) ×3 IMPLANT
SCREW BONE RSP LOCK 5X26 (Screw) IMPLANT
SCREW BONE RSP LOCK 5X30 (Screw) IMPLANT
SCREW BONE RSP LOCKING 5.0X26 (Screw) ×6 IMPLANT
SCREW RETAIN W/HEAD 4MM OFFSET (Shoulder) ×2 IMPLANT
SLING ULTRA II LG (MISCELLANEOUS) ×1 IMPLANT
SLING ULTRA II M (MISCELLANEOUS) ×1 IMPLANT
SPONGE GAUZE 2X2 8PLY STER LF (GAUZE/BANDAGES/DRESSINGS) ×1
SPONGE GAUZE 2X2 8PLY STRL LF (GAUZE/BANDAGES/DRESSINGS) ×2 IMPLANT
SPONGE LAP 18X18 RF (DISPOSABLE) ×6 IMPLANT
STAPLER SKIN PROX 35W (STAPLE) IMPLANT
STEM HUMERAL SM SHELL 12X48 (Stem) IMPLANT
STEM HUMERAL SMALL SHELL 12X48 (Stem) ×3 IMPLANT
STRAP SAFETY 5IN WIDE (MISCELLANEOUS) ×6 IMPLANT
STRIP CLOSURE SKIN 1/2X4 (GAUZE/BANDAGES/DRESSINGS) ×2 IMPLANT
SUT FIBERWIRE #2 38 BLUE 1/2 (SUTURE) ×18
SUT MNCRL AB 4-0 PS2 18 (SUTURE) IMPLANT
SUT PROLENE 6 0 P 1 18 (SUTURE) ×3 IMPLANT
SUT TICRON 2-0 30IN 311381 (SUTURE) ×9 IMPLANT
SUT VIC AB 0 CT1 36 (SUTURE) ×3 IMPLANT
SUT VIC AB 2-0 CT2 27 (SUTURE) ×6 IMPLANT
SUTURE FIBERWR #2 38 BLUE 1/2 (SUTURE) ×4 IMPLANT
SYR 20ML LL LF (SYRINGE) ×3 IMPLANT
SYR 30ML LL (SYRINGE) ×6 IMPLANT
SYR 5ML LL (SYRINGE) ×3 IMPLANT
TIP FAN IRRIG PULSAVAC PLUS (DISPOSABLE) ×1 IMPLANT
TRAY FOLEY SLVR 16FR LF STAT (SET/KITS/TRAYS/PACK) IMPLANT
WRAPON POLAR PAD SHDR UNIV (MISCELLANEOUS)
WRAPON POLAR PAD SHDR XLG (MISCELLANEOUS) ×3

## 2018-11-03 NOTE — Transfer of Care (Signed)
Immediate Anesthesia Transfer of Care Note  Patient: Katrina Hughes  Procedure(s) Performed: REVERSE SHOULDER ARTHROPLASTY, BICEPS TENODESIS (Right )  Patient Location: PACU  Anesthesia Type:General  Level of Consciousness: awake, alert  and oriented  Airway & Oxygen Therapy: Patient Spontanous Breathing and Patient connected to face mask oxygen  Post-op Assessment: Report given to RN and Post -op Vital signs reviewed and stable  Post vital signs: Reviewed and stable  Last Vitals:  Vitals Value Taken Time  BP 172/99 11/03/18 1145  Temp    Pulse 62 11/03/18 1149  Resp 18 11/03/18 1149  SpO2 97 % 11/03/18 1149  Vitals shown include unvalidated device data.  Last Pain:  Vitals:   11/03/18 0723  TempSrc:   PainSc: 0-No pain         Complications: No apparent anesthesia complications

## 2018-11-03 NOTE — Discharge Instructions (Signed)
Cato Mulligan, MD  [facility]  Phone: [phone]  Fax: [phone]   Discharge Instructions after Reverse Shoulder Replacement   1. Activity/Sling: You are to be non-weight bearing on operative extremity. A sling/shoulder immobilizer has been provided for you. Only remove the sling to perform elbow, wrist, and hand RoM exercises and hygiene/dressing. Active reaching and lifting are not permitted. You will be given further instructions on sling use at your first physical therapy visit and postoperative visit with Dr. Posey Pronto.   2. Dressings: Dressing may be removed at 1st physical therapy visit (~3-4 days after surgery). Afterwards, you may either leave open to air (if no drainage) or cover with dry, sterile dressing. If you have steri-strips on your wound, please do not remove them. They will fall off on their own. You may shower 5 days after surgery. Please pat incision dry. Do not rub or place any shear forces across incision. If there is drainage or any opening of incision after 5 days, please notify our offices immediately.   3. Driving:  Plan on not driving for six weeks. Please note that you are advised NOT to drive while taking narcotic pain medications as you may be impaired and unsafe to drive.  4. Medications:  - You have been provided a prescription for narcotic pain medicine (usually oxycodone). After surgery, take 1-2 narcotic tablets every 4 hours if needed for severe pain. Please start this as soon as you begin to start having pain (if you received a nerve block, start taking as soon as this wears off).  - A prescription for anti-nausea medication will be provided in case the narcotic medicine causes nausea - take 1 tablet every 6 hours only if nauseated.  - Take enteric coated aspirin 325 mg once daily for 6 weeks to prevent blood clots. Do not take aspirin if you have an aspirin sensitivity/allergy or asthma or are on an anticoagulant (blood thinner) already. If so, then  your home anticoagulant will be resume and managed - do not take aspirin. -Take tylenol 1000mg  (2 Extra strength or 3 regular strength tablets) every 8 hours for pain. This will reduce the amount of narcotic medication needed. May stop tylenol when you are having minimal pain. - Take a stool softener (Colace, Dulcolax or Senakot) if you are using narcotic pain medications to help with constipation that is associated with narcotic use. - DO NOT take ANY nonsteroidal anti-inflammatory pain medications: Advil, Motrin, Ibuprofen, Aleve, Naproxen, or Naprosyn.  If you are taking prescription medication for anxiety, depression, insomnia, muscle spasm, chronic pain, or for attention deficit disorder you are advised that you are at a higher risk of adverse effects with use of narcotics post-op, including narcotic addiction/dependence, depressed breathing, death. If you use non-prescribed substances: alcohol, marijuana, cocaine, heroin, methamphetamines, etc., you are at a higher risk of adverse effects with use of narcotics post-op, including narcotic addiction/dependence, depressed breathing, death. You are advised that taking > 50 morphine milligram equivalents (MME) of narcotic pain medication per day results in twice the risk of overdose or death. For your prescription provided: oxycodone 5 mg - taking more than 6 tablets per day after the first few days of surgery.  5. Physical Therapy: 1-2 times per week for ~12 weeks. Therapy typically starts on post operative Day 3 or 4. You have been provided an order for physical therapy. The therapist will provide home exercises. Please contact our offices if you do not have an appointment.   6. Work: May do  light duty/desk job in approximately 2 weeks when off of narcotics, pain is well-controlled, and swelling has decreased if able to function with one arm in sling. Full work may take 6 weeks if light motions and function of both arms is required. Lifting jobs  may require 12 weeks.  7. Post-Op Appointments: Your first post-op appointment will be with Dr. Posey Pronto in approximately 2 weeks time.   If you find that they have not been scheduled please call the Orthopaedic Appointment front desk at [phone].                               Cato Mulligan, MD [facility] Phone: [phone] Fax: [phone]   REVERSE SHOULDER ARTHROPLASTY REHAB GUIDELINES   These guidelines should be tailored to individual patients based on their rehab goals, age, precautions, quality of repair, etc.  Progression should be based on patient progress and approval by the referring physician.  PHASE 1 - Day 1 through Week 2  GENERAL GUIDELINES AND PRECAUTIONS  Sling wear 24/7 except during grooming and home exercises (3 to 5 times daily)  Avoid shoulder extension such that the arm is posterior the frontal plane.  When patients recline, a pillow should be placed behind the upper arm and sling should be on.  They should be advised to always be able to see the elbow  Avoid combined IR/ADD/EXT, such as hand behind back to prevent dislocation  Avoid combined IR and ADD such as reaching across the chest to prevent dislocation  No AROM  No submersion in pool/water for 4 weeks  No weight bearing through operative arm (as in transfers, walker use, etc)  GOALS  Maintain integrity of joint replacement; protect soft tissue healing  Increase PROM for elevation to 120 and ER to 30 (will remain the goal for first 6 weeks)  Optimize distal UE circulation and muscle activity (elbow, wrist and hand)  Instruct in use of sling for proper fit, polar care device for ice application after HEP, signs/symptoms of infection  EXERCISES  Active elbow, wrist and hand  Passive forward elevation in scapular plane to 90-120 max motion; ER in scapular plane to 30  Active scapular retraction with arms resting in neutral position  CRITERIA TO  PROGRESS TO PHASE 2  Low pain (less than 3/10) with shoulder PROM  Healing of incision without signs of infection  Clearance by MD to advance after 2 week MD check up  PHASE 2 - 2 weeks - 6 weeks  GENERAL GUIDELINES AND PRECAUTIONS  Sling may be removed while at home; worn in community without abduction pillow  May use arm for light activities of daily living (such as feeding, brushing teeth, dressing) with elbow near  the side of the body  and arm in front of the body- no active lifting of the arm  May submerge in water (tub, pool, [place], etc) after 4 weeks  Continue to avoid WBing through the operative arm  Continue to avoid combined IR/EXT/ADD (hand behind the back) and IR/ADD  (reaching across chest) for dislocation precautions  GOALS   Achieve passive elevation to 120 and ER to 30   Low (less than 3/10) to no pain   Ability to fire all heads of the deltoid  EXERCISES  May discontinue grip, and active elbow and wrist exercises since using the arm in ADLs  with sling removed around the home  Continue passive elevation to 120 and  ER to 30, both in scapular plane with arm supported on table top  Add submaximal isometrics, pain free effort, for all functional heads of deltoid (anterior, posterior, middle)  Ensure that with posterior deltoid isometric the shoulder does not move into extension and the arm remains anterior the frontal plane  At 4 weeks:  begin to place arm in balanced position of 90 deg elevation in supine; when patient able to hold this position with ease, may begin reverse pendulums clockwise and counterclockwise  CRITERIA TO PROGRESS TO PHASE 3  Passive forward elevation in scapular plane to 120; passive ER in scapular plane to 30  Ability to fire isometrically all heads of the deltoid muscle without pain  Ability to place and hold the arm in balanced position (90 deg elevation in supine)  PHASE 3 - 6 weeks to 3 months  GENERAL GUIDELINES AND  PRECAUTIONS  Discontinue use of sling  Avoid forcing end range motion in any direction to prevent dislocation   May advance use of the arm actively in ADLs without being restricted to arm by the side of the body, however, avoid heavy lifting and sports (forever!)  May initiate functional IR behind the back gently  NO UPPER BODY ERGOMETER   GOALS  Optimize PROM for elevation and ER in scapular plane with realistic expectation that max  mobility for elevation is usually around 145-160 passively; ER 40 to 50 passively; functional IR to L1  Recover AROM to approach as close to PROM available as possible; may expect 135-150 deg active elevation; 30 deg active ER; active functional IR to L1  Establish dynamic stability of the shoulder with deltoid and periscapular muscle gradual strengthening  EXERCISES  Forward elevation in scapular plane active progression: supine to incline, to vertical; short to long lever arm  Balanced position long lever arm AROM  Active ER/IR with arm at side  Scapular retraction with light band resistance  Functional IR with hand slide up back - very gentle and gradual  NO UPPER BODY ERGOMETER     CRITERIA TO PROGRESS TO PHASE 4   AROM equals/approaches PROM with good mechanics for elevation    No pain   Higher level demand on shoulder than ADL functions   PHASE 4 12 months and beyond  GENERAL GUIDELINES AND PRECAUTIONS  No heavy lifting and no overhead sports  No heavy pushing activity  Gradually increase strength of deltoid and scapular stabilizers; also the rotator cuff if present with weights not to exceed 5 lbs  NO UPPER BODY ERGOMETER   GOALS   Optimize functional use of the operative UE to meet the desired demands   Gradual increase in deltoid, scapular muscle, and rotator cuff strength   Pain free functional activities   EXERCISES  Add light hand weights for deltoid up to and not to exceed 3 lbs for anterior and posterior  with long arm lift against gravity; elbow bent to 90 deg for abduction in scapular plane  Theraband progression for extension to hip with scapular depression/retraction  Theraband progression for serratus anterior punches in supine; avoid wall, incline or prone pressups for serratus anterior  End range stretching gently without forceful overpressure in all planes (elevation in scapular plane, ER in scapular plane, functional IR) with stretching done for life as part of a daily routine  NO UPPER BODY ERGOMETER     CRITERIA FOR DISCHARGE FROM SKILLED PHYSICAL THERAPY   Pain free AROM for shoulder elevation (expect around 135-150)   Functional  strength for all ADLs, work tasks, and hobbies approved by Psychologist, sport and exercise   Independence with home maintenance program   NOTES: 1. With proper exercise, motion, strength, and function continue to improve even after one year. 2. The complication rate after surgery is 5 - 8%. Complications include infection, fracture, heterotopic bone formation, nerve injury, instability, rotator cuff tear, and tuberosity nonunion. Please look for clinical signs, unusual symptoms, or lack of progress with therapy and report those to Dr. Posey Pronto. Prefer more communication than less.  3. The therapy plan above only serves as a guide. Please be aware of specific individualized patient instructions as written on the prescription or through discussions with the surgeon. 4. Please call Dr. Posey Pronto if you have any specific questions or concerns [phone]

## 2018-11-03 NOTE — Progress Notes (Signed)
Patient admitted to unit from PACU, awake and alert. IV LR infusing via left arm.  Patient presented with bilateral SCD,s right shoulder sling, polar care to right shoulder.  Patient situated in bed for comfort. No c/o pain at this time. Pt received exparel  For procedure.

## 2018-11-03 NOTE — Anesthesia Postprocedure Evaluation (Signed)
Anesthesia Post Note  Patient: Katrina Hughes  Procedure(s) Performed: REVERSE SHOULDER ARTHROPLASTY, BICEPS TENODESIS (Right )  Patient location during evaluation: PACU Anesthesia Type: Regional and General Level of consciousness: awake and alert and oriented Pain management: pain level controlled Vital Signs Assessment: post-procedure vital signs reviewed and stable Respiratory status: spontaneous breathing, nonlabored ventilation and respiratory function stable Cardiovascular status: blood pressure returned to baseline and stable Postop Assessment: no signs of nausea or vomiting Anesthetic complications: no     Last Vitals:  Vitals:   11/03/18 1300 11/03/18 1315  BP: (!) 149/74 (!) 144/77  Pulse: 69 69  Resp: 16 16  Temp:    SpO2: 94% 95%    Last Pain:  Vitals:   11/03/18 1315  TempSrc:   PainSc: Asleep                 Montgomery Rothlisberger

## 2018-11-03 NOTE — Anesthesia Procedure Notes (Signed)
Anesthesia Regional Block: Interscalene brachial plexus block   Pre-Anesthetic Checklist: ,, timeout performed, Correct Patient, Correct Site, Correct Laterality, Correct Procedure, Correct Position, site marked, Risks and benefits discussed,  Surgical consent,  Pre-op evaluation,  At surgeon's request and post-op pain management  Laterality: Right  Prep: chloraprep       Needles:  Injection technique: Single-shot  Needle Type: Stimiplex     Needle Length: 10cm  Needle Gauge: 21     Additional Needles:   Procedures:,,,, ultrasound used (permanent image in chart),,,,  Narrative:  Start time: 11/03/2018 7:20 AM End time: 11/03/2018 7:25 AM Injection made incrementally with aspirations every 5 mL.  Performed by: Personally  Anesthesiologist: Emmie Niemann, MD  Additional Notes: Functioning IV was confirmed and monitors were applied.  A Stimuplex needle was used. Sterile prep and drape,hand hygiene and sterile gloves were used.  Negative aspiration and negative test dose prior to incremental administration of local anesthetic. The patient tolerated the procedure well.

## 2018-11-03 NOTE — H&P (Signed)
Paper H&P to be scanned into permanent record. H&P reviewed. No significant changes noted.  

## 2018-11-03 NOTE — Anesthesia Preprocedure Evaluation (Addendum)
Anesthesia Evaluation  Patient identified by MRN, date of birth, ID band Patient awake    Reviewed: Allergy & Precautions, NPO status , Patient's Chart, lab work & pertinent test results  History of Anesthesia Complications (+) PROLONGED EMERGENCE and history of anesthetic complications  Airway Mallampati: II  TM Distance: >3 FB Neck ROM: Full    Dental  (+) Edentulous Upper, Poor Dentition, Missing   Pulmonary sleep apnea and Continuous Positive Airway Pressure Ventilation , neg COPD, former smoker,    breath sounds clear to auscultation- rhonchi (-) wheezing      Cardiovascular hypertension, Pt. on medications + Past MI, + Cardiac Stents and +CHF (preserved EF)   Rhythm:Regular Rate:Normal - Systolic murmurs and - Diastolic murmurs    Neuro/Psych neg Seizures negative neurological ROS  negative psych ROS   GI/Hepatic Neg liver ROS, GERD  ,  Endo/Other  diabetes, Insulin Dependent  Renal/GU CRFRenal disease     Musculoskeletal negative musculoskeletal ROS (+)   Abdominal (+) + obese,   Peds  Hematology negative hematology ROS (+)   Anesthesia Other Findings Past Medical History: No date: Anginal pain (HCC) No date: Bell palsy No date: CHF (congestive heart failure) (HCC) No date: Chronic kidney disease No date: Complication of anesthesia     Comment:  slow to wake up No date: Diabetes mellitus without complication (HCC) No date: Dyspnea No date: Environmental and seasonal allergies No date: GERD (gastroesophageal reflux disease) No date: Glaucoma No date: Hypertension No date: Hyperthyroidism No date: Myocardial infarction ([place]) No date: Sleep apnea   Reproductive/Obstetrics                             Anesthesia Physical Anesthesia Plan  ASA: III  Anesthesia Plan: General   Post-op Pain Management:  Regional for Post-op pain   Induction: Intravenous  PONV Risk Score  and Plan: 2 and Ondansetron, Dexamethasone and Midazolam  Airway Management Planned: Oral ETT  Additional Equipment:   Intra-op Plan:   Post-operative Plan: Extubation in OR  Informed Consent: I have reviewed the patients History and Physical, chart, labs and discussed the procedure including the risks, benefits and alternatives for the proposed anesthesia with the patient or authorized representative who has indicated his/her understanding and acceptance.     Dental advisory given  Plan Discussed with: CRNA and Anesthesiologist  Anesthesia Plan Comments:         Anesthesia Quick Evaluation

## 2018-11-03 NOTE — Consult Note (Signed)
Reason for Consult: Medical management Referring Physician: Dr. Rhetta Mura Katrina Hughes is an 68 y.o. female.  With a history of congestive heart failure chronic kidney disease diabetes mellitus, GERD and hypertension and sleep apnea has had right-sided reverse total shoulder arthroplasty by Dr. Posey Pronto and hospitalist team is consulted for medical management.  Patient was seen and examined in the PACU after surgery.  Patient was still lethargic but opening her eyes to verbal commands and falling asleep.  No family members at bedside.   Past Medical History:  Diagnosis Date  . Anginal pain ([place])   . Bell palsy   . CHF (congestive heart failure) ([place])   . Chronic kidney disease   . Complication of anesthesia    slow to wake up  . Diabetes mellitus without complication ([place])   . Dyspnea   . Environmental and seasonal allergies   . GERD (gastroesophageal reflux disease)   . Glaucoma   . Hypertension   . Hyperthyroidism   . Myocardial infarction ([place])   . Sleep apnea     Past Surgical History:  Procedure Laterality Date  . ABDOMINAL HYSTERECTOMY    . APPENDECTOMY    . BICEPT TENODESIS Left 06/10/2017   Procedure: BICEPS TENODESIS;  Surgeon: Leim Fabry, MD;  Location: [facility] ORS;  Service: Orthopedics;  Laterality: Left;  . BREAST REDUCTION SURGERY    . CATARACT EXTRACTION, BILATERAL Bilateral   . CHOLECYSTECTOMY    . CORONARY ANGIOPLASTY WITH STENT PLACEMENT    . HERNIA REPAIR    . NASAL HEMORRHAGE CONTROL N/A 09/17/2017   Procedure: EPISTAXIS CONTROL;  Surgeon: [name], MD;  Location: [facility];  Service: ENT;  Laterality: N/A;  . REVERSE SHOULDER ARTHROPLASTY Left 06/10/2017   Procedure: REVERSE SHOULDER ARTHROPLASTY;  Surgeon: Leim Fabry, MD;  Location: [facility] ORS;  Service: Orthopedics;  Laterality: Left;    History reviewed. No pertinent family history.  Social History:  reports that she quit smoking about 40 years ago. She has never used smokeless tobacco. She reports  previous alcohol use. She reports previous drug use. Drug: Marijuana.  Allergies:  Allergies  Allergen Reactions  . Sulfur Other (See Comments)    Sulfur based products Skin and stomach irritation  . Aspirin Other (See Comments)    Ulcers in stomach    Medications: I have reviewed the patient's current medications.  Results for orders placed or performed during the hospital encounter of 11/03/18 (from the past 48 hour(s))  Urine Drug Screen, Qualitative ([facility] only)     Status: None   Collection Time: 11/03/18  6:15 AM  Result Value Ref Range   Tricyclic, Ur Screen NONE DETECTED NONE DETECTED   Amphetamines, Ur Screen NONE DETECTED NONE DETECTED   MDMA (Ecstasy)Ur Screen NONE DETECTED NONE DETECTED   Cocaine Metabolite,Ur [place] NONE DETECTED NONE DETECTED   Opiate, Ur Screen NONE DETECTED NONE DETECTED   Phencyclidine (PCP) Ur S NONE DETECTED NONE DETECTED   Cannabinoid 50 Ng, Ur [place] NONE DETECTED NONE DETECTED   Barbiturates, Ur Screen NONE DETECTED NONE DETECTED   Benzodiazepine, Ur Scrn NONE DETECTED NONE DETECTED   Methadone Scn, Ur NONE DETECTED NONE DETECTED    Comment: (NOTE) Tricyclics + metabolites, urine    Cutoff 1000 ng/mL Amphetamines + metabolites, urine  Cutoff 1000 ng/mL MDMA (Ecstasy), urine              Cutoff 500 ng/mL Cocaine Metabolite, urine          Cutoff 300 ng/mL Opiate + metabolites,  urine        Cutoff 300 ng/mL Phencyclidine (PCP), urine         Cutoff 25 ng/mL Cannabinoid, urine                 Cutoff 50 ng/mL Barbiturates + metabolites, urine  Cutoff 200 ng/mL Benzodiazepine, urine              Cutoff 200 ng/mL Methadone, urine                   Cutoff 300 ng/mL The urine drug screen provides only a preliminary, unconfirmed analytical test result and should not be used for non-medical purposes. Clinical consideration and professional judgment should be applied to any positive drug screen result due to possible interfering substances. A more  specific alternate chemical method must be used in order to obtain a confirmed analytical result. Gas chromatography / mass spectrometry (GC/MS) is the preferred confirmat ory method. Performed at [facility], [address] [place], [place] [address]   Glucose, capillary     Status: Abnormal   Collection Time: 11/03/18  6:19 AM  Result Value Ref Range   Glucose-Capillary 176 (H) 70 - 99 mg/dL  Glucose, capillary     Status: Abnormal   Collection Time: 11/03/18 11:51 AM  Result Value Ref Range   Glucose-Capillary 177 (H) 70 - 99 mg/dL    Dg Shoulder Right Port  Result Date: 11/03/2018 CLINICAL DATA:  Patient status post shoulder replacement today. EXAM: PORTABLE RIGHT SHOULDER COMPARISON:  CT right shoulder 08/25/2018 FINDINGS: Reverse shoulder arthroplasty is in place. Device is located. No fracture. Surgical staples noted. IMPRESSION: Status post reverse shoulder arthroplasty today. No acute abnormality. Electronically Signed   By: Inge Rise M.D.   On: 11/03/2018 13:05    ROS: Review of system unobtainable as the patient is still lethargic from anesthesia postoperatively    Blood pressure (!) 150/82, pulse 75, temperature 98.2 F (36.8 C), resp. rate 15, height 5\' 5"  (1.651 m), weight 112 kg, SpO2 97 %.   PHYSICAL EXAMINATION:  GENERAL: Well-nourished, well-developed  currently in no acute distress.  HEAD: Normocephalic, atraumatic.  EYES: Pupils equal, round, and reactive to light.  No scleral icterus.  MOUTH: Moist mucosal membranes. Dentition intact. No abscess noted. EARS, NOSE, THROAT: Clear without exudates. No external lesions.  NECK: Supple. No thyromegaly. No nodules. No JVD.  PULMONARY: Clear to auscultation bilaterally without wheezes, rales, or rhonchi. No use of accessory muscles. Good respiratory effort. CHEST: Nontender to palpation.  CARDIOVASCULAR: S1, S2, regular rate and rhythm. No murmurs, rubs, or gallops.  GASTROINTESTINAL: Soft,  nontender, nondistended.Marland Kitchen Positive bowel sounds. No hepatosplenomegaly. MUSCULOSKELETAL: Right shoulder in sling, polar ice, status post surgery no swelling, clubbing, edema.  NEUROLOGIC: Lethargic. SKIN: No ulcerations, lesions, rash, cyanosis. Skin warm, dry. Turgor intact.    Assessment/Plan:    #Diabetes mellitus Currently patient is lethargic from anesthesia Provide her sliding scale insulin Check A1c in a.m. Lantus  -History of CHF continue Lasix Continue home medication Coreg Monitor for symptoms and signs of fluid overload  #Hyperlipidemia continue statin once patient is more awake and alert, tolerating diet   #Status post  right reverse shoulder arthroplasty Care per primary team Dr. Leim Fabry Pain management per orthopedics  #Chronic kidney disease Continue close monitoring of the renal function and avoid nephrotoxins Gentle hydration with IV fluids provided during surgery  Thank you Dr. Posey Pronto for allowing hospitalist team to take care of this patient  TOTAL TIME TAKING CARE OF THIS PATIENT: 41  minutes.   Note: This dictation was prepared with Dragon dictation along with smaller phrase technology. Any transcriptional errors that result from this process are unintentional.   @[facility] @ Pager - [phone] 11/03/2018, 2:54 PM

## 2018-11-03 NOTE — Anesthesia Procedure Notes (Signed)
Procedure Name: Intubation Date/Time: 11/03/2018 7:42 AM Performed by: Rona Ravens, CRNA Pre-anesthesia Checklist: Patient identified, Emergency Drugs available, Patient being monitored and Suction available Patient Re-evaluated:Patient Re-evaluated prior to induction Oxygen Delivery Method: Circle system utilized Preoxygenation: Pre-oxygenation with 100% oxygen Induction Type: IV induction and Rapid sequence Laryngoscope Size: Mac and 4 Grade View: Grade I Tube type: Oral Tube size: 7.5 mm Number of attempts: 1 Airway Equipment and Method: Stylet Placement Confirmation: ETT inserted through vocal cords under direct vision,  positive ETCO2,  CO2 detector and breath sounds checked- equal and bilateral Secured at: 22 cm Tube secured with: Tape Dental Injury: Teeth and Oropharynx as per pre-operative assessment

## 2018-11-03 NOTE — Progress Notes (Signed)
Foley placed prior to surgery and remains intact at this time.  Hemovac in place to right shoulder incision.

## 2018-11-03 NOTE — Anesthesia Post-op Follow-up Note (Signed)
Anesthesia QCDR form completed.        

## 2018-11-03 NOTE — Evaluation (Signed)
Physical Therapy Evaluation Patient Details Name: Katrina Hughes MRN: [id number] DOB: 14-Jan-1951 Today's Date: 11/03/2018   History of Present Illness  Pt is a 68 yo F with a PMH that includes CHF, CKD, DM, GERD, MI, and HTN who is now s/p elective R reverse TSA and R biceps tenodesis.    Clinical Impression  Pt presents with deficits in strength, transfers, mobility, gait, balance, and activity tolerance but overall performed well considering POD# 0 status.  Pt required mod A with bed mobility tasks along with cues for sequencing and presented with good static sitting balance at the EOB.  Pt was CGA with sit to/from stand transfers with good eccentric and concentric control.  Pt was able to perform standing therex and take several steps with a HW and CGA with good stability throughout.  Pt should continue to make good progress while in acute care and will likely be appropriate for OPPT per surgeon's recommendation.        Follow Up Recommendations Outpatient PT;Follow surgeon's recommendation for DC plan and follow-up therapies    Equipment Recommendations  None recommended by PT    Recommendations for Other Services       Precautions / Restrictions Precautions Precautions: Fall;Shoulder Shoulder Interventions: Shoulder sling/immobilizer Required Braces or Orthoses: Sling Restrictions Weight Bearing Restrictions: Yes RUE Weight Bearing: Non weight bearing Other Position/Activity Restrictions: OK for RUE pendulums, R shoulder PROM to 90 deg FF and 30 deg ER.  OK for hand/wrist ROM, NO elbow AROM secondary to tenodesis per MD      Mobility  Bed Mobility Overal bed mobility: Needs Assistance Bed Mobility: Supine to Sit     Supine to sit: Mod assist;HOB elevated     General bed mobility comments: Min verbal cues for sequencing and then mod A for BLEs out of bed and trunk to full upright position  Transfers Overall transfer level: Needs assistance Equipment used:  Hemi-walker Transfers: Sit to/from Stand Sit to Stand: Min guard         General transfer comment: Good eccentric and concentric control with transfers  Ambulation/Gait Ambulation/Gait assistance: Min guard Gait Distance (Feet): 5 Feet Assistive device: Hemi-walker Gait Pattern/deviations: Step-to pattern;Decreased step length - right;Decreased step length - left Gait velocity: decreased   General Gait Details: Mod verbal cues for amb with the HW but steady without LOB  Stairs            Wheelchair Mobility    Modified Rankin (Stroke Patients Only)       Balance Overall balance assessment: No apparent balance deficits (not formally assessed)                                           Pertinent Vitals/Pain Pain Assessment: 0-10 Pain Score: 8  Pain Location: R shoulder Pain Descriptors / Indicators: Aching;Sore Pain Intervention(s): Premedicated before session;Monitored during session    [place] expects to be discharged to:: Private residence Living Arrangements: Spouse/significant other Available Help at Discharge: Family;Personal care attendant;Available 24 hours/day Type of Home: Mobile home Home Access: Ramped entrance     Home Layout: One level Home Equipment: [place] - 4 wheels;Cane - quad Additional Comments: PCA M-F 10-2 pm    Prior Function Level of Independence: Needs assistance   Gait / Transfers Assistance Needed: Mod Ind with amb with a rollator limited community distances, no fall history  ADL's / Homemaking  Assistance Needed: Assistance from [place] for bathing and dressing as well as meals and housework        Hand Dominance   Dominant Hand: Right    Extremity/Trunk Assessment   Upper Extremity Assessment Upper Extremity Assessment: Defer to OT evaluation;RUE deficits/detail RUE: Unable to fully assess due to immobilization;Unable to fully assess due to pain    Lower Extremity Assessment Lower  Extremity Assessment: Generalized weakness       Communication   Communication: No difficulties  Cognition Arousal/Alertness: Awake/alert Behavior During Therapy: WFL for tasks assessed/performed Overall Cognitive Status: Within Functional Limits for tasks assessed                                        General Comments      Exercises Total Joint Exercises Ankle Circles/Pumps: Strengthening;Both;5 reps;10 reps Quad Sets: Strengthening;Both;5 reps;10 reps Gluteal Sets: Strengthening;Both;5 reps;10 reps Hip ABduction/ADduction: AROM;Both;5 reps Long Arc Quad: Strengthening;Both;5 reps;10 reps Knee Flexion: Strengthening;Both;5 reps;10 reps Marching in Standing: AROM;Both;10 reps Other Exercises Other Exercises: HEP education/review for R hand and wrist AROM, BLE APs, QS, GS, and LAQs   Assessment/Plan    PT Assessment Patient needs continued PT services  PT Problem List Decreased strength;Decreased range of motion;Decreased activity tolerance;Decreased mobility;Decreased knowledge of use of DME       PT Treatment Interventions DME instruction;Gait training;Functional mobility training;Therapeutic activities;Therapeutic exercise;Balance training;Patient/family education    PT Goals (Current goals can be found in the Care Plan section)  Acute Rehab PT Goals Patient Stated Goal: To get stronger and return home PT Goal Formulation: With patient Time For Goal Achievement: 11/16/18 Potential to Achieve Goals: Good    Frequency BID   Barriers to discharge        Co-evaluation               AM-PAC PT "6 Clicks" Mobility  Outcome Measure Help needed turning from your back to your side while in a flat bed without using bedrails?: A Lot Help needed moving from lying on your back to sitting on the side of a flat bed without using bedrails?: A Lot Help needed moving to and from a bed to a chair (including a wheelchair)?: A Little Help needed standing up  from a chair using your arms (e.g., wheelchair or bedside chair)?: A Little Help needed to walk in hospital room?: A Little Help needed climbing 3-5 steps with a railing? : A Little 6 Click Score: 16    End of Session Equipment Utilized During Treatment: Gait belt Activity Tolerance: Patient tolerated treatment well Patient left: in chair;with chair alarm set;with call bell/phone within reach Nurse Communication: Mobility status;Other (comment)(Drain briefly disconnected from collection container and reconnected, nursing notified) PT Visit Diagnosis: Muscle weakness (generalized) (M62.81);Difficulty in walking, not elsewhere classified (R26.2)    Time: 5465-0354 PT Time Calculation (min) (ACUTE ONLY): 41 min   Charges:   PT Evaluation $PT Eval Low Complexity: 1 Low PT Treatments $Therapeutic Exercise: 8-22 mins        D. Royetta Asal PT, DPT 11/03/18, 5:09 PM

## 2018-11-03 NOTE — Op Note (Signed)
Operative Note    SURGERY DATE: 11/03/2018   PRE-OP DIAGNOSIS:  1. Right shoulder rotator cuff arthropathy 2.  Right proximal biceps tendinopathy   POST-OP DIAGNOSIS:  1. Right shoulder rotator cuff arthropathy 2.  Right proximal biceps tendinopathy  PROCEDURES:  1. Right reverse total shoulder arthroplasty 2. Right biceps tenodesis   SURGEON: Cato Mulligan, MD  ASSISTANTS: Reche Dixon, PA   ANESTHESIA: Gen + interscalene block   ESTIMATED BLOOD LOSS: 300cc   TOTAL IV FLUIDS: See anesthesia record  IMPLANTS: [employer] Surgical: RSP Glenoid Head w/Retaining screw 32-4; Monoblock Reverse Shoulder Baseplate with 4.0JW central screw; 3 locking screws into baseplate (11BJ, 47WG, 95AO); Small Shell Humeral Stem 12 x71m; Small Shell Socket Insert +4.   INDICATION(S):  PSorrel Cassettais a 68y.o. female with chronic shoulder pain with inability to lift arm overhead. Imaging consistent with massive, irreparable rotator cuff tear. Conservative measures including medications and cortisone injections have not provided adequate relief. After discussion of risks, benefits, and alternatives to surgery, the patient elected to proceed with reverse shoulder arthroplasty and biceps tenodesis.   OPERATIVE FINDINGS: massive rotator cuff tear (complete supraspinatus, partial infraspinatus, complete subscapularis)   OPERATIVE REPORT:   I identified [name] in the pre-operative holding area. Informed consent was obtained and the surgical site was marked. I reviewed the risks and benefits of the proposed surgical intervention and the patient (and/or patient's guardian) wished to proceed. An interscalene block with Exparil was administered by the Anesthesia team. The patient was transferred to the operative suite and general anesthesia was administered. The patient was placed in the beach chair position with the head of the bed elevated approximately 45 degrees. All down side pressure points were  appropriately padded. Pre-op exam under anesthesia confirmed some stiffness and crepitus. Appropriate IV antibiotics were administered. Tranexamic acid was also administered after verifying that the patient had no contraindications. The extremity was then prepped and draped in standard fashion. A time out was performed confirming the correct extremity, correct patient, and correct procedure.   We used the standard deltopectoral incision from the coracoid to ~12cm distal. We found the cephalic vein and took it laterally.  However, it was nicked during exposure and it was therefore coagulated.  We opened the deltopectoral interval widely and placed retractors under the CA ligament in the subacromial space and under the deltoid tendon at its insertion. We then abducted and internally rotated the arm and released the underlying bursa between these retractors, taking care not to damage the circumflex branch of the axillary nerve.   Next, we brought the arm back in adduction at slight forward flexion with external rotation. We opened the clavipectoral fascia lateral to the conjoint tendon. We gently palpated the axillary nerve and verified its position and continuity on both sides of the humerus with a Tug test. Note, this test was repeated multiple times during the procedure for nerve localization and confirmed to be intact at the end of the case. We then cauterized the anterior humeral circumflex ("Three sisters") vessels. The arm was then internally rotated, we cut the falciform ligament at approximately 1 cm of the upper portion of the pectoralis major insertion. Next we unroofed the bicipital groove.  There was significant tendinopathy and fluid surrounding the biceps tendon.  We proceeded with a soft tissue biceps tenodesis given the pathology of the tendon.  After opening the biceps tendon sheath all the way to the supraglenoid tubercle, we performed a biceps tenodesis with two #2 TiCron  sutures to the upper  border of the pectoralis major. The proximal portion of the tendon was excised.   At this point, we could see that the supraspinatus was completely torn with a bald humeral head superiorly. The subscapularis also had a complete tear . We performed a subscapularis peel using electrocautery to remove the anterior capsule and subscapularis off of the humeral head. We released the inferior capsule from the humerus all the way to the posterior band of the inferior glenohumeral ligament. When this was complete we gently dislocated the shoulder up into the wound. We removed any osteophytes and made our cut with the appropriate inclination in 30 degrees of retroversion  We then turned our attention back to the glenoid. The proximal humerus was retracted posteriorly. The anterior capsule was dissected free from the subscapularis. The anterior capsule was then excised, exposing the anterior glenoid. We then grasped the labrum and removed it circumferentially. During the glenoid exposure, the axillary nerve was protected the entire time.    A patient-specific guide was used to drill the central guidepin. A tap was placed, matching the trajectory of the guidepin. An appropriately sized reamer was used to ream the glenoid. The monoblock baseplate was inserted and excellent fixation was achieved such that the entire scapula rotated with further attempted seating of the baseplate. Three peripheral screws were drilled, measured, and placed. Anterior screw was not placed due to insufficient bone stock.  A 32-4 glenosphere was then placed and tightened.   We then turned our attention back to the humerus. We sized for a small-shell prosthesis. We sequentially used larger diameter canal finders until we met significant resistance and sequential broaching was performed to the size listed above. We trialed both a 0 and +4 poly. The humerus was trialed and noted to have satisfactory stability, motion, and deltoid tension with a +4  poly. We drilled 3 holes and passed 3 #2 Fiberwire sutures through for our subscapularis repair. The final humeral component was then inserted. We placed a +4 poly. The humerus was reduced and final confirmation of motion, tension, and stability were satisfactory. A Hemovac drain was placed. Subscapularis was repaired with the previously passed #2 FiberWire sutures after passing them through the subscapularis.   We again verified the tension on the axillary nerve, appropriate range of motion, stability of the implant, and security of the subscapularis repair. We closed the deltopectoral interval with a running, 0-Vicryl suture. The skin was closed with 2-0 Vicryl and staples. Xeroform and Honeycomb dressing was applied. A PolarCare unit and sling were placed. Patient was extubated, transferred to a stretcher bed and to the post antesthesia care unit in stable condition.   Of note, services of a PA were essential to performing the surgery. PA was able to assist in patient positioning, exposure, retraction, and suturing the wound.    POSTOPERATIVE PLAN: The patient will be admitted with plan for discharge home on POD#1. Operative arm to remain in sling at all times except RoM exercises and hygiene. Can perform pendulums, elbow/wrist/hand RoM exercises. Passive RoM allowed to 90 FF and 30 ER. ASA '325mg'$  x 6 weeks for DVT ppx. Plan for outpatient PT starting on POD #3-4. Patient to return to clinic in ~2 weeks for post-operative appointment.

## 2018-11-04 ENCOUNTER — Encounter: Payer: Self-pay | Admitting: Orthopedic Surgery

## 2018-11-04 LAB — CBC
HCT: 39 % (ref 36.0–46.0)
Hemoglobin: 11.8 g/dL — ABNORMAL LOW (ref 12.0–15.0)
MCH: 29.1 pg (ref 26.0–34.0)
MCHC: 30.3 g/dL (ref 30.0–36.0)
MCV: 96.3 fL (ref 80.0–100.0)
Platelets: 183 10*3/uL (ref 150–400)
RBC: 4.05 MIL/uL (ref 3.87–5.11)
RDW: 14.1 % (ref 11.5–15.5)
WBC: 12.2 10*3/uL — ABNORMAL HIGH (ref 4.0–10.5)
nRBC: 0 % (ref 0.0–0.2)

## 2018-11-04 LAB — BASIC METABOLIC PANEL
Anion gap: 7 (ref 5–15)
BUN: 30 mg/dL — ABNORMAL HIGH (ref 8–23)
CO2: 21 mmol/L — ABNORMAL LOW (ref 22–32)
Calcium: 9.2 mg/dL (ref 8.9–10.3)
Chloride: 112 mmol/L — ABNORMAL HIGH (ref 98–111)
Creatinine, Ser: 2.54 mg/dL — ABNORMAL HIGH (ref 0.44–1.00)
GFR calc Af Amer: 22 mL/min — ABNORMAL LOW (ref >60)
GFR calc non Af Amer: 19 mL/min — ABNORMAL LOW (ref >60)
Glucose, Bld: 203 mg/dL — ABNORMAL HIGH (ref 70–99)
Potassium: 5 mmol/L (ref 3.5–5.1)
Sodium: 140 mmol/L (ref 135–145)

## 2018-11-04 LAB — GLUCOSE, CAPILLARY
Glucose-Capillary: 177 mg/dL — ABNORMAL HIGH (ref 70–99)
Glucose-Capillary: 221 mg/dL — ABNORMAL HIGH (ref 70–99)

## 2018-11-04 MED ORDER — ENOXAPARIN SODIUM 40 MG/0.4ML ~~LOC~~ SOLN: 40.0000 mg | SUBCUTANEOUS | 0 refills | Status: DC

## 2018-11-04 MED ORDER — OXYCODONE HCL 5 MG PO TABS: 5.0000 mg | ORAL_TABLET | ORAL | 0 refills | Status: DC | PRN

## 2018-11-04 MED ORDER — ASPIRIN 325 MG PO TBEC: 325.0000 mg | DELAYED_RELEASE_TABLET | Freq: Every day | ORAL | 0 refills | Status: DC

## 2018-11-04 MED ORDER — ONDANSETRON HCL 4 MG PO TABS: 4.0000 mg | ORAL_TABLET | Freq: Four times a day (QID) | ORAL | 0 refills | Status: AC | PRN

## 2018-11-04 NOTE — Progress Notes (Signed)
Physical Therapy Treatment Patient Details Name: Katrina Hughes MRN: [id number] DOB: [birth year] Today's Date: 11/04/2018    History of Present Illness Pt is a 68 yo F with a PMH that includes CHF, CKD, DM, GERD, MI, HTN, and L TSA, who is now s/p elective R reverse TSA and R biceps tenodesis.    PT Comments    Pt presents with deficits in strength, transfers, mobility, gait, and activity tolerance.  Pt was CGA with transfers with good eccentric and concentric control from various height surfaces.  Pt was able to amb 1 x 20' and 1 x 10' with a SPC with min verbal cues for sequencing without LOB but with slow, cautious gait.  Pt was limited by pain during R shoulder PROM and tolerated grossly 20 deg of FF PROM and 10 deg of ER PROM.  Pt will benefit from HHPT services upon discharge to safely address above deficits for decreased caregiver assistance and eventual return to PLOF.     Follow Up Recommendations  Home health PT;Follow surgeon's recommendation for DC plan and follow-up therapies     Equipment Recommendations  None recommended by PT    Recommendations for Other Services       Precautions / Restrictions Precautions Precautions: Fall;Shoulder Type of Shoulder Precautions: no AROM of shoulder or elbow (PROM ok, shoulder FF 0-90, ER 0-30), RUE NWBing, hand/wrist AROM ok, pendulums ok Shoulder Interventions: Shoulder sling/immobilizer Precaution Booklet Issued: Yes (comment) Required Braces or Orthoses: Sling Restrictions Weight Bearing Restrictions: Yes RUE Weight Bearing: Non weight bearing Other Position/Activity Restrictions: OK for RUE pendulums, R shoulder PROM to 90 deg FF and 30 deg ER.  OK for hand/wrist ROM, NO elbow AROM secondary to tenodesis per MD    Mobility  Bed Mobility               General bed mobility comments: deferred, pt received in recliner  Transfers Overall transfer level: Needs assistance Equipment used: Straight cane Transfers: Sit  to/from Stand Sit to Stand: Min guard         General transfer comment: Good eccentric and concentric control with transfers  Ambulation/Gait Ambulation/Gait assistance: Min guard Gait Distance (Feet): 40 Feet x 1, 20 Feet x 1 Assistive device: Straight cane Gait Pattern/deviations: Step-through pattern;Decreased step length - right;Decreased step length - left Gait velocity: decreased   General Gait Details: Min verbal cues with amb with a SPC with short B step length and slow cadence but no LOB   Stairs             Wheelchair Mobility    Modified Rankin (Stroke Patients Only)       Balance Overall balance assessment: No apparent balance deficits (not formally assessed)                                          Cognition Arousal/Alertness: Awake/alert Behavior During Therapy: WFL for tasks assessed/performed Overall Cognitive Status: Within Functional Limits for tasks assessed                                        Exercises Total Joint Exercises Ankle Circles/Pumps: Strengthening;Both;5 reps;10 reps Quad Sets: Strengthening;Both;5 reps;10 reps Gluteal Sets: Strengthening;Both;5 reps;10 reps Hip ABduction/ADduction: AROM;Both;5 reps Long Arc Quad: Strengthening;Both;5 reps;10 reps Knee Flexion: Strengthening;Both;5 reps;10 Theatre manager  in Standing: AROM;Both;10 reps;Standing Other Exercises Other Exercises: R shoulder FF PROM 0-20 deg grossly and ER PROM to 10 deg, limited by pain Other Exercises: Pt instructed in home/ADL routines modifications, shoulder precautions, AE/DME, shoulder sling/immobilizer mgt, polar care mgt, and compression stockings mgt; handout provided to support recall and carryover    General Comments General comments (skin integrity, edema, etc.): polar care and sling adjusted to optimize positioning, comfort, and joint/skin protection      Pertinent Vitals/Pain Pain Assessment: 0-10 Pain Score: 5   Pain Location: R shoulder Pain Descriptors / Indicators: Aching;Sore Pain Intervention(s): Premedicated before session;Monitored during session;Limited activity within patient's tolerance    Home Living Family/patient expects to be discharged to:: Private residence Living Arrangements: Spouse/significant other(wife, [name]) Available Help at Discharge: Family;Personal care attendant;Available 24 hours/day Type of Home: Mobile home Home Access: Ramped entrance   Home Layout: One level Home Equipment: [place] - 4 wheels;Cane - quad;Shower seat - built in;Bedside commode(BSC over toilet) Additional Comments: PCA M-F 10-2 pm    Prior Function Level of Independence: Needs assistance  Gait / Transfers Assistance Needed: Mod Ind with amb with a rollator limited community distances, endorses 1 fall in bathroom ADL's / Homemaking Assistance Needed: Assistance from [place] for bathing and dressing as well as meals and housework     PT Goals (current goals can now be found in the care plan section) Acute Rehab PT Goals Patient Stated Goal: To get stronger and return home Progress towards PT goals: Progressing toward goals    Frequency    BID      PT Plan Discharge plan needs to be updated    Co-evaluation              AM-PAC PT "6 Clicks" Mobility   Outcome Measure  Help needed turning from your back to your side while in a flat bed without using bedrails?: A Lot Help needed moving from lying on your back to sitting on the side of a flat bed without using bedrails?: A Lot Help needed moving to and from a bed to a chair (including a wheelchair)?: A Little Help needed standing up from a chair using your arms (e.g., wheelchair or bedside chair)?: A Little Help needed to walk in hospital room?: A Little Help needed climbing 3-5 steps with a railing? : A Little 6 Click Score: 16    End of Session Equipment Utilized During Treatment: Gait belt Activity Tolerance: Patient tolerated  treatment well Patient left: in chair;with chair alarm set;with call bell/phone within reach;with SCD's reapplied Nurse Communication: Mobility status PT Visit Diagnosis: Muscle weakness (generalized) (M62.81);Difficulty in walking, not elsewhere classified (R26.2)     Time: 5027-7412 PT Time Calculation (min) (ACUTE ONLY): 39 min  Charges:  $Gait Training: 8-22 mins $Therapeutic Exercise: 23-37 mins                     D. Scott Jeanmarc Viernes PT, DPT 11/04/18, 10:34 AM

## 2018-11-04 NOTE — Evaluation (Signed)
Occupational Therapy Evaluation Patient Details Name: Katrina Hughes MRN: [id number] DOB: 16-Oct-1950 Today's Date: 11/04/2018    History of Present Illness Pt is a 68 yo F with a PMH that includes CHF, CKD, DM, GERD, MI, HTN, and L TSA, who is now s/p elective R reverse TSA and R biceps tenodesis.   Clinical Impression   Patient was seen for an OT evaluation this date. Pt lives with her spouse in a mobile home with ramped entrance, walk in shower with built in bench, and has a BSC overtop her standard height toilet. Prior to surgery, pt was generally modified independent with functional mobility using rollator and received assist from PCA and spouse for bathing and dressing, meal prep, and cleaning. Pt has orders for RUE to be immobilized and will be NWBing per MD. Patient presents with impaired strength/ROM, and pain to RUE. Pt denies sensory deficits. These impairments result in a decreased ability to perform self care tasks requiring mod assist for UB/LB dressing and bathing and max assist for application of polar care, compression stockings, and sling/immobilizer. Pt instructed in polar care mgt, compression stockings mgt, sling/immobilizer mgt, ROM exercises for RUE (with instructions for no active ROM of elbow 2/2 bicep tenodesis), RUE precautions, adaptive strategies for bathing/dressing/toileting/grooming, positioning and considerations for sleep, and home/routines modifications to maximize falls prevention, safety, and independence. Handout provided. OT adjusted sling/immobilizer and polar care to improve comfort, optimize positioning, and to maximize skin integrity/safety. Pt verbalized understanding of all education/training provided. Pt will benefit from skilled OT services to address these limitations and improve independence in daily tasks. Recommend follow up OT services as recommended by surgeon to continue therapy to maximize return to PLOF, address home/routines modifications and  safety, minimize falls risk, and minimize caregiver burden.      Follow Up Recommendations  Follow surgeon's recommendation for DC plan and follow-up therapies(consider OP OT)    Equipment Recommendations  None recommended by OT    Recommendations for Other Services       Precautions / Restrictions Precautions Precautions: Fall;Shoulder Type of Shoulder Precautions: no AROM of shoulder or elbow (PROM ok, shoulder FF 0-90, ER 0-30), RUE NWBing, hand/wrist AROM ok, pendulums ok Shoulder Interventions: Shoulder sling/immobilizer Precaution Booklet Issued: Yes (comment) Required Braces or Orthoses: Sling Restrictions Weight Bearing Restrictions: Yes RUE Weight Bearing: Non weight bearing Other Position/Activity Restrictions: OK for RUE pendulums, R shoulder PROM to 90 deg FF and 30 deg ER.  OK for hand/wrist ROM, NO elbow AROM secondary to tenodesis per MD      Mobility Bed Mobility               General bed mobility comments: deferred, pt received in recliner  Transfers Overall transfer level: Needs assistance Equipment used: 1 person hand held assist Transfers: Sit to/from Stand Sit to Stand: Min guard              Balance Overall balance assessment: No apparent balance deficits (not formally assessed)                                         ADL either performed or assessed with clinical judgement   ADL Overall ADL's : Needs assistance/impaired  General ADL Comments: Min-Mod A for LB and UB ADL 2/2 impaired functional use of RUE; pt reports spouse and PCA will be able to provide needed level of assist     Vision Baseline Vision/History: Wears glasses Wears Glasses: Reading only Patient Visual Report: No change from baseline Vision Assessment?: No apparent visual deficits     Perception     Praxis      Pertinent Vitals/Pain Pain Assessment: 0-10 Pain Score: 5  Pain Location: R  shoulder Pain Descriptors / Indicators: Aching;Sore Pain Intervention(s): Limited activity within patient's tolerance;Monitored during session;Premedicated before session;Repositioned;Ice applied     Hand Dominance Right   Extremity/Trunk Assessment Upper Extremity Assessment Upper Extremity Assessment: RUE deficits/detail RUE Deficits / Details: good grip, intact sensation, limited further assessment 2/2 immobilization and pain RUE: Unable to fully assess due to pain;Unable to fully assess due to immobilization RUE Sensation: WNL RUE Coordination: decreased gross motor;decreased fine motor   Lower Extremity Assessment Lower Extremity Assessment: Generalized weakness   Cervical / Trunk Assessment Cervical / Trunk Assessment: Normal   Communication Communication Communication: No difficulties   Cognition Arousal/Alertness: Awake/alert Behavior During Therapy: WFL for tasks assessed/performed Overall Cognitive Status: Within Functional Limits for tasks assessed                                     General Comments  polar care and sling adjusted to optimize positioning, comfort, and joint/skin protection    Exercises Other Exercises Other Exercises: Pt instructed in home/ADL routines modifications, shoulder precautions, AE/DME, shoulder sling/immobilizer mgt, polar care mgt, and compression stockings mgt; handout provided to support recall and carryover   Shoulder Instructions      Home Living Family/patient expects to be discharged to:: Private residence Living Arrangements: Spouse/significant other(wife, Katrina Hughes) Available Help at Discharge: Family;Personal care attendant;Available 24 hours/day Type of Home: Mobile home Home Access: Ramped entrance     Home Layout: One level     Bathroom Shower/Tub: Occupational psychologist: Standard     Home Equipment: Environmental consultant - 4 wheels;Cane - quad;Shower seat - built in;Bedside commode(BSC over toilet)    Additional Comments: PCA M-F 10-2 pm      Prior Functioning/Environment Level of Independence: Needs assistance  Gait / Transfers Assistance Needed: Mod Ind with amb with a rollator limited community distances, endorses 1 fall in bathroom ADL's / Homemaking Assistance Needed: Assistance from Katrina Hughes for bathing and dressing as well as meals and housework            OT Problem List: Decreased strength;Decreased range of motion;Pain;Decreased knowledge of use of DME or AE;Impaired UE functional use;Decreased knowledge of precautions      OT Treatment/Interventions: Self-care/ADL training;Therapeutic exercise;Therapeutic activities;DME and/or AE instruction;Patient/family education;Balance training    OT Goals(Current goals can be found in the care plan section) Acute Rehab OT Goals Patient Stated Goal: To get stronger and return home OT Goal Formulation: With patient Time For Goal Achievement: 11/18/18 Potential to Achieve Goals: Good ADL Goals Pt Will Perform Upper Body Dressing: with caregiver independent in assisting;sitting;with min assist Additional ADL Goal #1: Pt will independently instruct family/PCA in compression stocking mgt Additional ADL Goal #2: Pt will independently instruct family/PCA in polar care mgt Additional ADL Goal #3: Pt will independently instruct family/PCA in shoulder sling/immobilizer mgt  OT Frequency: Min 1X/week   Barriers to D/C:  Co-evaluation              AM-PAC OT "6 Clicks" Daily Activity     Outcome Measure Help from another person eating meals?: None Help from another person taking care of personal grooming?: None Help from another person toileting, which includes using toliet, bedpan, or urinal?: A Little Help from another person bathing (including washing, rinsing, drying)?: A Lot Help from another person to put on and taking off regular upper body clothing?: A Lot Help from another person to put on and taking off regular  lower body clothing?: A Lot 6 Click Score: 17   End of Session    Activity Tolerance: Patient tolerated treatment well Patient left: in chair;with call bell/phone within reach;with chair alarm set;with SCD's reapplied;Other (comment)(polar care and shoulder sling/immobilizer in place)  OT Visit Diagnosis: Other abnormalities of gait and mobility (R26.89);Muscle weakness (generalized) (M62.81);Pain Pain - Right/Left: Right Pain - part of body: Shoulder                Time: 3790-2409 OT Time Calculation (min): 28 min Charges:  OT General Charges $OT Visit: 1 Visit OT Evaluation $OT Eval Low Complexity: 1 Low OT Treatments $Self Care/Home Management : 8-22 mins $Therapeutic Activity: 8-22 mins  Jeni Salles, MPH, MS, OTR/L ascom [phone] 11/04/18, 9:11 AM

## 2018-11-04 NOTE — Discharge Summary (Addendum)
Physician Discharge Summary  Subjective: 1 Day Post-Op Procedure(s) (LRB): REVERSE SHOULDER ARTHROPLASTY, BICEPS TENODESIS (Right) Patient reports pain as moderate.   Patient seen in rounds with Dr. Posey Pronto. Patient is well, and has had no acute complaints or problems Patient is ready to go home with home health physical therapy  Physician Discharge Summary  Patient ID: Katrina Hughes MRN: [id number] DOB/AGE: 1950-10-23 68 y.o.  Admit date: 11/03/2018 Discharge date: 11/04/2018  Admission Diagnoses:  Discharge Diagnoses:  Active Problems:   S/p reverse total shoulder arthroplasty   Discharged Condition: fair  Hospital Course: The patient is postop day 1 from a right reverse shoulder replacement.  She has done well with her pain management.  The patient has gotten up with physical therapy.  The patient had her drain removed this morning.  She is ready to go home today.  She will be set up for [facility] PT at home.  Treatments: surgery:  1. Right reverse total shoulder arthroplasty 2. Right biceps tenodesis  SURGEON: Cato Mulligan, MD  ASSISTANTS: Reche Dixon, PA  ANESTHESIA: Gen + interscalene block  ESTIMATED BLOOD LOSS: 300cc  TOTAL IV FLUIDS: See anesthesia record  IMPLANTS: DJO Surgical: RSP Glenoid Head w/Retaining screw 32-4; Monoblock Reverse Shoulder Baseplate with 6.0VP central screw; 3 locking screws into baseplate (71GG, 26RS, 85IO); Small Shell Humeral Stem 12 x18mm; Small Shell Socket Insert +4.   Discharge Exam: Blood pressure (!) 164/81, pulse 77, temperature 98.3 F (36.8 C), temperature source Oral, resp. rate 17, height 5\' 5"  (1.651 m), weight 112 kg, SpO2 96 %.   Disposition: Discharge disposition: 06-Home-Health Care Svc        Allergies as of 11/04/2018      Reactions   Sulfur Other (See Comments)   Sulfur based products Skin and stomach irritation   Aspirin Other (See Comments)   Ulcers in stomach      Medication List    TAKE  these medications   acetaminophen 325 MG tablet Commonly known as: TYLENOL Take 650-1,300 mg by mouth See admin instructions. Take 1300 mg by mouth twice daily. Take 650 mg by mouth twice daily as needed for pain or headache.   albuterol 108 (90 Base) MCG/ACT inhaler Commonly known as: VENTOLIN HFA Inhale 2 puffs into the lungs every 6 (six) hours as needed for wheezing or shortness of breath.   allopurinol 100 MG tablet Commonly known as: ZYLOPRIM Take 100 mg by mouth 2 (two) times daily.   atorvastatin 80 MG tablet Commonly known as: LIPITOR Take 40 mg by mouth at bedtime.   brimonidine 0.2 % ophthalmic solution Commonly known as: ALPHAGAN Place 1 drop into both eyes 2 (two) times a day.   budesonide-formoterol 160-4.5 MCG/ACT inhaler Commonly known as: SYMBICORT Inhale 2 puffs into the lungs 2 (two) times a day.   carvedilol 12.5 MG tablet Commonly known as: COREG Take 12.5 mg by mouth 2 (two) times daily with a meal.   diclofenac sodium 1 % Gel Commonly known as: VOLTAREN Apply 1 application topically 2 (two) times a day.   Difluprednate 0.05 % Emul Place 1 drop into the left eye 4 (four) times daily.   enoxaparin 40 MG/0.4ML injection Commonly known as: LOVENOX Inject 0.4 mLs (40 mg total) into the skin daily for 14 doses.   furosemide 40 MG tablet Commonly known as: LASIX Take 40 mg by mouth 2 (two) times daily.   insulin aspart 100 UNIT/ML injection Commonly known as: novoLOG Inject 3-10 Units into the skin 3 (  three) times daily with meals.   insulin glargine 100 UNIT/ML injection Commonly known as: LANTUS Inject 38 Units into the skin at bedtime.   lactase 3000 units tablet Commonly known as: LACTAID Take 3,000 Units by mouth 3 (three) times daily with meals.   lactobacillus acidophilus & bulgar chewable tablet Chew 1 tablet by mouth 2 (two) times daily.   latanoprost 0.005 % ophthalmic solution Commonly known as: XALATAN Place 1 drop into both  eyes at bedtime.   methimazole 5 MG tablet Commonly known as: TAPAZOLE Take 5 mg by mouth daily.   nitroGLYCERIN 0.6 mg/hr patch Commonly known as: NITRODUR - Dosed in mg/24 hr Place 0.6 mg onto the skin daily. Places at [place], removes at 10p   ondansetron 4 MG tablet Commonly known as: ZOFRAN Take 1 tablet (4 mg total) by mouth every 6 (six) hours as needed for nausea.   oxyCODONE 5 MG immediate release tablet Commonly known as: Oxy IR/ROXICODONE Take 1-2 tablets (5-10 mg total) by mouth every 4 (four) hours as needed for moderate pain (pain score 4-6).   pantoprazole 20 MG tablet Commonly known as: PROTONIX Take 20 mg by mouth 2 (two) times daily.   senna-docusate 8.6-50 MG tablet Commonly known as: Senokot-S Take 1 tablet by mouth 2 (two) times daily.   sodium chloride 0.65 % Soln nasal spray Commonly known as: OCEAN Place 2 sprays into both nostrils daily as needed for congestion.   spironolactone 25 MG tablet Commonly known as: ALDACTONE Take 25 mg by mouth daily.   timolol 0.5 % ophthalmic solution Commonly known as: BETIMOL Place 1 drop into both eyes daily.   topiramate 100 MG tablet Commonly known as: TOPAMAX Take 100 mg by mouth every evening.      Follow-up Information    Leim Fabry, MD. Go on 11/19/2018.   Specialty: Orthopedic Surgery Why: Staple removal @2 :15pm  Contact information: [place] [address] [phone]           Signed: Prescott Parma, Koltin Wehmeyer 11/04/2018, 11:05 AM   Objective: Vital signs in last 24 hours: Temp:  [97.2 F (36.2 C)-100 F (37.8 C)] 98.3 F (36.8 C) (07/28 0808) Pulse Rate:  [55-93] 77 (07/28 0808) Resp:  [12-20] 17 (07/28 0808) BP: (135-172)/(74-99) 164/81 (07/28 0808) SpO2:  [89 %-98 %] 96 % (07/28 0808)  Intake/Output from previous day:  Intake/Output Summary (Last 24 hours) at 11/04/2018 1105 Last data filed at 11/04/2018 0900 Gross per 24 hour  Intake 3173.07 ml  Output 2035 ml  Net  1138.07 ml    Intake/Output this shift: Total I/O In: 240 [P.O.:240] Out: -   Labs: Recent Labs    11/04/18 0355  HGB 11.8*   Recent Labs    11/04/18 0355  WBC 12.2*  RBC 4.05  HCT 39.0  PLT 183   Recent Labs    11/04/18 0355  NA 140  K 5.0  CL 112*  CO2 21*  BUN 30*  CREATININE 2.54*  GLUCOSE 203*  CALCIUM 9.2   No results for input(s): LABPT, INR in the last 72 hours.  EXAM: General - Patient is Alert and Oriented Extremity - Sensation intact distally Intact pulses distally Dorsiflexion/Plantar flexion intact Incision - clean, dry, with a Hemovac removed with no complication. Motor Function -dorsiflexion and volar flexion of the wrist with normal grip strength.  Able to give posterior pressure with the elbow.  Assessment/Plan: 1 Day Post-Op Procedure(s) (LRB): REVERSE SHOULDER ARTHROPLASTY, BICEPS TENODESIS (Right) Procedure(s) (LRB): REVERSE  SHOULDER ARTHROPLASTY, BICEPS TENODESIS (Right) Past Medical History:  Diagnosis Date  . Anginal pain ([place])   . Bell palsy   . CHF (congestive heart failure) ([place])   . Chronic kidney disease   . Complication of anesthesia    slow to wake up  . Diabetes mellitus without complication ([place])   . Dyspnea   . Environmental and seasonal allergies   . GERD (gastroesophageal reflux disease)   . Glaucoma   . Hypertension   . Hyperthyroidism   . Myocardial infarction ([place])   . Sleep apnea    Active Problems:   S/p reverse total shoulder arthroplasty  Estimated body mass index is 41.09 kg/m as calculated from the following:   Height as of this encounter: 5\' 5"  (1.651 m).   Weight as of this encounter: 112 kg. Advance diet Up with therapy D/C IV fluids  Discharge home today after physical therapy Diet - Diabetic diet Follow up - in 2 weeks Activity -wear the shoulder immobilizer at all times except for passive motion. Disposition - Home Condition Upon Discharge - Stable DVT Prophylaxis - Aspirin  Reche Dixon, PA-C Orthopaedic Surgery 11/04/2018, 11:05 AM

## 2018-11-04 NOTE — TOC Transition Note (Signed)
Transition of Care [facility]) - CM/SW Discharge Note   Patient Details  Name: Katrina Hughes MRN: [id number] Date of Birth: [birth year]  Transition of Care [facility]) CM/SW Contact:  Su Hilt, RN Phone Number: 11/04/2018, 9:18 AM   Clinical Narrative:    Met with the patient to discuss DC plan and needs She lives at home with her spouse and has a Rollator at home, no DME needed, She would like to use [facility] for [facility] PT, she does have an aide that comes to the house for a few hours each day, that company does not have PT, [place] with [facility] stated that they do accept the [employer]. NO further needs   Final next level of care: Home w Home Health Services Barriers to Discharge: Barriers Resolved   Patient Goals and CMS Choice Patient states their goals for this hospitalization and ongoing recovery are:: go home CMS Medicare.gov Compare Post Acute Care list provided to:: Patient Choice offered to / list presented to : Patient  Discharge Placement                       Discharge Plan and Services   Discharge Planning Services: CM Consult Post Acute Care Choice: Home Health          DME Arranged: N/A         HH Arranged: PT HH Agency: [facility] Date [place]: 11/04/18 Time [place]: [phone] Representative spoke with at [place]: [place] ([place]) Interventions     Readmission Risk Interventions No flowsheet data found.

## 2018-11-04 NOTE — Progress Notes (Signed)
[place] at [place] NAME: Katrina Hughes    MR#:  [id number]  DATE OF BIRTH:  [birth year]  SUBJECTIVE:   Pt. Admitted to the hospital for right shoulder arthroplasty.  POD # 1. No complaints or acute events overnight.   REVIEW OF SYSTEMS:    Review of Systems  Constitutional: Negative for chills and fever.  HENT: Negative for congestion and tinnitus.   Eyes: Negative for blurred vision and double vision.  Respiratory: Negative for cough, shortness of breath and wheezing.   Cardiovascular: Negative for chest pain, orthopnea and PND.  Gastrointestinal: Negative for abdominal pain, diarrhea, nausea and vomiting.  Genitourinary: Negative for dysuria and hematuria.  Musculoskeletal: Positive for joint pain (Right shoulder pain).  Neurological: Negative for dizziness, sensory change and focal weakness.  All other systems reviewed and are negative.   Nutrition: Carb control Tolerating Diet: Yes Tolerating PT: Await Eval .  DRUG ALLERGIES:   Allergies  Allergen Reactions  . Sulfur Other (See Comments)    Sulfur based products Skin and stomach irritation  . Aspirin Other (See Comments)    Ulcers in stomach    VITALS:  Blood pressure (!) 164/81, pulse 77, temperature 98.3 F (36.8 C), temperature source Oral, resp. rate 17, height 5\' 5"  (1.651 m), weight 112 kg, SpO2 96 %.  PHYSICAL EXAMINATION:   Physical Exam  GENERAL:  68 y.o.-year-old obese patient sitting up in chair in no acute distress.  EYES: Pupils equal, round, reactive to light and accommodation. No scleral icterus. Extraocular muscles intact.  HEENT: Head atraumatic, normocephalic. Oropharynx and nasopharynx clear.  NECK:  Supple, no jugular venous distention. No thyroid enlargement, no tenderness.  LUNGS: Normal breath sounds bilaterally, no wheezing, rales, rhonchi. No use of accessory muscles of respiration.  CARDIOVASCULAR: S1, S2 normal. No murmurs, rubs, or  gallops.  ABDOMEN: Soft, nontender, nondistended. Bowel sounds present. No organomegaly or mass.  EXTREMITIES: No cyanosis, clubbing or edema b/l.    NEUROLOGIC: Cranial nerves II through XII are intact. No focal Motor or sensory deficits b/l.   PSYCHIATRIC: The patient is alert and oriented x 3.  SKIN: No obvious rash, lesion, or ulcer.    LABORATORY PANEL:   CBC Recent Labs  Lab 11/04/18 0355  WBC 12.2*  HGB 11.8*  HCT 39.0  PLT 183   ------------------------------------------------------------------------------------------------------------------  Chemistries  Recent Labs  Lab 11/04/18 0355  NA 140  K 5.0  CL 112*  CO2 21*  GLUCOSE 203*  BUN 30*  CREATININE 2.54*  CALCIUM 9.2   ------------------------------------------------------------------------------------------------------------------  Cardiac Enzymes No results for input(s): TROPONINI in the last 168 hours. ------------------------------------------------------------------------------------------------------------------  RADIOLOGY:  Dg Shoulder Right Port  Result Date: 11/03/2018 CLINICAL DATA:  Patient status post shoulder replacement today. EXAM: PORTABLE RIGHT SHOULDER COMPARISON:  CT right shoulder 08/25/2018 FINDINGS: Reverse shoulder arthroplasty is in place. Device is located. No fracture. Surgical staples noted. IMPRESSION: Status post reverse shoulder arthroplasty today. No acute abnormality. Electronically Signed   By: Inge Rise M.D.   On: 11/03/2018 13:05     ASSESSMENT AND PLAN:   68 yo female w/ hx of DM, HtN, Hyperlipidemia, hx of CHF, CKD Stage III who was admitted to the hospital for right shoulder arthroplasty.    1. S/p right Shoulder arthroplasty - cont further care and pain control as per Orthopedics.  - awaiting PT eval.   2. CKD stage III - Cr. Close to baseline and can be further followed as  outpatient.   3. DM - BS stable.  - cont. Lantus, SSI.   4. Hyperlipidemia -  cont. Atorvastatin  5. Hx of CHF - clinically not in CHF.  - cont. Lasix, Coreg.   6. GERD - cont. Protonix  7. Hx of Gout - no acute attack.  - cont. Allopurinol.    8. Hyperthyroidism - cont. Methimazole.   Pt. Is stable to be discharged home from medical standpoint. Pt wants to go home today.     All the records are reviewed and case discussed with Care Management/Social Worker. Management plans discussed with the patient, family and they are in agreement.  CODE STATUS: full code  DVT Prophylaxis: Lovenox  TOTAL TIME TAKING CARE OF THIS PATIENT: 25 minutes.   POSSIBLE D/C IN 1-2 DAYS, DEPENDING ON CLINICAL CONDITION.   Henreitta Leber M.D on 11/04/2018 at 8:31 PM  Between 7am to 6pm - Pager - [phone]  After 6pm go to [email or web address] - Proofreader  [facility] [place] [facility]  Office  [phone]  CC: Primary care physician; Leafy Ro, MD

## 2018-11-04 NOTE — Progress Notes (Signed)
  Subjective: 1 Day Post-Op Procedure(s) (LRB): REVERSE SHOULDER ARTHROPLASTY, BICEPS TENODESIS (Right) Patient reports pain as moderate.   Patient seen in rounds with Dr. Posey Pronto. Patient is well, and has had no acute complaints or problems Plan is to go Home after hospital stay. Negative for chest pain and shortness of breath Fever: no Gastrointestinal: Negative for nausea and vomiting  Objective: Vital signs in last 24 hours: Temp:  [97.2 F (36.2 C)-100 F (37.8 C)] 98.7 F (37.1 C) (07/28 0452) Pulse Rate:  [55-93] 80 (07/28 0452) Resp:  [12-20] 18 (07/28 0452) BP: (135-172)/(74-106) 135/86 (07/28 0452) SpO2:  [89 %-100 %] 96 % (07/28 0452)  Intake/Output from previous day:  Intake/Output Summary (Last 24 hours) at 11/04/2018 0701 Last data filed at 11/04/2018 0600 Gross per 24 hour  Intake 2933.07 ml  Output 2170 ml  Net 763.07 ml    Intake/Output this shift: No intake/output data recorded.  Labs: Recent Labs    11/04/18 0355  HGB 11.8*   Recent Labs    11/04/18 0355  WBC 12.2*  RBC 4.05  HCT 39.0  PLT 183   Recent Labs    11/04/18 0355  NA 140  K 5.0  CL 112*  CO2 21*  BUN 30*  CREATININE 2.54*  GLUCOSE 203*  CALCIUM 9.2   No results for input(s): LABPT, INR in the last 72 hours.   EXAM General - Patient is Alert and Oriented Extremity - Neurovascular intact Sensation intact distally Dressing/Incision - clean, dry, with a Hemovac intact.  The Hemovac was removed with no complication.  The tubing was intact on removal. Motor Function - intact, moving fingers and hand well on exam.  The patient is able to give posterior pressure with her elbow and has normal grip strength.  Past Medical History:  Diagnosis Date  . Anginal pain ([place])   . Bell palsy   . CHF (congestive heart failure) ([place])   . Chronic kidney disease   . Complication of anesthesia    slow to wake up  . Diabetes mellitus without complication ([place])   . Dyspnea   .  Environmental and seasonal allergies   . GERD (gastroesophageal reflux disease)   . Glaucoma   . Hypertension   . Hyperthyroidism   . Myocardial infarction ([place])   . Sleep apnea     Assessment/Plan: 1 Day Post-Op Procedure(s) (LRB): REVERSE SHOULDER ARTHROPLASTY, BICEPS TENODESIS (Right) Active Problems:   S/p reverse total shoulder arthroplasty  Estimated body mass index is 41.09 kg/m as calculated from the following:   Height as of this encounter: 5\' 5"  (1.651 m).   Weight as of this encounter: 112 kg. Advance diet Up with therapy D/C IV fluids Discharge home with home health  DVT Prophylaxis - Aspirin Right arm and shoulder immobilizer at all times.  Gentle range of motion with physical therapy.  Reche Dixon, PA-C Orthopaedic Surgery 11/04/2018, 7:01 AM

## 2018-11-04 NOTE — Progress Notes (Signed)
Patient discharged home with wife, discharge education reviewed. Patient reports no questions. All prescriptions given to patient. Lupita Leash

## 2018-11-05 LAB — SURGICAL PATHOLOGY

## 2018-12-29 IMAGING — DX DG SHOULDER 1V*L*
2 series · 2 of 2 positions shown · non-contrast
Comparison: 04/04/2017

CLINICAL DATA: Post LEFT shoulder replacement

EXAM:
LEFT SHOULDER - 1 VIEW

[shoulder ap]
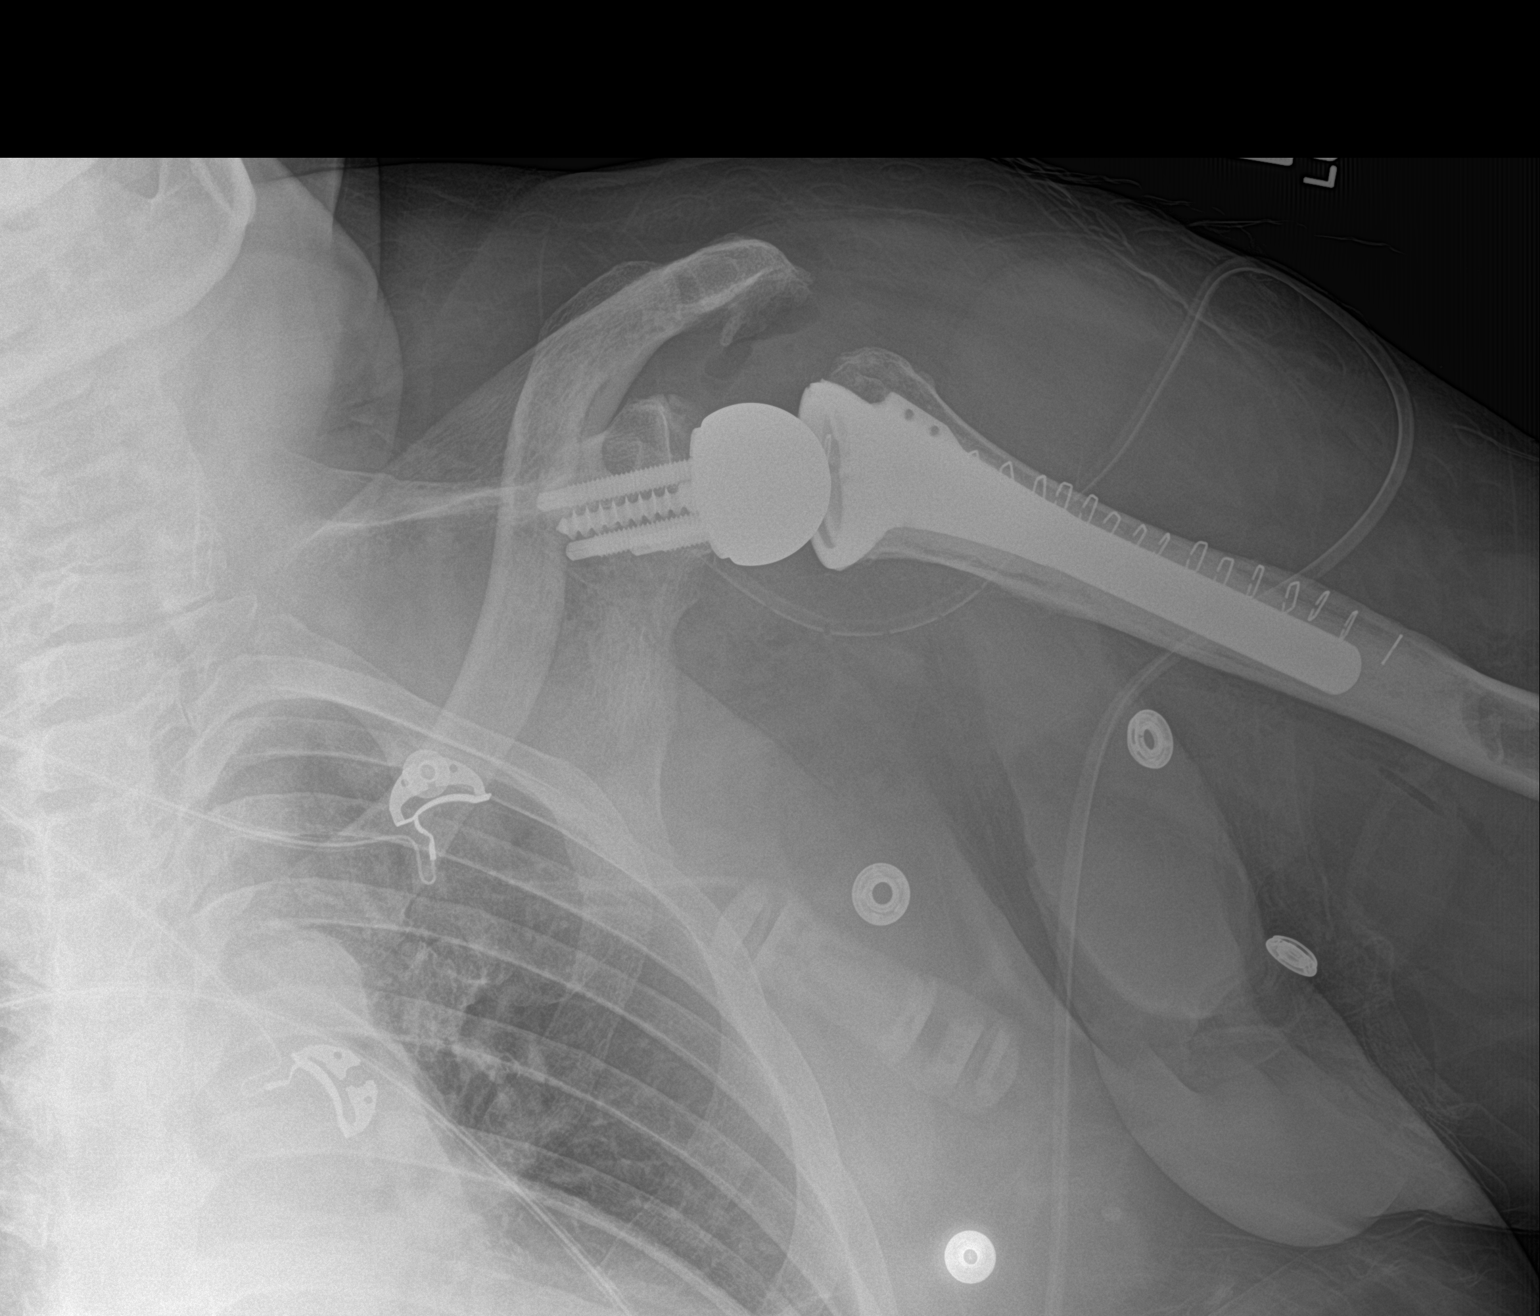

[shoulder axial]
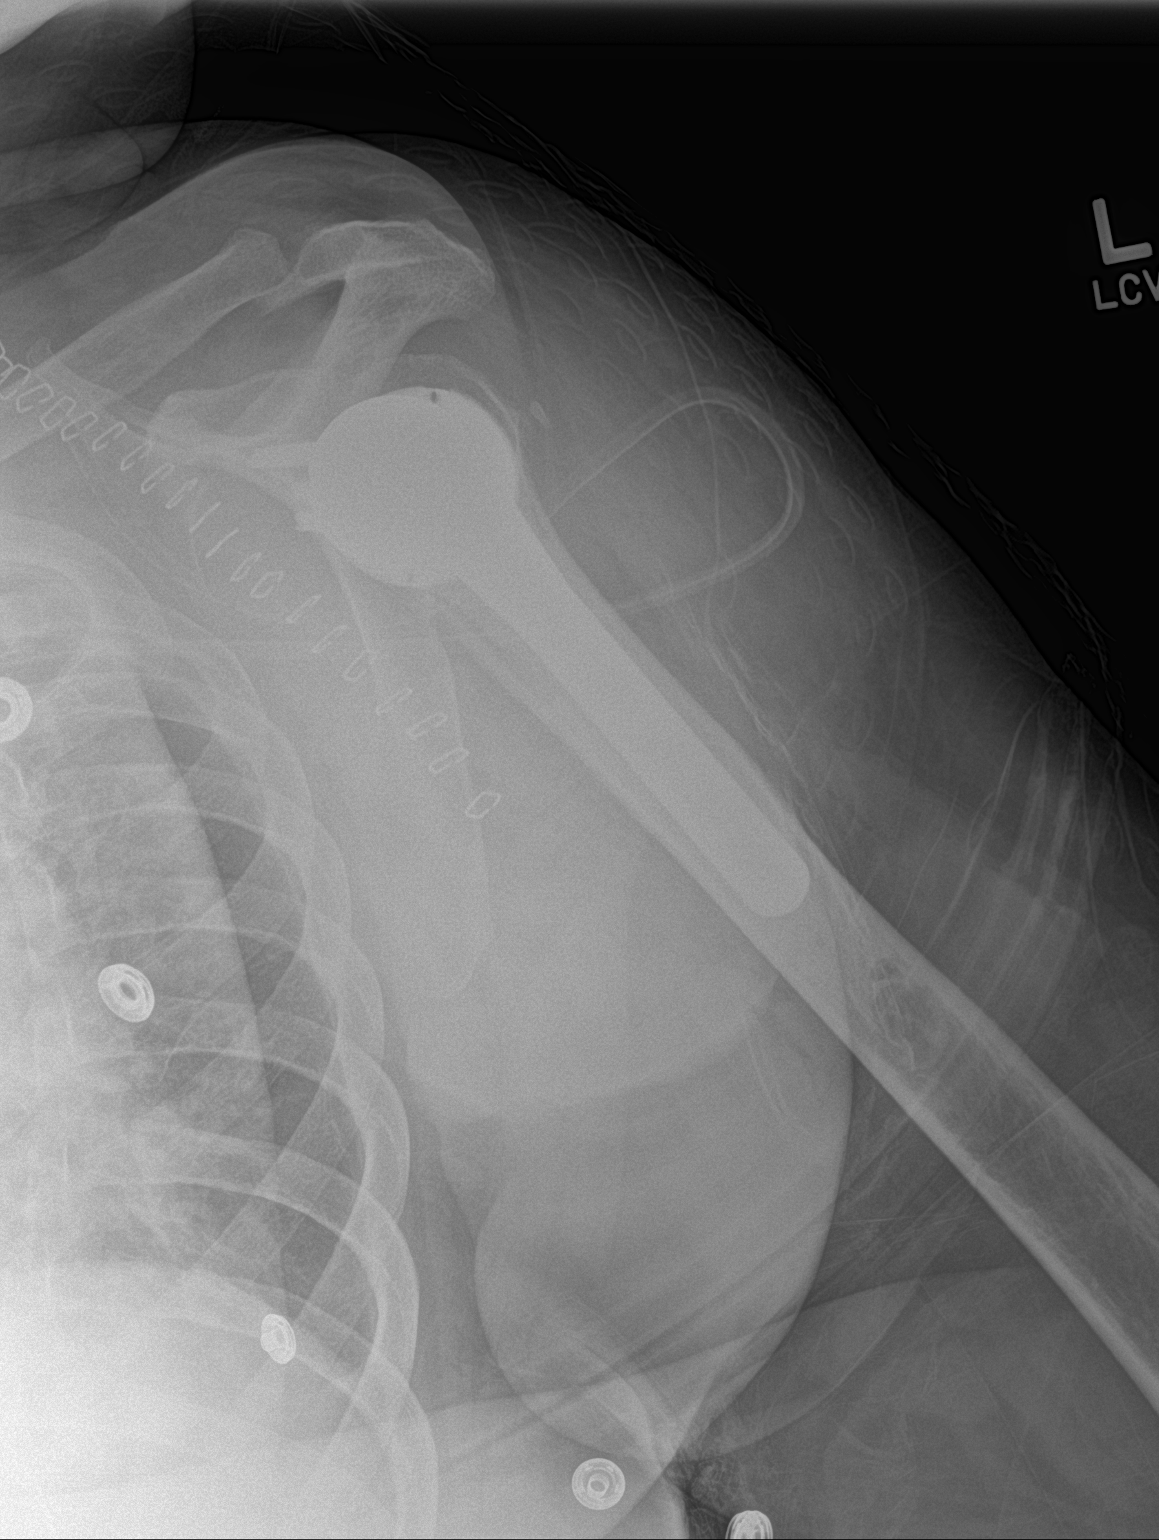

[2 of 2 positions shown; findings below may reference images not displayed]

FINDINGS: Components of a reverse LEFT shoulder arthroplasty are present with
overlying surgical drain.

Bones demineralized.

No fracture or dislocation.

No periprosthetic lucency.

AC joint alignment normal.
IMPRESSION: LEFT shoulder prosthesis without acute complication.

## 2019-04-07 IMAGING — DX DG CHEST 1V PORT
1 series · 1 of 1 positions shown · non-contrast
Comparison: None.

CLINICAL DATA: Weakness.  Nose bleed.

EXAM:
PORTABLE CHEST 1 VIEW

[chest ap]
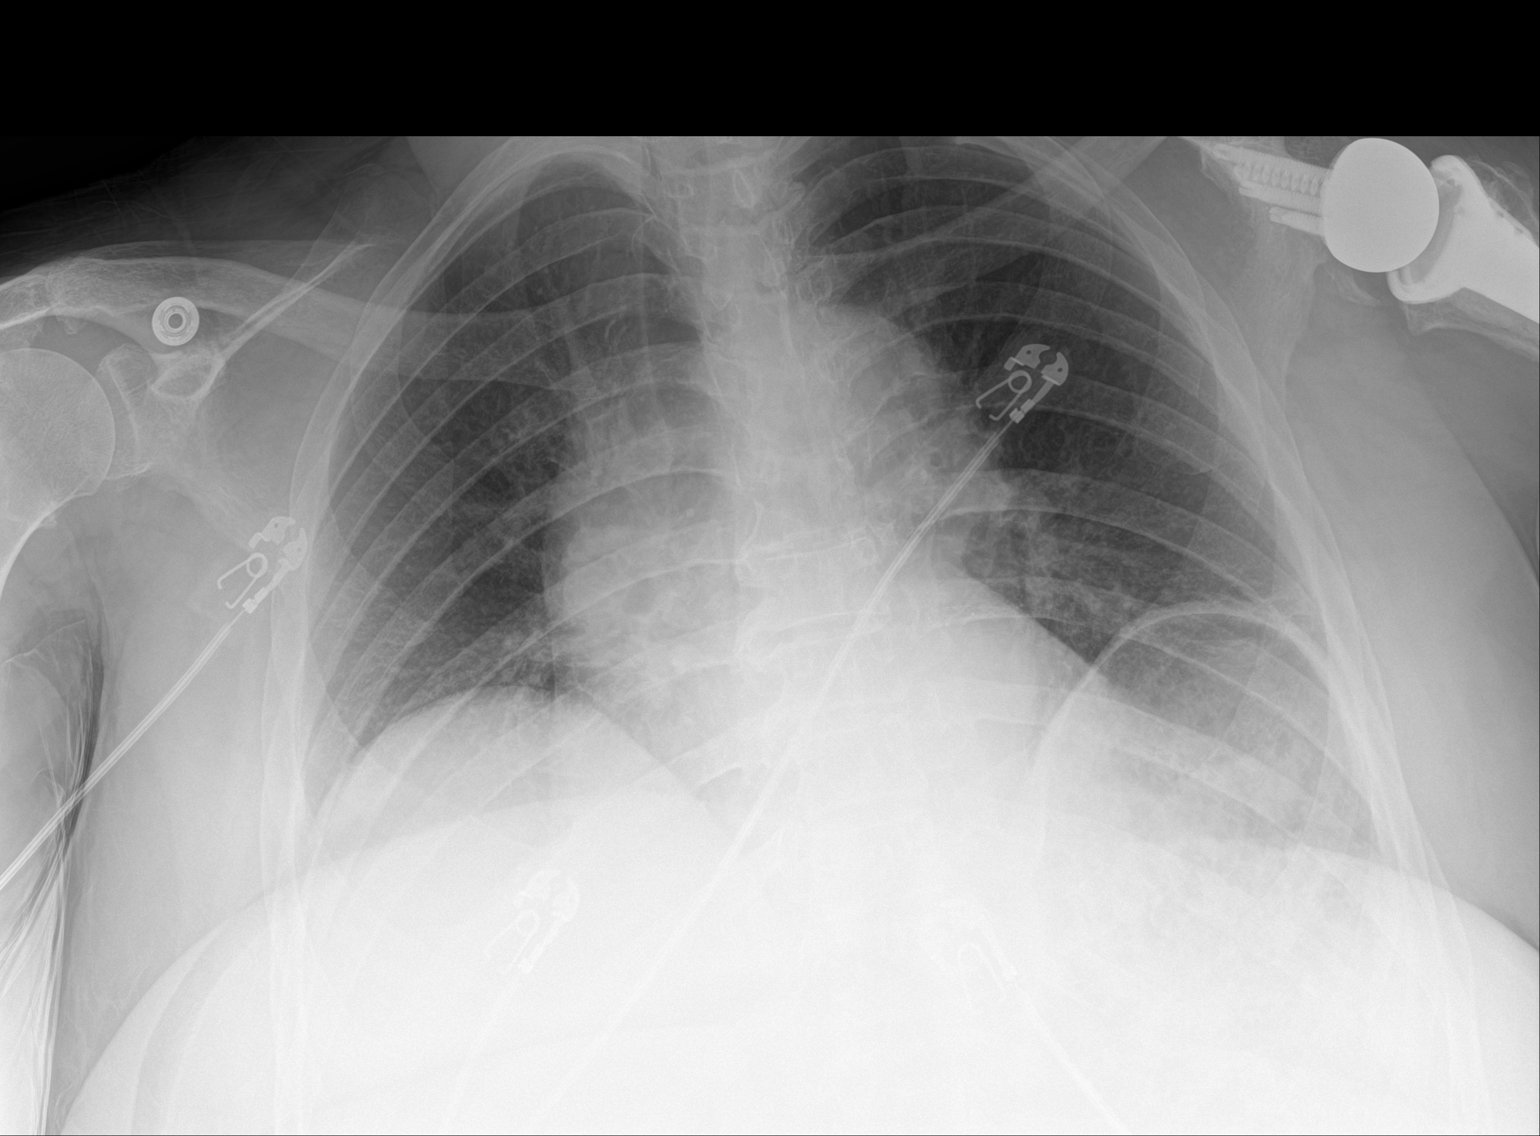

[1 of 1 positions shown; findings below may reference images not displayed]

FINDINGS: Very low lung volumes limits assessment. Heart appears prominent
size. Mild bibasilar atelectasis. No pulmonary edema, large pleural
effusion or pneumothorax. Reverse left shoulder arthroplasty.
Gaseous distention of stomach suspected in the upper abdomen. No
acute osseous abnormalities are seen.
IMPRESSION: Very low lung volumes limit assessment.  Mild bibasilar atelectasis.

## 2020-03-10 ENCOUNTER — Ambulatory Visit: Payer: No Typology Code available for payment source | Admitting: Physical Therapy

## 2020-03-10 ENCOUNTER — Ambulatory Visit: Payer: No Typology Code available for payment source

## 2020-03-10 ENCOUNTER — Ambulatory Visit: Payer: No Typology Code available for payment source | Attending: Internal Medicine | Admitting: Occupational Therapy

## 2020-03-10 ENCOUNTER — Other Ambulatory Visit: Payer: Self-pay

## 2020-03-10 DIAGNOSIS — G8929 Other chronic pain: Secondary | ICD-10-CM | POA: Insufficient documentation

## 2020-03-10 DIAGNOSIS — R208 Other disturbances of skin sensation: Secondary | ICD-10-CM | POA: Insufficient documentation

## 2020-03-10 DIAGNOSIS — M25561 Pain in right knee: Secondary | ICD-10-CM | POA: Insufficient documentation

## 2020-03-10 DIAGNOSIS — M25642 Stiffness of left hand, not elsewhere classified: Secondary | ICD-10-CM | POA: Diagnosis present

## 2020-03-10 DIAGNOSIS — R2681 Unsteadiness on feet: Secondary | ICD-10-CM

## 2020-03-10 DIAGNOSIS — M25562 Pain in left knee: Secondary | ICD-10-CM

## 2020-03-10 DIAGNOSIS — M6281 Muscle weakness (generalized): Secondary | ICD-10-CM | POA: Diagnosis present

## 2020-03-10 DIAGNOSIS — M25522 Pain in left elbow: Secondary | ICD-10-CM | POA: Insufficient documentation

## 2020-03-10 DIAGNOSIS — R2689 Other abnormalities of gait and mobility: Secondary | ICD-10-CM | POA: Insufficient documentation

## 2020-03-10 DIAGNOSIS — M79642 Pain in left hand: Secondary | ICD-10-CM | POA: Insufficient documentation

## 2020-03-10 NOTE — Therapy (Signed)
[facility] [address] [place], [place], [address] Phone: [phone]   Fax:  [phone]  Occupational Therapy Evaluation  Patient Details  Name: Katrina Hughes MRN: [id number] Date of Birth: 1950/05/31 No data recorded  Encounter Date: 03/10/2020   OT End of Session - 03/10/20 1542    Visit Number 1    Number of Visits 15    Date for OT Re-Evaluation 04/28/20    Authorization Type [facility] - Approved 15 visits (already used 1 at the [place] for O.T. )    Authorization Time Period 02/02/20 - 06/01/20    Authorization - Visit Number 2    Authorization - Number of Visits 15           Past Medical History:  Diagnosis Date  . Anginal pain ([place])   . Bell palsy   . CHF (congestive heart failure) ([place])   . Chronic kidney disease   . Complication of anesthesia    slow to wake up  . Diabetes mellitus without complication ([place])   . Dyspnea   . Environmental and seasonal allergies   . GERD (gastroesophageal reflux disease)   . Glaucoma   . Hypertension   . Hyperthyroidism    pt states that it's hypothyroidism  . Myocardial infarction ([place])   . Sleep apnea     Past Surgical History:  Procedure Laterality Date  . ABDOMINAL HYSTERECTOMY    . APPENDECTOMY    . BICEPT TENODESIS Left 06/10/2017   Procedure: BICEPS TENODESIS;  Surgeon: Leim Fabry, MD;  Location: [facility];  Service: Orthopedics;  Laterality: Left;  . BREAST REDUCTION SURGERY    . CATARACT EXTRACTION, BILATERAL Bilateral   . CHOLECYSTECTOMY    . CORONARY ANGIOPLASTY WITH STENT PLACEMENT    . HERNIA REPAIR    . NASAL HEMORRHAGE CONTROL N/A 09/17/2017   Procedure: EPISTAXIS CONTROL;  Surgeon: [name], MD;  Location: [facility];  Service: ENT;  Laterality: N/A;  . REVERSE SHOULDER ARTHROPLASTY Left 06/10/2017   Procedure: REVERSE SHOULDER ARTHROPLASTY;  Surgeon: Leim Fabry, MD;  Location: [facility];  Service: Orthopedics;  Laterality: Left;  . REVERSE  SHOULDER ARTHROPLASTY Right 11/03/2018   Procedure: REVERSE SHOULDER ARTHROPLASTY, BICEPS TENODESIS;  Surgeon: Leim Fabry, MD;  Location: [facility];  Service: Orthopedics;  Laterality: Right;    There were no vitals filed for this visit.   Subjective Assessment - 03/10/20 1449    Patient is accompanied by: Family member   wife   Pertinent History displaced fx of proximal phalanx Lt small finger after fall on 12/10/19    Limitations none    Currently in Pain? Yes    Pain Score 8     Pain Orientation Left    Pain Descriptors / Indicators Aching    Pain Type Chronic pain    Pain Onset More than a month ago    Pain Frequency Intermittent    Aggravating Factors  elevation    Pain Relieving Factors hanging it down             [facility] OT Assessment - 03/10/20 1459      Assessment   Medical Diagnosis displaced fx of proximal phalanx Lt small finger    Onset Date/Surgical Date 12/10/19    Hand Dominance Right    Next MD Visit no scheduled appt.    Prior Therapy 1 previous O.T. session      Precautions   Precautions Fall      Restrictions   Weight  Bearing Restrictions No      Home  Environment   Additional Comments Pt lives in 1 level home w/ ramped entrance    Lives With Spouse      Prior Function   Level of Independence Independent    Vocation Retired    Leisure Bowl, play basketball/softball, (hasn't played in about 12 years)      ADL   Eating/Feeding Independent    Grooming Independent    Upper Body Bathing Independent    Lower Body Bathing Independent    Upper Body Dressing Independent    Lower Geophysicist/field seismologist -  Product/process development scientist Independent      IADL   Shopping --   pt and wife go together   Light Housekeeping --   Aide M-F 10-2 to assist with cooking/cleaning   Medication Management Is responsible for taking medication in correct dosages at correct time    Financial Management --    wife does Pharmacist, hospital Status Independent    Mobility Status Comments walks w/ SPC, electric scooter for shopping      Written Expression   Dominant Hand Right    Handwriting --   denies change     Vision - History   Baseline Vision Wears glasses only for reading      Observation/Other Assessments   Observations Pt reports numbness/tingling along ulnar n. distribution Lt hand. Pt also reports pain with wrist flexion and point tender along wrist flexors attachment at medial epicondyle      Sensation   Additional Comments Lt hand tingling in fingertips, but mostly ulnar side      Coordination   9 Hole Peg Test Right;Left    Right 9 Hole Peg Test 22.28 sec    Left 9 Hole Peg Test 28.43 sec      Edema   Edema mild Lt hand and small finger PIP joint      AROM   Overall AROM Comments BUE AROM WFL's with exception to Lt hand. (pt also has had bilateral sh surgery in 2019 & 2020). Pt only has approx 40% composite finger flexion, full extension, small finger clawing noted (full pasive PIP extension, but goes into MP hyperextension and PIP flex). Pt also has difficulty with finger and thumb adduction      Hand Function   Right Hand Grip (lbs) 55.1 lbs    Left Hand Grip (lbs) 9.2 lbs                             OT Short Term Goals - 03/10/20 1550      OT SHORT TERM GOAL #1   Title Independent with initial HEP    Time 3    Period Weeks    Status New      OT SHORT TERM GOAL #2   Title Pt to demo 75% composite finger flexion in prep for better grasping    Baseline 40%    Time 3    Period Weeks    Status New      OT SHORT TERM GOAL #3   Title Pt to report decreased numbness and pain along ulnar n. distribution and at medial elbow w/ wrist flexion    Time 3    Period Weeks    Status New  OT SHORT TERM GOAL #4   Title Pt to improve grip strength Lt hand to 15 lbs    Baseline 9 lbs    Time 3    Period Weeks    Status New              OT Long Term Goals - 03/10/20 1552      OT LONG TERM GOAL #1   Title Independent with updated HEP    Time 7    Period Weeks    Status New      OT LONG TERM GOAL #2   Title Pt to demo 90% or greater full composite flexion Lt hand    Time 7    Period Weeks    Status New      OT LONG TERM GOAL #3   Title Pt to demo 20 lbs or greater grip strength Lt hand    Baseline 9 lbs    Time 7    Period Weeks    Status New      OT LONG TERM GOAL #4   Title Pt to consistently use Lt hand as assist for bilateral tasks    Time 7    Period Weeks    Status New                 Plan - 03/10/20 1543    Clinical Impression Statement Pt is a 69 y.o. female who presents to [facility] s/p displaced fx of proximal phalanx Lt small finger from fall on 12/10/19. Pt now stiff entire Lt hand with decreased ROM and strength. Pt also appears to have some numbness along ulnar n. distribution and difficlty w/ motor movements of ulnar nerve, pain with wrist flexion (especially resistive) and point tender at medial epicondyle. Suspect some medial epicondylitis from fall as well which may be causing some compression of ulnar n. at elbow d/t internal swelling from nature of fall (pt reports landing on Lt side along elbow, forearm, and ulnar side of hand). Pt would benefit from O.T. to address small finger, but also entire hand, and possible medial epicondylitis and ulnar neuropathy.    OT Occupational Profile and History Detailed Assessment- Review of Records and additional review of physical, cognitive, psychosocial history related to current functional performance    Occupational performance deficits (Please refer to evaluation for details): IADL's    Body Structure / Function / Physical Skills Strength;Pain;Dexterity;Edema;UE functional use;IADL;ROM;Flexibility;Sensation;Coordination    Rehab Potential Good    Clinical Decision Making Several treatment options, min-mod task modification necessary     Comorbidities Affecting Occupational Performance: May have comorbidities impacting occupational performance    Modification or Assistance to Complete Evaluation  No modification of tasks or assist necessary to complete eval    OT Frequency 2x / week    OT Duration --   for 7 weeks   OT Treatment/Interventions Self-care/ADL training;Moist Heat;Fluidtherapy;DME and/or AE instruction;Splinting;Therapeutic activities;Ultrasound;Therapeutic exercise;Cryotherapy;Neuromuscular education;Functional Mobility Training;Passive range of motion;Electrical Stimulation;Paraffin;Manual Therapy;Patient/family education    Plan issue intrinsic hand ex's, issue yellow putty, consider following ex's/protocol from medial epicondylitis program    Consulted and Agree with Plan of Care Patient;Family member/caregiver    Family Member Consulted wife           Patient will benefit from skilled therapeutic intervention in order to improve the following deficits and impairments:   Body Structure / Function / Physical Skills: Strength, Pain, Dexterity, Edema, UE functional use, IADL, ROM, Flexibility, Sensation, Coordination  Visit Diagnosis: Pain in left hand  Stiffness of left hand, not elsewhere classified  Muscle weakness (generalized)  Pain in left elbow  Other disturbances of skin sensation    Problem List Patient Active Problem List   Diagnosis Date Noted  . 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 Status post shoulder replacement 06/10/2017    Carey Bullocks, OTR/L 03/10/2020, 3:55 PM  [facility] [address] [place], [place], [address] Phone: [phone]   Fax:  [phone]  Name: Sophonie Goforth MRN: [id number] Date of Birth: 12-Jun-1950   Physician: Gomez Cleverly, MD  Certification Start Date: 62/7/03 Certification End Date: 06/01/20  Physician Documentation Your signature is required to  indicate approval of the treatment plan as stated above.  Please sign and either send electronically or make a copy of this report for your files and return this physician signed original.  Please mark one 1.__approve of plan   2. ___approve of plan with the followingconditions. ____________________________________________________________________________________________________________________________________________   ______________________                                                       _____________________ Physician Signature                                                                     Date    Faxed to MD for signature

## 2020-03-10 NOTE — Therapy (Signed)
[facility] [address] [place], [place], [address] Phone: [phone]   Fax:  [phone]  Physical Therapy Evaluation  Patient Details  Name: Katrina Hughes MRN: [id number] Date of Birth: [birth year] Referring Provider (PT): Gomez Cleverly, MD   Encounter Date: 03/10/2020   PT End of Session - 03/10/20 1559    Visit Number 1    Number of Visits 15   VA has limited to 15   Date for PT Re-Evaluation 05/09/20    Authorization Type [facility], 15 visits    Authorization Time Period 02/02/20-06/01/20    Authorization - Visit Number 1    Authorization - Number of Visits 15    PT Start Time 8182    PT Stop Time 1444    PT Time Calculation (min) 41 min    Equipment Utilized During Treatment Other (comment)   min A to S prn   Activity Tolerance Patient tolerated treatment well    Behavior During Therapy [facility] for tasks assessed/performed           Past Medical History:  Diagnosis Date  . Anginal pain ([place])   . Bell palsy   . CHF (congestive heart failure) ([place])   . Chronic kidney disease   . Complication of anesthesia    slow to wake up  . Diabetes mellitus without complication ([place])   . Dyspnea   . Environmental and seasonal allergies   . GERD (gastroesophageal reflux disease)   . Glaucoma   . Hypertension   . Hyperthyroidism    pt states that it's hypothyroidism  . Myocardial infarction ([place])   . Sleep apnea     Past Surgical History:  Procedure Laterality Date  . ABDOMINAL HYSTERECTOMY    . APPENDECTOMY    . BICEPT TENODESIS Left 06/10/2017   Procedure: BICEPS TENODESIS;  Surgeon: Leim Fabry, MD;  Location: [facility];  Service: Orthopedics;  Laterality: Left;  . BREAST REDUCTION SURGERY    . CATARACT EXTRACTION, BILATERAL Bilateral   . CHOLECYSTECTOMY    . CORONARY ANGIOPLASTY WITH STENT PLACEMENT    . HERNIA REPAIR    . NASAL HEMORRHAGE CONTROL N/A 09/17/2017   Procedure: EPISTAXIS CONTROL;  Surgeon:  [name], MD;  Location: [facility];  Service: ENT;  Laterality: N/A;  . REVERSE SHOULDER ARTHROPLASTY Left 06/10/2017   Procedure: REVERSE SHOULDER ARTHROPLASTY;  Surgeon: Leim Fabry, MD;  Location: [facility];  Service: Orthopedics;  Laterality: Left;  . REVERSE SHOULDER ARTHROPLASTY Right 11/03/2018   Procedure: REVERSE SHOULDER ARTHROPLASTY, BICEPS TENODESIS;  Surgeon: Leim Fabry, MD;  Location: [facility];  Service: Orthopedics;  Laterality: Right;    There were no vitals filed for this visit.    Subjective Assessment - 03/10/20 1413    Subjective Pt reported B knee pain but L knee is the worse as it swells and locks up. She has to shake it prior to amb. Pain has been getting progressively worse. Pt amb. to PT room with [facility] but states she normally uses rollator. Pt has trouble climbing stairs (uses ramp at home), amb., getting in/out of bathtub, bowling, basketball. Pt has fallen twice in the last six months. Pt states she gets lightheaded and that's when she has fallen.    Patient is accompained by: Family member   wife: [name]   Pertinent History Anginal pain, L Bell palsy, CHF, CKD, DM, dyspnea, seasonal allergies, GERD, glaucoma-had surgery to repair, MI (20+ years ago per pt), hyperthyroidism (per chart but pt states it's hypo),  HTN, sleep apnea    How long can you stand comfortably? 20-25 minutes    How long can you walk comfortably? Unsure of time but stated about 33' before L knee pain incr.    Patient Stated Goals To go to grocery store without having to ride electric scooter, improve balance to walk further, and decr. pain    Currently in Pain? Yes    Pain Score 9     Pain Location Knee    Pain Orientation Left    Pain Descriptors / Indicators Aching    Pain Type Chronic pain    Pain Onset More than a month ago    Pain Frequency Intermittent    Aggravating Factors  standing, walking    Pain Relieving Factors Ice, heat, tylenol, Hydrocodone              [facility]  - 03/10/20 1423      Assessment   Medical Diagnosis B knee pain, OA    Referring Provider (PT) Gomez Cleverly, MD    Onset Date/Surgical Date 09/09/19    Hand Dominance Right    Next MD Visit no scheduled appt.    Prior Therapy [facility] ortho 1-2 years ago      Precautions   Precautions Fall      Restrictions   Weight Bearing Restrictions No      Balance Screen   Has the patient fallen in the past 6 months Yes    How many times? 2    Has the patient had a decrease in activity level because of a fear of falling?  Yes    Is the patient reluctant to leave their home because of a fear of falling?  Yes      [place] Private residence    Living Arrangements Spouse/significant other    Available Help at Discharge Family;Available 24 hours/day    Type of Home Mobile home    [place] - single point;Walker - 4 wheels;Electric scooter;Grab bars - tub/shower;Shower seat      Prior Function   Level of Independence Independent    Vocation Retired    Manufacturing systems engineer, play basketball/softball, (hasn't played in about 12 years)      Cognition   Overall Cognitive Status Within Functional Limits for tasks assessed   pt's wife states she forgets     Sensation   Light Touch Appears Intact    Additional Comments No N/T in LEs but pt reported N/T in hands.       Coordination   Gross Motor Movements are Fluid and Coordinated No    Fine Motor Movements are Fluid and Coordinated No      Posture/Postural Control   Posture/Postural Control Postural limitations    Postural Limitations Increased lumbar lordosis      ROM / Strength   AROM / PROM / Strength AROM;Strength      AROM   Overall AROM Comments PT unable to formally assess BLE ROM 2/2 pt's jeans-will assess next sesion.      Strength   Overall Strength Deficits    Overall Strength Comments RLE: grossly 4+/5. LLE: hip flex: 2/5 (unable to  move through full range), knee ext: 4/5, knee flex: 4/5, ankle DF: 4/5. B hip abd/add tested in seated position: 3+/5.      Transfers   Transfers Sit to Stand;Stand to Sit  Sit to Stand 5: Supervision;With upper extremity assist;From chair/3-in-1    Stand to Sit 5: Supervision;With upper extremity assist;To chair/3-in-1      Ambulation/Gait   Ambulation/Gait Yes    Ambulation/Gait Assistance 5: Supervision    Ambulation/Gait Assistance Details S to ensure safety. Pt has to wait a minute to amb. 2/2 knee locking up.    Ambulation Distance (Feet) 100 Feet    Assistive device Straight cane    Gait Pattern Step-through pattern;Decreased stride length;Decreased stance time - left;Antalgic    Ambulation Surface Level;Indoor    Gait velocity 1.40ft/sec. with SPC      Balance   Balance Assessed Yes      Static Standing Balance   Static Standing - Balance Support No upper extremity supported;Left upper extremity supported    Static Standing - Level of Assistance 5: Stand by assistance;4: Min assist    Static Standing Balance -  Activities  Single Leg Stance - Right Leg;Single Leg Stance - Left Leg;Romberg - Eyes Opened;Romberg - Eyes Closed    Static Standing - Comment/# of Minutes R SLS: 4 sec., L SLS with pt holding counter to start: 3 sec., feet apart eyes close: 10 sec incr. sway                      Objective measurements completed on examination: See above findings.               PT Education - 03/10/20 1559    Education Details PT educated pt on outcome measures, exam findings, PT frequency, duration, and auth from [place]. PT requested pt bring rollator next visit and wear shorts or pants that can pull past knees to examine fully.    Person(s) Educated Patient;Spouse    Methods Explanation    Comprehension Verbalized understanding            PT Short Term Goals - 03/10/20 1606      PT SHORT TERM GOAL #1   Title Pt will be IND with HEP to improve pain,  balance, flexibility and strength. TARGET DATE FOR ALL STGS: 04/07/20    Time 4    Period Weeks    Status New      PT SHORT TERM GOAL #2   Title Perform BERG and write goals as indicated.    Time 4    Period Weeks    Status New      PT SHORT TERM GOAL #3   Title Pt will improve gait speed with LRAD to >/=1.46ft/sec. to decr. falls risk.    Time 4    Period Weeks    Status New      PT SHORT TERM GOAL #4   Title Pt will report knee pain decr. to 7/10 at worst, on average to improve functional mobility.    Time 4    Period Weeks    Status New      PT SHORT TERM GOAL #5   Title Pt will amb. 300' at MOD I level with LRAD, with <7/10 knee pan to improve functional mobility.    Time 4    Period Weeks    Status New             PT Long Term Goals - 03/10/20 1608      PT LONG TERM GOAL #1   Title Pt will report no falls in last four weeks to improve safety. TARGET DATE FOR ALL LTGS: 05/02/20    Time 8  Period Weeks    Status New      PT LONG TERM GOAL #2   Title Pt will amb. 500' in/outdoors at MOD I level with LRAD with </=5/10 knee pain to improve functional mobility.    Time 8    Period Weeks    Status New      PT LONG TERM GOAL #3   Title Pt will be able to attempt bowling in order to improve QOL.    Time 8    Period Weeks    Status New      PT LONG TERM GOAL #4   Title Pt will report ability to grocery shop with LRAD and no LOB or incr. pain to perform IADLs.    Time 8    Period Weeks    Status New                  Plan - 03/10/20 1601    Clinical Impression Statement Pt is a pleasant 69y/o female presenting to [facility] neuro with B knee pain, L>R knee and balance issues. Pt's PMH is significant for the following: Anginal pain, L Bell palsy, CHF, CKD, DM, dyspnea, seasonal allergies, GERD, glaucoma-had surgery to repair, MI (20+ years ago per pt), hyperthyroidism (per chart but pt states it's hypo), HTN, sleep apnea. Pt's gait speed indicated pt is at  risk for falls. The following deficits were noted upon exam: gait deviations, decr. strength, impaired flexibility, postural dysfunction, limited ROM, impaired balance, B knee pain L>R. Pt would benefit from skilled PT to improve pain and safety during functional mobility.    Personal Factors and Comorbidities Age;Fitness;Comorbidity 3+    Comorbidities Anginal pain, L Bell palsy, CHF, CKD, DM, dyspnea, seasonal allergies, GERD, glaucoma-had surgery to repair, MI (20+ years ago per pt), hyperthyroidism (per chart but pt states it's hypo), HTN, sleep apnea    Examination-Activity Limitations Bed Mobility;Bend;Carry;Continence;Toileting;Stand;Stairs;Squat;Locomotion Level;Transfers    Examination-Participation Restrictions Driving;Meal Prep;Interpersonal Relationship;Laundry    Stability/Clinical Decision Making Evolving/Moderate complexity    Clinical Decision Making Moderate    Rehab Potential Good    PT Frequency --   2x/week for 6 weeks and then 1x/week for 2 weeks based on [facility] visit limit of 15.   PT Duration --   2x/week for 6 weeks and then 1x/week for 2 weeks based on [facility] visit limit of 15.   PT Treatment/Interventions ADLs/Self Care Home Management;Aquatic Therapy;Biofeedback;Canalith Repostioning;DME Instruction;Balance training;Therapeutic exercise;Therapeutic activities;Functional mobility training;Stair training;Gait training;Neuromuscular re-education;Patient/family education;Orthotic Fit/Training;Manual techniques;Vestibular    PT Next Visit Plan Perform BERG and write goals as indicated. Establish strengthening, flexibility and balance HEP.    Consulted and Agree with Plan of Care Patient;Family member/caregiver    Family Member Consulted wife: [name]           Patient will benefit from skilled therapeutic intervention in order to improve the following deficits and impairments:  Abnormal gait, Decreased range of motion, Difficulty walking, Dizziness, Decreased knowledge of precautions,  Decreased endurance, Decreased balance, Decreased knowledge of use of DME, Decreased mobility, Decreased strength, Increased edema, Postural dysfunction, Impaired flexibility, Pain  Visit Diagnosis: Chronic pain of both knees  Other abnormalities of gait and mobility  Unsteadiness on feet  Muscle weakness (generalized)     Problem List Patient Active Problem List   Diagnosis Date Noted  . 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 Status post shoulder replacement 06/10/2017    Gilbert Narain L 03/10/2020, 4:11 PM  [facility] [address]  [place], [place], [address] Phone: [phone]   Fax:  [phone]  Name: Emmersen Garraway MRN: [id number] Date of Birth: 1950/11/26  Physician: Dr. Gomez Cleverly  Certification Start Date: 94/7/65 Certification End Date: 06/01/20  Physician Documentation Your signature is required to indicate approval of the treatment plan as stated above.  Please sign and either send electronically or make a copy of this report for your files and return this physician signed original.  Please mark one 1.__approve of plan   2. ___approve of plan with the followingconditions. ____________________________________________________________________________________________________________________________________________   ______________________                                                       _____________________ Physician Signature                                                                     Date    Faxed to MD for signature   Geoffry Paradise, PT,DPT 03/10/20 4:12 PM Phone: [phone] Fax: [phone]

## 2020-03-14 ENCOUNTER — Other Ambulatory Visit: Payer: Self-pay

## 2020-03-14 ENCOUNTER — Ambulatory Visit: Payer: No Typology Code available for payment source | Admitting: Rehabilitation

## 2020-03-14 ENCOUNTER — Encounter: Payer: Self-pay | Admitting: Rehabilitation

## 2020-03-14 DIAGNOSIS — R2689 Other abnormalities of gait and mobility: Secondary | ICD-10-CM

## 2020-03-14 DIAGNOSIS — M6281 Muscle weakness (generalized): Secondary | ICD-10-CM

## 2020-03-14 DIAGNOSIS — R2681 Unsteadiness on feet: Secondary | ICD-10-CM

## 2020-03-14 DIAGNOSIS — G8929 Other chronic pain: Secondary | ICD-10-CM

## 2020-03-14 DIAGNOSIS — M79642 Pain in left hand: Secondary | ICD-10-CM | POA: Diagnosis not present

## 2020-03-14 NOTE — Patient Instructions (Signed)
Access Code: [email or web address] URL: [email or web address] Date: 03/14/2020 Prepared by: Cameron Sprang  Exercises Supine Active Straight Leg Raise - 1-2 x daily - 5 x weekly - 1 sets - 10 reps Supine Heel Slide with Strap - 1-2 x daily - 5 x weekly - 1 sets - 10 reps Clamshell - 1-2 x daily - 5 x weekly - 1 sets - 10 reps

## 2020-03-14 NOTE — Therapy (Signed)
[facility] [address] [place], [place], [address] Phone: [phone]   Fax:  [phone]  Physical Therapy Treatment  Patient Details  Name: Katrina Hughes MRN: [id number] Date of Birth: 04/22/1950 Referring Provider (PT): Gomez Cleverly, MD   Encounter Date: 03/14/2020   PT End of Session - 03/14/20 1501    Visit Number 2    Number of Visits 15   [facility] has limited to [facility]   Date for PT Re-Evaluation 05/09/20    Authorization Type [facility], 15 visits    Authorization Time Period 02/02/20-06/01/20    Authorization - Visit Number 2    Authorization - Number of Visits 15    PT Start Time 1406    PT Stop Time 1445    PT Time Calculation (min) 39 min    Equipment Utilized During Treatment Other (comment)   min A to S prn   Activity Tolerance Patient tolerated treatment well    Behavior During Therapy [facility] for tasks assessed/performed           Past Medical History:  Diagnosis Date  . Anginal pain ([place])   . Bell palsy   . CHF (congestive heart failure) ([place])   . Chronic kidney disease   . Complication of anesthesia    slow to wake up  . Diabetes mellitus without complication ([place])   . Dyspnea   . Environmental and seasonal allergies   . GERD (gastroesophageal reflux disease)   . Glaucoma   . Hypertension   . Hyperthyroidism    pt states that it's hypothyroidism  . Myocardial infarction ([place])   . Sleep apnea     Past Surgical History:  Procedure Laterality Date  . ABDOMINAL HYSTERECTOMY    . APPENDECTOMY    . BICEPT TENODESIS Left 06/10/2017   Procedure: BICEPS TENODESIS;  Surgeon: Leim Fabry, MD;  Location: [facility];  Service: Orthopedics;  Laterality: Left;  . BREAST REDUCTION SURGERY    . CATARACT EXTRACTION, BILATERAL Bilateral   . CHOLECYSTECTOMY    . CORONARY ANGIOPLASTY WITH STENT PLACEMENT    . HERNIA REPAIR    . NASAL HEMORRHAGE CONTROL N/A 09/17/2017   Procedure: EPISTAXIS CONTROL;  Surgeon:  [name], MD;  Location: [facility];  Service: ENT;  Laterality: N/A;  . REVERSE SHOULDER ARTHROPLASTY Left 06/10/2017   Procedure: REVERSE SHOULDER ARTHROPLASTY;  Surgeon: Leim Fabry, MD;  Location: [facility];  Service: Orthopedics;  Laterality: Left;  . REVERSE SHOULDER ARTHROPLASTY Right 11/03/2018   Procedure: REVERSE SHOULDER ARTHROPLASTY, BICEPS TENODESIS;  Surgeon: Leim Fabry, MD;  Location: [facility];  Service: Orthopedics;  Laterality: Right;    There were no vitals filed for this visit.   Subjective Assessment - 03/14/20 1409    Subjective Reports 7/10 pain in L knee, did rub some cream on before.    Pertinent History Anginal pain, L Bell palsy, CHF, CKD, DM, dyspnea, seasonal allergies, GERD, glaucoma-had surgery to repair, MI (20+ years ago per pt), hyperthyroidism (per chart but pt states it's hypo), HTN, sleep apnea    How long can you stand comfortably? 20-25 minutes    How long can you walk comfortably? Unsure of time but stated about 56' before L knee pain incr.    Patient Stated Goals To go to grocery store without having to ride electric scooter, improve balance to walk further, and decr. pain    Currently in Pain? Yes    Pain Score 7     Pain Location Knee  Pain Orientation Left    Pain Descriptors / Indicators Aching    Pain Type Chronic pain    Pain Onset More than a month ago    Pain Frequency Intermittent    Aggravating Factors  elevation    Pain Relieving Factors hanging it down              [facility] PT Assessment - 03/14/20 1413      AROM   Overall AROM  Deficits    AROM Assessment Site Knee    Right/Left Knee Left;Right    Right Knee Extension -3   from full ext    Right Knee Flexion 108    Left Knee Extension -20   from full ext   Left Knee Flexion 100   with pain      Ambulation/Gait   Ambulation/Gait Yes    Ambulation/Gait Assistance 5: Supervision    Ambulation/Gait Assistance Details Pt ambulatory without device today.  she was slightly late  to session and rushed in without device.     Ambulation Distance (Feet) 100 Feet    Assistive device None    Gait Pattern Step-through pattern;Decreased stride length;Decreased stance time - left;Antalgic    Ambulation Surface Level;Indoor      Standardized Balance Assessment   Standardized Balance Assessment Berg Balance Test      Berg Balance Test   Sit to Stand Able to stand  independently using hands    Standing Unsupported Able to stand safely 2 minutes   Less weight shift on LLE    Sitting with Back Unsupported but Feet Supported on Floor or Stool Able to sit safely and securely 2 minutes    Stand to Sit Sits safely with minimal use of hands    Transfers Able to transfer safely, minor use of hands    Standing Unsupported with Eyes Closed Able to stand 10 seconds safely    Standing Unsupported with Feet Together Able to place feet together independently and stand 1 minute safely   shoes less weight shift on LLE   From Standing, Reach Forward with Outstretched Arm Can reach confidently >25 cm (10")    From Standing Position, Pick up Object from Floor Able to pick up shoe, needs supervision    From Standing Position, Turn to Look Behind Over each Shoulder Looks behind one side only/other side shows less weight shift    Turn 360 Degrees Able to turn 360 degrees safely but slowly    Standing Unsupported, Alternately Place Feet on Step/Stool Able to complete 4 steps without aid or supervision    Standing Unsupported, One Foot in Front Needs help to step but can hold 15 seconds    Standing on One Leg Able to lift leg independently and hold 5-10 seconds   RLE   Total Score 45    Berg comment: 37-45 significant (>80%)                  Access Code: [email or web address] URL: [email or web address] Date: 03/14/2020 Prepared by: Cameron Sprang  Exercises Supine Active Straight Leg Raise - 1-2 x daily - 5 x weekly - 1 sets - 10 reps Supine Heel Slide with Strap - 1-2 x daily - 5 x  weekly - 1 sets - 10 reps Clamshell - 1-2 x daily - 5 x weekly - 1 sets - 10 reps                 PT Education - 03/14/20 1459    Education  Details Educated on score of BERG balance test, HEP for strengthening, and recommendations on getting ortho MD appt here in [place] (if able) or at the [place].    Person(s) Educated Patient    Methods Explanation;Demonstration;Handout    Comprehension Verbalized understanding;Returned demonstration            PT Short Term Goals - 03/14/20 1530      PT SHORT TERM GOAL #1   Title Pt will be IND with HEP to improve pain, balance, flexibility and strength. TARGET DATE FOR ALL STGS: 04/07/20    Time 4    Period Weeks    Status New      PT SHORT TERM GOAL #2   Title Perform BERG and write goals as indicated.    Time 4    Period Weeks    Status Achieved      PT SHORT TERM GOAL #3   Title Pt will improve gait speed with LRAD to >/=1.7ft/sec. to decr. falls risk.    Time 4    Period Weeks    Status New      PT SHORT TERM GOAL #4   Title Pt will report knee pain decr. to 7/10 at worst, on average to improve functional mobility.    Time 4    Period Weeks    Status New      PT SHORT TERM GOAL #5   Title Pt will amb. 300' at MOD I level with LRAD, with <7/10 knee pan to improve functional mobility.    Time 4    Period Weeks    Status New      Additional Short Term Goals   Additional Short Term Goals Yes      PT SHORT TERM GOAL #6   Title Pt will improve BERG balance score to >/=49/56 in order to indicate dec fall risk.    Time 4    Period Weeks    Status New             PT Long Term Goals - 03/14/20 1531      PT LONG TERM GOAL #1   Title Pt will report no falls in last four weeks to improve safety. TARGET DATE FOR ALL LTGS: 05/02/20    Time 8    Period Weeks    Status New      PT LONG TERM GOAL #2   Title Pt will amb. 500' in/outdoors at MOD I level with LRAD with </=5/10 knee pain to improve functional  mobility.    Time 8    Period Weeks    Status New      PT LONG TERM GOAL #3   Title Pt will be able to attempt bowling in order to improve QOL.    Time 8    Period Weeks    Status New      PT LONG TERM GOAL #4   Title Pt will report ability to grocery shop with LRAD and no LOB or incr. pain to perform IADLs.    Time 8    Period Weeks    Status New      PT LONG TERM GOAL #5   Title Pt will score >/=53/56 on BERG balance (and perform DGI/FGA as able) in order to indicate dec fall risk.    Time 8    Period Weeks    Status New                 Plan - 03/14/20  1526    Clinical Impression Statement Skilled session focused on formal balance assessment with BERG balance test.  Pt scored 45/56 placing pt between moderate and significant fall risk.  Recommend pt at least use cane at all times due to fall risk.  Also initiated HEP for BLE strengthening (hip and knee).  Pt tolerated well with cues for continued breathing throughout.    Personal Factors and Comorbidities Age;Fitness;Comorbidity 3+    Comorbidities Anginal pain, L Bell palsy, CHF, CKD, DM, dyspnea, seasonal allergies, GERD, glaucoma-had surgery to repair, MI (20+ years ago per pt), hyperthyroidism (per chart but pt states it's hypo), HTN, sleep apnea    Examination-Activity Limitations Bed Mobility;Bend;Carry;Continence;Toileting;Stand;Stairs;Squat;Locomotion Level;Transfers    Examination-Participation Restrictions Driving;Meal Prep;Interpersonal Relationship;Laundry    Stability/Clinical Decision Making Evolving/Moderate complexity    Rehab Potential Good    PT Frequency --   2x/week for 6 weeks and then 1x/week for 2 weeks based on [facility] visit limit of 15.   PT Duration --   2x/week for 6 weeks and then 1x/week for 2 weeks based on [facility] visit limit of 15.   PT Treatment/Interventions ADLs/Self Care Home Management;Aquatic Therapy;Biofeedback;Canalith Repostioning;DME Instruction;Balance training;Therapeutic  exercise;Therapeutic activities;Functional mobility training;Stair training;Gait training;Neuromuscular re-education;Patient/family education;Orthotic Fit/Training;Manual techniques;Vestibular    PT Next Visit Plan see how HEP is going, add strength/flexibility and balance.  Maybe gentle strength/ROM with nustep/stepper? Did she get [facility] touch with [facility] to set up an ortho MD appt.    Consulted and Agree with Plan of Care Patient           Patient will benefit from skilled therapeutic intervention in order to improve the following deficits and impairments:  Abnormal gait, Decreased range of motion, Difficulty walking, Dizziness, Decreased knowledge of precautions, Decreased endurance, Decreased balance, Decreased knowledge of use of DME, Decreased mobility, Decreased strength, Increased edema, Postural dysfunction, Impaired flexibility, Pain  Visit Diagnosis: Chronic pain of both knees  Other abnormalities of gait and mobility  Unsteadiness on feet  Muscle weakness (generalized)     Problem List Patient Active Problem List   Diagnosis Date Noted  . 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 Status post shoulder replacement 06/10/2017    Cameron Sprang, PT, MPT [facility] [address] [place], [place], [address] Phone: [phone]   Fax:  [phone] 03/14/20, 3:35 PM  Name: Khali Perella MRN: [id number] Date of Birth: [birth year]

## 2020-03-17 ENCOUNTER — Other Ambulatory Visit: Payer: Self-pay

## 2020-03-17 ENCOUNTER — Encounter: Payer: Self-pay | Admitting: Occupational Therapy

## 2020-03-17 ENCOUNTER — Ambulatory Visit: Payer: No Typology Code available for payment source

## 2020-03-17 ENCOUNTER — Ambulatory Visit: Payer: No Typology Code available for payment source | Admitting: Occupational Therapy

## 2020-03-17 DIAGNOSIS — R2689 Other abnormalities of gait and mobility: Secondary | ICD-10-CM

## 2020-03-17 DIAGNOSIS — M79642 Pain in left hand: Secondary | ICD-10-CM

## 2020-03-17 DIAGNOSIS — R208 Other disturbances of skin sensation: Secondary | ICD-10-CM

## 2020-03-17 DIAGNOSIS — M25642 Stiffness of left hand, not elsewhere classified: Secondary | ICD-10-CM

## 2020-03-17 DIAGNOSIS — R2681 Unsteadiness on feet: Secondary | ICD-10-CM

## 2020-03-17 DIAGNOSIS — G8929 Other chronic pain: Secondary | ICD-10-CM

## 2020-03-17 DIAGNOSIS — M6281 Muscle weakness (generalized): Secondary | ICD-10-CM

## 2020-03-17 DIAGNOSIS — M25522 Pain in left elbow: Secondary | ICD-10-CM

## 2020-03-17 NOTE — Patient Instructions (Signed)
Access Code: [email or web address] URL: [email or web address] Date: 03/17/2020 Prepared by: Geoffry Paradise  Exercises Supine Active Straight Leg Raise - 1-2 x daily - 5 x weekly - 1 sets - 10 reps Supine Heel Slide with Strap - 1-2 x daily - 5 x weekly - 1 sets - 10 reps Clamshell - 1-2 x daily - 5 x weekly - 1 sets - 10 reps Performed today: Standing Single Leg Stance with Counter Support - 1 x daily - 5 x weekly - 1 sets - 3 reps - 10-20 hold Feet together with head nods and head turns - 1 x daily - 5 x weekly - 1 sets - 3 reps Supine Knee Extension Strengthening - 2 x daily - 5 x weekly - 2 sets - 5 reps - 1-2 seconds hold

## 2020-03-17 NOTE — Patient Instructions (Addendum)
Flexor Tendon Gliding (Active Hook Fist)   With fingers and knuckles straight, bend middle and tip joints. Do not bend large knuckles. Repeat _10-15___ times. Do _4-___ sessions per day.  MP Flexion (Active)   With back of hand on table, bend large knuckles as far as they will go, keeping small joints straight. Repeat _10-15___ times. Do __4_ sessions per day. Use buddy strap Activity: Reach into a narrow container.*      Finger Flexion / Extension   With palm up, bend fingers of left hand toward palm, making a  fist. Straighten fingers, opening fist. Repeat sequence _10-15___ times per session. Do _4_ sessions per day. Hand Variation: Palm down   Copyright  VHI. All rights reserved.             Finger Abduction / Adduction    Spread fingers of left hand. Then bring together. Keep fingers straight. Repeat sequence __10__ times per session. Do _5__ sessions per week. Hand Variation: Palm up   Copyright  VHI. All rights reserved.     Putty-in-Your-Hand    Slowly squeeze putty or a soft rubber ball while breathing normally. Repeat with other hand. Repeat __10-20__ times. Do _1___ sessions per day.  http://gt2.exer.us/885   Copyright  VHI. All rights reserved.   opyright  VHI. All rights reserved.  Finger / Thumb Activities: Extension    Roll putty into rope shape using all fingers held straight. Hitchhike with thumb up and out.  Pinch: Palmar    Pinch putty with right thumb and each fingertip in turn. Repeat _10___ times. Do _1___ sessions per day. Activity: Peel fruit such as lemons or oranges.* Peel stickers off surfaces.  C Copyright  VHI. All rights reserved.

## 2020-03-17 NOTE — Therapy (Signed)
[facility] [address] [place], [place], [address] Phone: [phone]   Fax:  [phone]  Physical Therapy Treatment  Patient Details  Name: Katrina Hughes MRN: [id number] Date of Birth: 14-Dec-1950 Referring Provider (PT): Gomez Cleverly, MD   Encounter Date: 03/17/2020   PT End of Session - 03/17/20 1455    Visit Number 3    Number of Visits 15    Date for PT Re-Evaluation 05/09/20    Authorization Type [facility], 15 visits    Authorization Time Period 02/02/20-06/01/20    Authorization - Visit Number 3    Authorization - Number of Visits 15    PT Start Time 3500    PT Stop Time 9381    PT Time Calculation (min) 40 min    Equipment Utilized During Treatment Gait belt    Activity Tolerance Patient tolerated treatment well    Behavior During Therapy WFL for tasks assessed/performed           Past Medical History:  Diagnosis Date   Anginal pain ([place])    Bell palsy    CHF (congestive heart failure) ([facility])    Chronic kidney disease    Complication of anesthesia    slow to wake up   Diabetes mellitus without complication ([place])    Dyspnea    Environmental and seasonal allergies    GERD (gastroesophageal reflux disease)    Glaucoma    Hypertension    Hyperthyroidism    pt states that it's hypothyroidism   Myocardial infarction [facility])    Sleep apnea     Past Surgical History:  Procedure Laterality Date   ABDOMINAL HYSTERECTOMY     APPENDECTOMY     BICEPT TENODESIS Left 06/10/2017   Procedure: BICEPS TENODESIS;  Surgeon: Leim Fabry, MD;  Location: [facility];  Service: Orthopedics;  Laterality: Left;   BREAST REDUCTION SURGERY     CATARACT EXTRACTION, BILATERAL Bilateral    CHOLECYSTECTOMY     CORONARY ANGIOPLASTY WITH STENT PLACEMENT     HERNIA REPAIR     NASAL HEMORRHAGE CONTROL N/A 09/17/2017   Procedure: EPISTAXIS CONTROL;  Surgeon: [name], MD;  Location: [facility];  Service:  ENT;  Laterality: N/A;   REVERSE SHOULDER ARTHROPLASTY Left 06/10/2017   Procedure: REVERSE SHOULDER ARTHROPLASTY;  Surgeon: Leim Fabry, MD;  Location: [facility];  Service: Orthopedics;  Laterality: Left;   REVERSE SHOULDER ARTHROPLASTY Right 11/03/2018   Procedure: REVERSE SHOULDER ARTHROPLASTY, BICEPS TENODESIS;  Surgeon: Leim Fabry, MD;  Location: [facility];  Service: Orthopedics;  Laterality: Right;    There were no vitals filed for this visit.   Subjective Assessment - 03/17/20 1403    Subjective Pt denied changes or falls since last visit. No pain at rest in Hughes knee but reported Hughes knee pain is a 6/10 during amb.    Pertinent History Anginal pain, Hughes Bell palsy, CHF, CKD, DM, dyspnea, seasonal allergies, GERD, glaucoma-had surgery to repair, MI (20+ years ago per pt), hyperthyroidism (per chart but pt states it's hypo), HTN, sleep apnea    Patient Stated Goals To go to grocery store without having to ride electric scooter, improve balance to walk further, and decr. pain    Currently in Pain? No/denies              [facility] PT Assessment - 03/17/20 1405      AROM   Overall AROM  Deficits    AROM Assessment Site Hip    Right/Left Hip Right;Left  Right Hip External Rotation  20    Right Hip Internal Rotation  21    Left Hip External Rotation  23   with pain reported in Hughes knee   Left Hip Internal Rotation  17   with pain reported in Hughes knee              Therex and NMR HEP: Access Code: [email or web address] URL: [email or web address] Date: 03/17/2020 Prepared by: Geoffry Paradise  Exercises Reviewed: Supine Active Straight Leg Raise - 1-2 x daily - 5 x weekly - 1 sets - 10 reps Supine Heel Slide with Strap - 1-2 x daily - 5 x weekly - 1 sets - 10 reps Clamshell - 1-2 x daily - 5 x weekly - 1 sets - 10 reps Performed today with cues and demo and S for safety.  Standing Single Leg Stance with Counter Support - 1 x daily - 5 x weekly - 1 sets - 3 reps - 10-20 hold Feet  together with head nods and head turns - 1 x daily - 5 x weekly - 1 sets - 3 reps Supine Knee Extension Strengthening - 2 x daily - 5 x weekly - 2 sets - 5 reps - 1-2 seconds hold           [facility] Adult PT Treatment/Exercise - 03/17/20 1427      Manual Therapy   Manual Therapy Joint mobilization    Manual therapy comments B hip distraction to incr. B hip IR/ER ROM and decr. pain    Joint Mobilization 2 sets of 30-45 sec. bouts to each hip. R IR: 31 degrees, R ER: 21 degrees. Hughes ER: 21 degrees and Hughes IR: 31 degrees               Balance Exercises - 03/17/20 1452      Balance Exercises: Standing   Standing Eyes Opened Narrow base of support (BOS);Head turns;Solid surface;Other reps (comment);30 secs   2-4 reps per activity with 10 reps/head turn/nods   Standing Eyes Closed Narrow base of support (BOS);Wide (BOA);Head turns;Solid surface;4 reps;10 secs;30 secs   10 reps of head turns/nods   Tandem Stance Eyes open;Intermittent upper extremity support;2 reps;30 secs   partial tandem BLE   SLS Eyes open;Upper extremity support 1;2 reps;10 secs;20 secs   10 sec. on LLE 20 sec. on RLE   Other Standing Exercises Comments Performed with chair in front of pt and mat table behind pt with S to min guard for safety. Some activities added to HEP.             PT Education - 03/17/20 1454    Education Details PT added to HEP and educated pt on B hip joint mobilization to improve ROM and decr. pain. PT again reiterated the importance of getting ortho MD in [place].    Person(s) Educated Patient    Methods Explanation;Demonstration;Verbal cues;Tactile cues;Handout    Comprehension Returned demonstration;Verbalized understanding            PT Short Term Goals - 03/14/20 1530      PT SHORT TERM GOAL #1   Title Pt will be IND with HEP to improve pain, balance, flexibility and strength. TARGET DATE FOR ALL STGS: 04/07/20    Time 4    Period Weeks    Status New      PT SHORT TERM  GOAL #2   Title Perform BERG and write goals as indicated.    Time 4    Period Weeks  Status Achieved      PT SHORT TERM GOAL #3   Title Pt will improve gait speed with LRAD to >/=1.11ft/sec. to decr. falls risk.    Time 4    Period Weeks    Status New      PT SHORT TERM GOAL #4   Title Pt will report knee pain decr. to 7/10 at worst, on average to improve functional mobility.    Time 4    Period Weeks    Status New      PT SHORT TERM GOAL #5   Title Pt will amb. 300' at MOD I level with LRAD, with <7/10 knee pan to improve functional mobility.    Time 4    Period Weeks    Status New      Additional Short Term Goals   Additional Short Term Goals Yes      PT SHORT TERM GOAL #6   Title Pt will improve BERG balance score to >/=49/56 in order to indicate dec fall risk.    Time 4    Period Weeks    Status New             PT Long Term Goals - 03/14/20 1531      PT LONG TERM GOAL #1   Title Pt will report no falls in last four weeks to improve safety. TARGET DATE FOR ALL LTGS: 05/02/20    Time 8    Period Weeks    Status New      PT LONG TERM GOAL #2   Title Pt will amb. 500' in/outdoors at MOD I level with LRAD with </=5/10 knee pain to improve functional mobility.    Time 8    Period Weeks    Status New      PT LONG TERM GOAL #3   Title Pt will be able to attempt bowling in order to improve QOL.    Time 8    Period Weeks    Status New      PT LONG TERM GOAL #4   Title Pt will report ability to grocery shop with LRAD and no LOB or incr. pain to perform IADLs.    Time 8    Period Weeks    Status New      PT LONG TERM GOAL #5   Title Pt will score >/=53/56 on BERG balance (and perform DGI/FGA as able) in order to indicate dec fall risk.    Time 8    Period Weeks    Status New                 Plan - 03/17/20 1405    Clinical Impression Statement Pt's B hip IR/ER AROM limited with AROM improving after B hip joint mobilizations. Pt demonstrating  progress as she reported no pain at rest today and was able to perform all activities with less rest breaks. Pt required moderate cues for proper technique during R SAQs 2/2 with pt reporting decr. pain at end of exercise. Pt would continue to benefit from skilled PT to improve safety during functional mobility and to decr. pain.    Personal Factors and Comorbidities Age;Fitness;Comorbidity 3+    Comorbidities Anginal pain, Hughes Bell palsy, CHF, CKD, DM, dyspnea, seasonal allergies, GERD, glaucoma-had surgery to repair, MI (20+ years ago per pt), hyperthyroidism (per chart but pt states it's hypo), HTN, sleep apnea    Examination-Activity Limitations Bed Mobility;Bend;Carry;Continence;Toileting;Stand;Stairs;Squat;Locomotion Level;Transfers    Examination-Participation Restrictions Driving;Meal Prep;Interpersonal Relationship;Laundry  Stability/Clinical Decision Making Evolving/Moderate complexity    Rehab Potential Good    PT Frequency --   2x/week for 6 weeks and then 1x/week for 2 weeks based on [facility] visit limit of 15.   PT Duration --   2x/week for 6 weeks and then 1x/week for 2 weeks based on VA visit limit of 15.   PT Treatment/Interventions ADLs/Self Care Home Management;Aquatic Therapy;Biofeedback;Canalith Repostioning;DME Instruction;Balance training;Therapeutic exercise;Therapeutic activities;Functional mobility training;Stair training;Gait training;Neuromuscular re-education;Patient/family education;Orthotic Fit/Training;Manual techniques;Vestibular    PT Next Visit Plan add more strength/flexibility.  Maybe gentle strength/ROM with nustep/stepper? Did she get in touch with [facility] to set up an ortho MD appt.--waiting for [facility] to approve ortho per pt    PT [place]: [email or web address]    Consulted and Agree with Plan of Care Patient           Patient will benefit from skilled therapeutic intervention in order to improve the following deficits and impairments:  Abnormal gait,Decreased  range of motion,Difficulty walking,Dizziness,Decreased knowledge of precautions,Decreased endurance,Decreased balance,Decreased knowledge of use of DME,Decreased mobility,Decreased strength,Increased edema,Postural dysfunction,Impaired flexibility,Pain  Visit Diagnosis: Other abnormalities of gait and mobility  Chronic pain of both knees  Unsteadiness on feet  Muscle weakness (generalized)     Problem List Patient Active Problem List   Diagnosis Date Noted   S/p reverse total shoulder arthroplasty 11/03/2018   Status post shoulder replacement 06/10/2017    Katrina Hughes 03/17/2020, 3:01 PM  [facility] [address] [place], [place], [address] Phone: [phone]   Fax:  [phone]  Name: Katrina Hughes MRN: [id number] Date of Birth: [birth year]  Geoffry Paradise, PT,DPT 03/17/20 3:02 PM Phone: [phone] Fax: [phone]

## 2020-03-17 NOTE — Therapy (Signed)
[facility] [address] [place], [place], [address] Phone: [phone]   Fax:  [phone]  Occupational Therapy Treatment  Patient Details  Name: Katrina Hughes MRN: [id number] Date of Birth: 03-29-1951 No data recorded  Encounter Date: 03/17/2020   OT End of Session - 03/17/20 1524    Visit Number 2    Number of Visits 15    Date for OT Re-Evaluation 04/28/20    Authorization Type VA - Approved 15 visits (already used 1 at the [place] for O.T. )    Authorization Time Period 02/02/20 - 06/01/20    Authorization - Visit Number 3    Authorization - Number of Visits 15    OT Start Time 1450    OT Stop Time 1530    OT Time Calculation (min) 40 min    Behavior During Therapy [facility] for tasks assessed/performed           Past Medical History:  Diagnosis Date  . Anginal pain ([place])   . Bell palsy   . CHF (congestive heart failure) ([place])   . Chronic kidney disease   . Complication of anesthesia    slow to wake up  . Diabetes mellitus without complication ([place])   . Dyspnea   . Environmental and seasonal allergies   . GERD (gastroesophageal reflux disease)   . Glaucoma   . Hypertension   . Hyperthyroidism    pt states that it's hypothyroidism  . Myocardial infarction ([place])   . Sleep apnea     Past Surgical History:  Procedure Laterality Date  . ABDOMINAL HYSTERECTOMY    . APPENDECTOMY    . BICEPT TENODESIS Left 06/10/2017   Procedure: BICEPS TENODESIS;  Surgeon: Leim Fabry, MD;  Location: [facility];  Service: Orthopedics;  Laterality: Left;  . BREAST REDUCTION SURGERY    . CATARACT EXTRACTION, BILATERAL Bilateral   . CHOLECYSTECTOMY    . CORONARY ANGIOPLASTY WITH STENT PLACEMENT    . HERNIA REPAIR    . NASAL HEMORRHAGE CONTROL N/A 09/17/2017   Procedure: EPISTAXIS CONTROL;  Surgeon: [name], MD;  Location: [facility];  Service: ENT;  Laterality: N/A;  . REVERSE SHOULDER ARTHROPLASTY Left 06/10/2017   Procedure:  REVERSE SHOULDER ARTHROPLASTY;  Surgeon: Leim Fabry, MD;  Location: [facility];  Service: Orthopedics;  Laterality: Left;  . REVERSE SHOULDER ARTHROPLASTY Right 11/03/2018   Procedure: REVERSE SHOULDER ARTHROPLASTY, BICEPS TENODESIS;  Surgeon: Leim Fabry, MD;  Location: [facility];  Service: Orthopedics;  Laterality: Right;    There were no vitals filed for this visit.   Subjective Assessment - 03/17/20 1506    Subjective  Pt reports finger pain    Pertinent History displaced fx of proximal phalanx Lt small finger after fall on 12/10/19    Currently in Pain? Yes    Pain Score 8     Pain Location Finger (Comment which one)    Pain Orientation Right    Pain Descriptors / Indicators Aching    Pain Type Chronic pain    Pain Onset More than a month ago    Pain Frequency Intermittent    Aggravating Factors  movement    Pain Relieving Factors heat    Pain Score 2    Pain Location Elbow    Pain Orientation Left    Pain Descriptors / Indicators Aching    Pain Type Chronic pain    Pain Onset More than a month ago    Pain Frequency Intermittent    Aggravating  Factors  unknown    Pain Relieving Factors ice                   Treatment: Fluidotherapy x 10 mins to LUE for pain and stiffness, no adverse reactions. Pt was instructed in initial HEP, 10-20 reps each, gentle P/ROM MP and composite flexion reverse blocking exercises. Ice pack x 5 mins to LUE at end of session, no adverse reactions.             OT Education - 03/17/20 1527    Education Details inital HEP, yellow putty ex    Person(s) Educated Patient    Methods Explanation;Verbal cues;Handout    Comprehension Verbalized understanding;Returned demonstration            OT Short Term Goals - 03/10/20 1550      OT SHORT TERM GOAL #1   Title Independent with initial HEP    Time 3    Period Weeks    Status New      OT SHORT TERM GOAL #2   Title Pt to demo 75% composite finger flexion in prep for better  grasping    Baseline 40%    Time 3    Period Weeks    Status New      OT SHORT TERM GOAL #3   Title Pt to report decreased numbness and pain along ulnar n. distribution and at medial elbow w/ wrist flexion    Time 3    Period Weeks    Status New      OT SHORT TERM GOAL #4   Title Pt to improve grip strength Lt hand to 15 lbs    Baseline 9 lbs    Time 3    Period Weeks    Status New             OT Long Term Goals - 03/10/20 1552      OT LONG TERM GOAL #1   Title Independent with updated HEP    Time 7    Period Weeks    Status New      OT LONG TERM GOAL #2   Title Pt to demo 90% or greater full composite flexion Lt hand    Time 7    Period Weeks    Status New      OT LONG TERM GOAL #3   Title Pt to demo 20 lbs or greater grip strength Lt hand    Baseline 9 lbs    Time 7    Period Weeks    Status New      OT LONG TERM GOAL #4   Title Pt to consistently use Lt hand as assist for bilateral tasks    Time 7    Period Weeks    Status New                 Plan - 03/17/20 1530    Clinical Impression Statement Pt is progressing towards goals. She demonstrates understanding of inital  HEP.    OT Occupational Profile and History Detailed Assessment- Review of Records and additional review of physical, cognitive, psychosocial history related to current functional performance    Occupational performance deficits (Please refer to evaluation for details): IADL's    Body Structure / Function / Physical Skills Strength;Pain;Dexterity;Edema;UE functional use;IADL;ROM;Flexibility;Sensation;Coordination    Rehab Potential Good    Clinical Decision Making Several treatment options, min-mod task modification necessary    Comorbidities Affecting Occupational Performance: May have comorbidities impacting  occupational performance    Modification or Assistance to Complete Evaluation  No modification of tasks or assist necessary to complete eval    OT Frequency 2x / week    OT  Duration --   for 7 weeks   OT Treatment/Interventions Self-care/ADL training;Moist Heat;Fluidtherapy;DME and/or AE instruction;Splinting;Therapeutic activities;Ultrasound;Therapeutic exercise;Cryotherapy;Neuromuscular education;Functional Mobility Training;Passive range of motion;Electrical Stimulation;Paraffin;Manual Therapy;Patient/family education    Plan reveiw inital HEP for ROM and yellow putty, consider following ex's/protocol from medial epicondylitis program if elbow pain perists    Consulted and Agree with Plan of Care Patient    Family Member Consulted wife           Patient will benefit from skilled therapeutic intervention in order to improve the following deficits and impairments:   Body Structure / Function / Physical Skills: Strength,Pain,Dexterity,Edema,UE functional use,IADL,ROM,Flexibility,Sensation,Coordination       Visit Diagnosis: Stiffness of left hand, not elsewhere classified  Pain in left hand  Other disturbances of skin sensation  Pain in left elbow    Problem List Patient Active Problem List   Diagnosis Date Noted  . 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 Status post shoulder replacement 06/10/2017    Latresa Gasser 03/17/2020, 3:50 PM  [facility] [address] [place], [place], [address] Phone: [phone]   Fax:  [phone]  Name: Aradhana Gin MRN: [id number] Date of Birth: February 06, 1951

## 2020-03-21 ENCOUNTER — Ambulatory Visit: Payer: No Typology Code available for payment source | Admitting: Physical Therapy

## 2020-03-21 ENCOUNTER — Other Ambulatory Visit: Payer: Self-pay

## 2020-03-21 ENCOUNTER — Ambulatory Visit: Payer: No Typology Code available for payment source | Admitting: Occupational Therapy

## 2020-03-21 DIAGNOSIS — M6281 Muscle weakness (generalized): Secondary | ICD-10-CM

## 2020-03-21 DIAGNOSIS — R2681 Unsteadiness on feet: Secondary | ICD-10-CM

## 2020-03-21 DIAGNOSIS — M25642 Stiffness of left hand, not elsewhere classified: Secondary | ICD-10-CM

## 2020-03-21 DIAGNOSIS — M79642 Pain in left hand: Secondary | ICD-10-CM | POA: Diagnosis not present

## 2020-03-21 DIAGNOSIS — R208 Other disturbances of skin sensation: Secondary | ICD-10-CM

## 2020-03-21 DIAGNOSIS — R2689 Other abnormalities of gait and mobility: Secondary | ICD-10-CM

## 2020-03-21 NOTE — Therapy (Signed)
[facility] [address] [place], [place], [address] Phone: [phone]   Fax:  [phone]  Physical Therapy Treatment  Patient Details  Name: Katrina Hughes MRN: [id number] Date of Birth: 05-21-1950 Referring Provider (PT): Gomez Cleverly, MD   Encounter Date: 03/21/2020   PT End of Session - 03/21/20 1456    Visit Number 4    Number of Visits 15    Date for PT Re-Evaluation 05/09/20    Authorization Type VA, 15 visits    Authorization Time Period 02/02/20-06/01/20    Authorization - Visit Number 4    Authorization - Number of Visits 15    PT Start Time 9702   Had to use restroom at beginning of session   PT Stop Time 1446    PT Time Calculation (min) 40 min    Equipment Utilized During Treatment Gait belt    Activity Tolerance Patient tolerated treatment well;No increased pain   Pt reports pain 7/10>5/10 at end of session   Behavior During Therapy [facility] for tasks assessed/performed           Past Medical History:  Diagnosis Date  . Anginal pain ([place])   . Bell palsy   . CHF (congestive heart failure) ([place])   . Chronic kidney disease   . Complication of anesthesia    slow to wake up  . Diabetes mellitus without complication ([place])   . Dyspnea   . Environmental and seasonal allergies   . GERD (gastroesophageal reflux disease)   . Glaucoma   . Hypertension   . Hyperthyroidism    pt states that it's hypothyroidism  . Myocardial infarction ([place])   . Sleep apnea     Past Surgical History:  Procedure Laterality Date  . ABDOMINAL HYSTERECTOMY    . APPENDECTOMY    . BICEPT TENODESIS Left 06/10/2017   Procedure: BICEPS TENODESIS;  Surgeon: Leim Fabry, MD;  Location: [facility];  Service: Orthopedics;  Laterality: Left;  . BREAST REDUCTION SURGERY    . CATARACT EXTRACTION, BILATERAL Bilateral   . CHOLECYSTECTOMY    . CORONARY ANGIOPLASTY WITH STENT PLACEMENT    . HERNIA REPAIR    . NASAL HEMORRHAGE  CONTROL N/A 09/17/2017   Procedure: EPISTAXIS CONTROL;  Surgeon: [name], MD;  Location: [facility];  Service: ENT;  Laterality: N/A;  . REVERSE SHOULDER ARTHROPLASTY Left 06/10/2017   Procedure: REVERSE SHOULDER ARTHROPLASTY;  Surgeon: Leim Fabry, MD;  Location: [facility];  Service: Orthopedics;  Laterality: Left;  . REVERSE SHOULDER ARTHROPLASTY Right 11/03/2018   Procedure: REVERSE SHOULDER ARTHROPLASTY, BICEPS TENODESIS;  Surgeon: Leim Fabry, MD;  Location: [facility];  Service: Orthopedics;  Laterality: Right;    There were no vitals filed for this visit.   Subjective Assessment - 03/21/20 1405    Subjective No changes since last visit.  Spoke to someone at [place] who is going to get back to me about orthopedic MD.    Pertinent History Anginal pain, L Bell palsy, CHF, CKD, DM, dyspnea, seasonal allergies, GERD, glaucoma-had surgery to repair, MI (20+ years ago per pt), hyperthyroidism (per chart but pt states it's hypo), HTN, sleep apnea    Patient Stated Goals To go to grocery store without having to ride electric scooter, improve balance to walk further, and decr. pain    Currently in Pain? Yes    Pain Score 7     Pain Location Knee    Pain Orientation Left    Pain Descriptors / Indicators Dull  Pain Type Chronic pain    Pain Onset More than a month ago    Pain Frequency Intermittent    Aggravating Factors  walking    Pain Relieving Factors cream from the [facility]/Exercise - 03/21/20 0001      Exercises   Exercises Knee/Hip      Knee/Hip Exercises: Aerobic   Nustep NuStep, Level 2, 4 extremities x 5 minutes for lower extremity flexibility and strengthening.  Last 30 seconds, pt able to use BLEs only.      Knee/Hip Exercises: Seated   Other Seated Knee/Hip Exercises Seated forward/back motion with feet on pool noodle, x 15 reps for gentle active ROM in knee flexion/extension    Other Seated Knee/Hip Exercises  Seated heel digs x 5 reps for hamstring strengthening      Knee/Hip Exercises: Supine   Quad Sets Both;1 set;10 reps    Quad Sets Limitations knees propped on rolled pillow    Other Supine Knee/Hip Exercises Glut sets, bilateral, 10 reps, 3 sec hold.    Other Supine Knee/Hip Exercises Attempted hooklying position L heel digs x 5 reps for hamstring activation.               Balance Exercises - 03/21/20 0001      Balance Exercises: Standing   Tandem Stance Eyes open;Intermittent upper extremity support;2 reps;30 secs;Limitations    Tandem Stance Time Partial tandem stance; 2nd rep with head turns during the 30 seconds.    SLS with Vectors Solid surface;Upper extremity assist 2;Other reps (comment);Limitations    SLS with Vectors Limitations alt step taps to 4" block with BUE support at chair for improved SLS    Sidestepping 2 reps   R and L along counter 10 ft, no UE support         Reviewed HEP additions from last visit, with pt return demo understanding:  Standing Single Leg Stance with Counter Support - 1 x daily - 5 x weekly - 1 sets - 3 reps - 10-20 hold (definite need for UE support)  Feet together with head nods and head turns - 1 x daily - 5 x weekly - 1 sets - 3 reps (intermittent UE support) Supine Knee Extension Strengthening - 2 x daily - 5 x weekly - 2 sets - 5 reps - 1-2 seconds hold  (Therapeutic Exercise)     PT Short Term Goals - 03/14/20 1530      PT SHORT TERM GOAL #1   Title Pt will be IND with HEP to improve pain, balance, flexibility and strength. TARGET DATE FOR ALL STGS: 04/07/20    Time 4    Period Weeks    Status New      PT SHORT TERM GOAL #2   Title Perform BERG and write goals as indicated.    Time 4    Period Weeks    Status Achieved      PT SHORT TERM GOAL #3   Title Pt will improve gait speed with LRAD to >/=1.32ft/sec. to decr. falls risk.    Time 4    Period Weeks    Status New      PT SHORT TERM GOAL #4   Title Pt will report  knee pain decr. to 7/10 at worst, on average to improve functional mobility.  Time 4    Period Weeks    Status New      PT SHORT TERM GOAL #5   Title Pt will amb. 300' at MOD I level with LRAD, with <7/10 knee pan to improve functional mobility.    Time 4    Period Weeks    Status New      Additional Short Term Goals   Additional Short Term Goals Yes      PT SHORT TERM GOAL #6   Title Pt will improve BERG balance score to >/=49/56 in order to indicate dec fall risk.    Time 4    Period Weeks    Status New             PT Long Term Goals - 03/14/20 1531      PT LONG TERM GOAL #1   Title Pt will report no falls in last four weeks to improve safety. TARGET DATE FOR ALL LTGS: 05/02/20    Time 8    Period Weeks    Status New      PT LONG TERM GOAL #2   Title Pt will amb. 500' in/outdoors at MOD I level with LRAD with </=5/10 knee pain to improve functional mobility.    Time 8    Period Weeks    Status New      PT LONG TERM GOAL #3   Title Pt will be able to attempt bowling in order to improve QOL.    Time 8    Period Weeks    Status New      PT LONG TERM GOAL #4   Title Pt will report ability to grocery shop with LRAD and no LOB or incr. pain to perform IADLs.    Time 8    Period Weeks    Status New      PT LONG TERM GOAL #5   Title Pt will score >/=53/56 on BERG balance (and perform DGI/FGA as able) in order to indicate dec fall risk.    Time 8    Period Weeks    Status New                 Plan - 03/21/20 1458    Clinical Impression Statement Pt able to begin session with use of NuStep today for improved gentl flexibility and strengthening; pt reports L knee aches after use of NuStep, but she appears to have improved ease of movement with short distance gait.  Performed seated, supine exercises for leg flexibility and strength around knee, reviewed HEP, and performed standing balance exercises.  At end of session, she rates pain 2 points lower (5/10) on  pain scale and has improved ease of movement with gait out of session.    Personal Factors and Comorbidities Age;Fitness;Comorbidity 3+    Comorbidities Anginal pain, L Bell palsy, CHF, CKD, DM, dyspnea, seasonal allergies, GERD, glaucoma-had surgery to repair, MI (20+ years ago per pt), hyperthyroidism (per chart but pt states it's hypo), HTN, sleep apnea    Examination-Activity Limitations Bed Mobility;Bend;Carry;Continence;Toileting;Stand;Stairs;Squat;Locomotion Level;Transfers    Examination-Participation Restrictions Driving;Meal Prep;Interpersonal Relationship;Laundry    Stability/Clinical Decision Making Evolving/Moderate complexity    Rehab Potential Good    PT Frequency --   2x/week for 6 weeks and then 1x/week for 2 weeks based on VA visit limit of 15.   PT Duration --   2x/week for 6 weeks and then 1x/week for 2 weeks based on VA visit limit of 15.   PT  Treatment/Interventions ADLs/Self Care Home Management;Aquatic Therapy;Biofeedback;Canalith Repostioning;DME Instruction;Balance training;Therapeutic exercise;Therapeutic activities;Functional mobility training;Stair training;Gait training;Neuromuscular re-education;Patient/family education;Orthotic Fit/Training;Manual techniques;Vestibular    PT Next Visit Plan add more strength/flexibility.  Continue gentle strength/ROM with nustep/stepper; Did she get in touch with [facility] to set up an ortho MD appt.--waiting for VA to approve ortho per pt    PT Home Exercise Plan [employer]: [id number]    Consulted and Agree with Plan of Care Patient           Patient will benefit from skilled therapeutic intervention in order to improve the following deficits and impairments:  Abnormal gait,Decreased range of motion,Difficulty walking,Dizziness,Decreased knowledge of precautions,Decreased endurance,Decreased balance,Decreased knowledge of use of DME,Decreased mobility,Decreased strength,Increased edema,Postural dysfunction,Impaired flexibility,Pain  Visit  Diagnosis: Muscle weakness (generalized)  Unsteadiness on feet  Other abnormalities of gait and mobility     Problem List Patient Active Problem List   Diagnosis Date Noted  . 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 Status post shoulder replacement 06/10/2017    Zamorah Ailes W. 03/21/2020, 3:01 PM   Frazier Butt., PT   [facility] [address] [place], [place], [address] Phone: [phone]   Fax:  [phone]  Name: Katrina Hughes MRN: [id number] Date of Birth: 1951-02-27

## 2020-03-21 NOTE — Therapy (Signed)
[facility] [address] [place], [place], [address] Phone: [phone]   Fax:  [phone]  Occupational Therapy Treatment  Patient Details  Name: Katrina Hughes MRN: [id number] Date of Birth: [birth year] No data recorded  Encounter Date: 03/21/2020   OT End of Session - 03/21/20 1413    Visit Number 3    Number of Visits 15    Date for OT Re-Evaluation 04/28/20    Authorization Type VA - Approved 15 visits (already used 1 at the [place] for O.T. )    Authorization Time Period 02/02/20 - 06/01/20    Authorization - Visit Number 4    Authorization - Number of Visits 15    OT Start Time 1320    OT Stop Time 1400    OT Time Calculation (min) 40 min    Activity Tolerance Patient tolerated treatment well    Behavior During Therapy [facility] for tasks assessed/performed           Past Medical History:  Diagnosis Date  . Anginal pain ([place])   . Bell palsy   . CHF (congestive heart failure) ([place])   . Chronic kidney disease   . Complication of anesthesia    slow to wake up  . Diabetes mellitus without complication ([place])   . Dyspnea   . Environmental and seasonal allergies   . GERD (gastroesophageal reflux disease)   . Glaucoma   . Hypertension   . Hyperthyroidism    pt states that it's hypothyroidism  . Myocardial infarction ([place])   . Sleep apnea     Past Surgical History:  Procedure Laterality Date  . ABDOMINAL HYSTERECTOMY    . APPENDECTOMY    . BICEPT TENODESIS Left 06/10/2017   Procedure: BICEPS TENODESIS;  Surgeon: Leim Fabry, MD;  Location: [facility];  Service: Orthopedics;  Laterality: Left;  . BREAST REDUCTION SURGERY    . CATARACT EXTRACTION, BILATERAL Bilateral   . CHOLECYSTECTOMY    . CORONARY ANGIOPLASTY WITH STENT PLACEMENT    . HERNIA REPAIR    . NASAL HEMORRHAGE CONTROL N/A 09/17/2017   Procedure: EPISTAXIS CONTROL;  Surgeon: [name], MD;  Location: [facility];  Service: ENT;  Laterality: N/A;  .  REVERSE SHOULDER ARTHROPLASTY Left 06/10/2017   Procedure: REVERSE SHOULDER ARTHROPLASTY;  Surgeon: Leim Fabry, MD;  Location: [facility];  Service: Orthopedics;  Laterality: Left;  . REVERSE SHOULDER ARTHROPLASTY Right 11/03/2018   Procedure: REVERSE SHOULDER ARTHROPLASTY, BICEPS TENODESIS;  Surgeon: Leim Fabry, MD;  Location: [facility];  Service: Orthopedics;  Laterality: Right;    There were no vitals filed for this visit.   Subjective Assessment - 03/21/20 1326    Subjective  The putty helped    Pertinent History displaced fx of proximal phalanx Lt small finger after fall on 12/10/19    Currently in Pain? Yes    Pain Score 6     Pain Location Finger (Comment which one)   5th finger   Pain Orientation Left    Pain Descriptors / Indicators Dull    Pain Onset More than a month ago    Pain Frequency Intermittent    Aggravating Factors  nothing    Pain Relieving Factors heat, ice    Pain Onset More than a month ago           Fluidotherapy x 10 minutes Lt hand to decrease stiffness and pain.  Reviewed previously issued HEP - pt required further cueing to perform correctly - especially with MP  flexion (intrinsic +), thumb adduction, and pinch strength with putty.  Reviewed proper donning/doffing of buddy straps and recommended wearing for exercises 1-3. Issued buddy strap to wear for ex's                       OT Short Term Goals - 03/10/20 1550      OT SHORT TERM GOAL #1   Title Independent with initial HEP    Time 3    Period Weeks    Status New      OT SHORT TERM GOAL #2   Title Pt to demo 75% composite finger flexion in prep for better grasping    Baseline 40%    Time 3    Period Weeks    Status New      OT SHORT TERM GOAL #3   Title Pt to report decreased numbness and pain along ulnar n. distribution and at medial elbow w/ wrist flexion    Time 3    Period Weeks    Status New      OT SHORT TERM GOAL #4   Title Pt to improve grip strength Lt hand  to 15 lbs    Baseline 9 lbs    Time 3    Period Weeks    Status New             OT Long Term Goals - 03/10/20 1552      OT LONG TERM GOAL #1   Title Independent with updated HEP    Time 7    Period Weeks    Status New      OT LONG TERM GOAL #2   Title Pt to demo 90% or greater full composite flexion Lt hand    Time 7    Period Weeks    Status New      OT LONG TERM GOAL #3   Title Pt to demo 20 lbs or greater grip strength Lt hand    Baseline 9 lbs    Time 7    Period Weeks    Status New      OT LONG TERM GOAL #4   Title Pt to consistently use Lt hand as assist for bilateral tasks    Time 7    Period Weeks    Status New                 Plan - 03/21/20 1414    Clinical Impression Statement Pt required review of HEP to ensure proper technique    OT Occupational Profile and History Detailed Assessment- Review of Records and additional review of physical, cognitive, psychosocial history related to current functional performance    Occupational performance deficits (Please refer to evaluation for details): IADL's    Body Structure / Function / Physical Skills Strength;Pain;Dexterity;Edema;UE functional use;IADL;ROM;Flexibility;Sensation;Coordination    Rehab Potential Good    Clinical Decision Making Several treatment options, min-mod task modification necessary    Comorbidities Affecting Occupational Performance: May have comorbidities impacting occupational performance    Modification or Assistance to Complete Evaluation  No modification of tasks or assist necessary to complete eval    OT Frequency 2x / week    OT Duration --   for 7 weeks   OT Treatment/Interventions Self-care/ADL training;Moist Heat;Fluidtherapy;DME and/or AE instruction;Splinting;Therapeutic activities;Ultrasound;Therapeutic exercise;Cryotherapy;Neuromuscular education;Functional Mobility Training;Passive range of motion;Electrical Stimulation;Paraffin;Manual Therapy;Patient/family education     Plan fabricate pm splint to place MP joints of 4th and 5th fingers in flexion for  greater PIP extension (following session: pulsed [place] over proximal medial elbow, follow some treatments from medial epicondylitis program)    Consulted and Agree with Plan of Care Patient    Family Member Consulted wife           Patient will benefit from skilled therapeutic intervention in order to improve the following deficits and impairments:   Body Structure / Function / Physical Skills: Strength,Pain,Dexterity,Edema,UE functional use,IADL,ROM,Flexibility,Sensation,Coordination       Visit Diagnosis: Stiffness of left hand, not elsewhere classified  Pain in left hand  Other disturbances of skin sensation    Problem List Patient Active Problem List   Diagnosis Date Noted  . 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 Status post shoulder replacement 06/10/2017    Carey Bullocks, OTR/L 03/21/2020, 2:20 PM  [facility] [address] [place], [place], [address] Phone: [phone]   Fax:  [phone]  Name: Katrina Hughes MRN: [id number] Date of Birth: 1950/08/29

## 2020-03-24 ENCOUNTER — Other Ambulatory Visit: Payer: Self-pay

## 2020-03-24 ENCOUNTER — Ambulatory Visit: Payer: No Typology Code available for payment source

## 2020-03-24 ENCOUNTER — Ambulatory Visit: Payer: No Typology Code available for payment source | Admitting: Occupational Therapy

## 2020-03-24 DIAGNOSIS — M6281 Muscle weakness (generalized): Secondary | ICD-10-CM

## 2020-03-24 DIAGNOSIS — M79642 Pain in left hand: Secondary | ICD-10-CM | POA: Diagnosis not present

## 2020-03-24 DIAGNOSIS — R2689 Other abnormalities of gait and mobility: Secondary | ICD-10-CM

## 2020-03-24 DIAGNOSIS — R2681 Unsteadiness on feet: Secondary | ICD-10-CM

## 2020-03-24 DIAGNOSIS — M25642 Stiffness of left hand, not elsewhere classified: Secondary | ICD-10-CM

## 2020-03-24 NOTE — Therapy (Signed)
[facility] [address] [place], [place], [address] Phone: [phone]   Fax:  [phone]  Occupational Therapy Treatment  Patient Details  Name: Katrina Hughes MRN: [id number] Date of Birth: 07/26/1950 No data recorded  Encounter Date: 03/24/2020   OT End of Session - 03/24/20 1303    Visit Number 4    Number of Visits 15    Date for OT Re-Evaluation 04/28/20    Authorization Type VA - Approved 15 visits (already used 1 at the [place] for O.T. )    Authorization Time Period 02/02/20 - 06/01/20    Authorization - Visit Number 5    Authorization - Number of Visits 15    OT Start Time 1150    OT Stop Time 1230    OT Time Calculation (min) 40 min    Activity Tolerance Patient tolerated treatment well    Behavior During Therapy [facility] for tasks assessed/performed           Past Medical History:  Diagnosis Date  . Anginal pain ([place])   . Bell palsy   . CHF (congestive heart failure) ([place])   . Chronic kidney disease   . Complication of anesthesia    slow to wake up  . Diabetes mellitus without complication ([place])   . Dyspnea   . Environmental and seasonal allergies   . GERD (gastroesophageal reflux disease)   . Glaucoma   . Hypertension   . Hyperthyroidism    pt states that it's hypothyroidism  . Myocardial infarction ([place])   . Sleep apnea     Past Surgical History:  Procedure Laterality Date  . ABDOMINAL HYSTERECTOMY    . APPENDECTOMY    . BICEPT TENODESIS Left 06/10/2017   Procedure: BICEPS TENODESIS;  Surgeon: Leim Fabry, MD;  Location: [facility];  Service: Orthopedics;  Laterality: Left;  . BREAST REDUCTION SURGERY    . CATARACT EXTRACTION, BILATERAL Bilateral   . CHOLECYSTECTOMY    . CORONARY ANGIOPLASTY WITH STENT PLACEMENT    . HERNIA REPAIR    . NASAL HEMORRHAGE CONTROL N/A 09/17/2017   Procedure: EPISTAXIS CONTROL;  Surgeon: [name], MD;  Location: [facility];  Service: ENT;  Laterality: N/A;  .  REVERSE SHOULDER ARTHROPLASTY Left 06/10/2017   Procedure: REVERSE SHOULDER ARTHROPLASTY;  Surgeon: Leim Fabry, MD;  Location: [facility];  Service: Orthopedics;  Laterality: Left;  . REVERSE SHOULDER ARTHROPLASTY Right 11/03/2018   Procedure: REVERSE SHOULDER ARTHROPLASTY, BICEPS TENODESIS;  Surgeon: Leim Fabry, MD;  Location: [facility];  Service: Orthopedics;  Laterality: Right;    There were no vitals filed for this visit.   Subjective Assessment - 03/24/20 1150    Pertinent History displaced fx of proximal phalanx Lt small finger after fall on 12/10/19    Currently in Pain? Yes    Pain Score 7     Pain Location Knee    Pain Orientation Left    Pain Descriptors / Indicators Aching    Pain Onset More than a month ago    Pain Frequency Intermittent    Aggravating Factors  walking, exercise    Pain Relieving Factors cream, rest, stretches    Pain Onset More than a month ago          Fabricated and fitted hand based splint for pm wear to place 4th/5th digits in slight MP flexion with PIP joint extended. Pt educated in wear and care and issued splint.  OT Education - 03/24/20 1230    Education Details splint wear and care    Person(s) Educated Patient    Methods Explanation;Verbal cues;Handout    Comprehension Verbalized understanding;Returned demonstration            OT Short Term Goals - 03/24/20 1304      OT SHORT TERM GOAL #1   Title Independent with initial HEP    Time 3    Period Weeks    Status Achieved      OT SHORT TERM GOAL #2   Title Pt to demo 75% composite finger flexion in prep for better grasping    Baseline 40%    Time 3    Period Weeks    Status On-going      OT SHORT TERM GOAL #3   Title Pt to report decreased numbness and pain along ulnar n. distribution and at medial elbow w/ wrist flexion    Time 3    Period Weeks    Status New      OT SHORT TERM GOAL #4   Title Pt to improve grip strength Lt hand to 15  lbs    Baseline 9 lbs    Time 3    Period Weeks    Status On-going             OT Long Term Goals - 03/10/20 1552      OT LONG TERM GOAL #1   Title Independent with updated HEP    Time 7    Period Weeks    Status New      OT LONG TERM GOAL #2   Title Pt to demo 90% or greater full composite flexion Lt hand    Time 7    Period Weeks    Status New      OT LONG TERM GOAL #3   Title Pt to demo 20 lbs or greater grip strength Lt hand    Baseline 9 lbs    Time 7    Period Weeks    Status New      OT LONG TERM GOAL #4   Title Pt to consistently use Lt hand as assist for bilateral tasks    Time 7    Period Weeks    Status New                 Plan - 03/24/20 1304    Clinical Impression Statement Pt progressing towards STG's. Pt with no pain in Lt hand today    OT Occupational Profile and History Detailed Assessment- Review of Records and additional review of physical, cognitive, psychosocial history related to current functional performance    Occupational performance deficits (Please refer to evaluation for details): IADL's    Body Structure / Function / Physical Skills Strength;Pain;Dexterity;Edema;UE functional use;IADL;ROM;Flexibility;Sensation;Coordination    Rehab Potential Good    Clinical Decision Making Several treatment options, min-mod task modification necessary    Comorbidities Affecting Occupational Performance: May have comorbidities impacting occupational performance    Modification or Assistance to Complete Evaluation  No modification of tasks or assist necessary to complete eval    OT Frequency 2x / week    OT Duration --   for 7 weeks   OT Treatment/Interventions Self-care/ADL training;Moist Heat;Fluidtherapy;DME and/or AE instruction;Splinting;Therapeutic activities;Ultrasound;Therapeutic exercise;Cryotherapy;Neuromuscular education;Functional Mobility Training;Passive range of motion;Electrical Stimulation;Paraffin;Manual Therapy;Patient/family  education    Plan pulsed [place] over proximal medial elbow, follow some treatments from medial epicondylitis program prn    Consulted and Agree  with Plan of Care Patient    Family Member Consulted wife           Patient will benefit from skilled therapeutic intervention in order to improve the following deficits and impairments:   Body Structure / Function / Physical Skills: Strength,Pain,Dexterity,Edema,UE functional use,IADL,ROM,Flexibility,Sensation,Coordination       Visit Diagnosis: Stiffness of left hand, not elsewhere classified    Problem List Patient Active Problem List   Diagnosis Date Noted  . 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 Status post shoulder replacement 06/10/2017    Carey Bullocks, OTR/L 03/24/2020, 1:07 PM  [facility] [address] [place], [place], [address] Phone: [phone]   Fax:  [phone]  Name: Katrina Hughes MRN: [id number] Date of Birth: 1950-09-23

## 2020-03-24 NOTE — Therapy (Signed)
[facility] [address] [place], [place], [address] Phone: [phone]   Fax:  [phone]  Physical Therapy Treatment  Patient Details  Name: Katrina Hughes MRN: [id number] Date of Birth: 1950-11-20 Referring Provider (PT): Gomez Cleverly, MD   Encounter Date: 03/24/2020   PT End of Session - 03/24/20 1134    Visit Number 5    Number of Visits 15    Date for PT Re-Evaluation 05/09/20    Authorization Type VA, 15 visits    Authorization Time Period 02/02/20-06/01/20    Authorization - Visit Number 5    Authorization - Number of Visits 15    PT Start Time 1102    PT Stop Time 1142    PT Time Calculation (min) 40 min    Equipment Utilized During Treatment Other (comment)   S prn   Activity Tolerance Patient tolerated treatment well;No increased pain    Behavior During Therapy [facility] for tasks assessed/performed           Past Medical History:  Diagnosis Date  . Anginal pain ([place])   . Bell palsy   . CHF (congestive heart failure) ([place])   . Chronic kidney disease   . Complication of anesthesia    slow to wake up  . Diabetes mellitus without complication ([place])   . Dyspnea   . Environmental and seasonal allergies   . GERD (gastroesophageal reflux disease)   . Glaucoma   . Hypertension   . Hyperthyroidism    pt states that it's hypothyroidism  . Myocardial infarction ([place])   . Sleep apnea     Past Surgical History:  Procedure Laterality Date  . ABDOMINAL HYSTERECTOMY    . APPENDECTOMY    . BICEPT TENODESIS Left 06/10/2017   Procedure: BICEPS TENODESIS;  Surgeon: Leim Fabry, MD;  Location: [facility];  Service: Orthopedics;  Laterality: Left;  . BREAST REDUCTION SURGERY    . CATARACT EXTRACTION, BILATERAL Bilateral   . CHOLECYSTECTOMY    . CORONARY ANGIOPLASTY WITH STENT PLACEMENT    . HERNIA REPAIR    . NASAL HEMORRHAGE CONTROL N/A 09/17/2017   Procedure: EPISTAXIS CONTROL;  Surgeon: [name],  MD;  Location: [facility];  Service: ENT;  Laterality: N/A;  . REVERSE SHOULDER ARTHROPLASTY Left 06/10/2017   Procedure: REVERSE SHOULDER ARTHROPLASTY;  Surgeon: Leim Fabry, MD;  Location: [facility];  Service: Orthopedics;  Laterality: Left;  . REVERSE SHOULDER ARTHROPLASTY Right 11/03/2018   Procedure: REVERSE SHOULDER ARTHROPLASTY, BICEPS TENODESIS;  Surgeon: Leim Fabry, MD;  Location: [facility];  Service: Orthopedics;  Laterality: Right;    There were no vitals filed for this visit.   Subjective Assessment - 03/24/20 1106    Subjective Pt reported she felt fine after last session but walked a lot at [facility] (about an hour) yesterday and L knee is very painful today. HEP is going well. Pt heard from the [place] and they are going to set up pt with an orthopedic surgeon appt. in [place].    Pertinent History Anginal pain, L Bell palsy, CHF, CKD, DM, dyspnea, seasonal allergies, GERD, glaucoma-had surgery to repair, MI (20+ years ago per pt), hyperthyroidism (per chart but pt states it's hypo), HTN, sleep apnea    Patient Stated Goals To go to grocery store without having to ride electric scooter, improve balance to walk further, and decr. pain    Currently in Pain? Yes    Pain Score 7     Pain Location Knee  Pain Orientation Left    Pain Descriptors / Indicators Aching    Pain Type Chronic pain    Pain Onset More than a month ago    Pain Frequency Intermittent    Aggravating Factors  walking, exercise (sometimes)    Pain Relieving Factors cream, rest, stretches                THEREX: Therex and NMR HEP: Access Code: [email or web address] URL: [email or web address] Date: 03/17/2020 Prepared by: Geoffry Paradise  Exercises Reviewed: LLE Supine Active Straight Leg Raise with and without sheet for assistance - 1-2 x daily - 5 x weekly - 1 sets - 10 reps LLE Supine Heel Slide with Strap with hold in flexion of L knee and extension - 1-2 x daily - 5 x weekly - 1 sets - 10  reps  Cues and demo for proper technique.             [facility] Adult PT Treatment/Exercise - 03/24/20 1108      Ambulation/Gait   Ambulation/Gait Yes    Ambulation/Gait Assistance 5: Supervision    Ambulation/Gait Assistance Details S for safety and cues for sequencing with rollator.    Ambulation Distance (Feet) 115 Feet   x3   Assistive device None    Gait Pattern Step-through pattern;Decreased stride length;Decreased stance time - left;Antalgic    Ambulation Surface Level;Indoor    Gait velocity 1.34ftsec/.1.27ft/sec. with rollator    Gait Comments Gait after stretches and NuStep with less pain.      Exercises   Exercises Knee/Hip      Knee/Hip Exercises: Aerobic   Nustep NuStep, Level , 4 extremities x 8 minutes for lower extremity flexibility and strengthening. Pt with intermittent use of BLEs only to improve strength.          6/10 L knee pain at end of session.         PT Education - 03/24/20 1134    Education Details PT edcuated pt on using rollator when amb. long distances, to decr. L knee pain and to take seated rest break prn. PT also encouraged pt to perform stretches prior to activity to improve mobility and decr. L knee pain.    Person(s) Educated Patient    Methods Explanation;Demonstration;Verbal cues    Comprehension Verbalized understanding;Returned demonstration            PT Short Term Goals - 03/14/20 1530      PT SHORT TERM GOAL #1   Title Pt will be IND with HEP to improve pain, balance, flexibility and strength. TARGET DATE FOR ALL STGS: 04/07/20    Time 4    Period Weeks    Status New      PT SHORT TERM GOAL #2   Title Perform BERG and write goals as indicated.    Time 4    Period Weeks    Status Achieved      PT SHORT TERM GOAL #3   Title Pt will improve gait speed with LRAD to >/=1.87ft/sec. to decr. falls risk.    Time 4    Period Weeks    Status New      PT SHORT TERM GOAL #4   Title Pt will report knee pain decr. to  7/10 at worst, on average to improve functional mobility.    Time 4    Period Weeks    Status New      PT SHORT TERM GOAL #5   Title Pt will amb. 300' at MOD  I level with LRAD, with <7/10 knee pan to improve functional mobility.    Time 4    Period Weeks    Status New      Additional Short Term Goals   Additional Short Term Goals Yes      PT SHORT TERM GOAL #6   Title Pt will improve BERG balance score to >/=49/56 in order to indicate dec fall risk.    Time 4    Period Weeks    Status New             PT Long Term Goals - 03/14/20 1531      PT LONG TERM GOAL #1   Title Pt will report no falls in last four weeks to improve safety. TARGET DATE FOR ALL LTGS: 05/02/20    Time 8    Period Weeks    Status New      PT LONG TERM GOAL #2   Title Pt will amb. 500' in/outdoors at MOD I level with LRAD with </=5/10 knee pain to improve functional mobility.    Time 8    Period Weeks    Status New      PT LONG TERM GOAL #3   Title Pt will be able to attempt bowling in order to improve QOL.    Time 8    Period Weeks    Status New      PT LONG TERM GOAL #4   Title Pt will report ability to grocery shop with LRAD and no LOB or incr. pain to perform IADLs.    Time 8    Period Weeks    Status New      PT LONG TERM GOAL #5   Title Pt will score >/=53/56 on BERG balance (and perform DGI/FGA as able) in order to indicate dec fall risk.    Time 8    Period Weeks    Status New                 Plan - 03/24/20 1135    Clinical Impression Statement Pt demonstrated progress as she was able to amb. with less pain after performing stretches and NuStep. Pt continues to require S to ensure safety and cues for proper technique during exercises. Pt noted to experience less gait deviations with rolltaor. Pt would continue to benefit from skilled PT to improve safety and decr. pain during functional mobility.    Personal Factors and Comorbidities Age;Fitness;Comorbidity 3+     Comorbidities Anginal pain, L Bell palsy, CHF, CKD, DM, dyspnea, seasonal allergies, GERD, glaucoma-had surgery to repair, MI (20+ years ago per pt), hyperthyroidism (per chart but pt states it's hypo), HTN, sleep apnea    Examination-Activity Limitations Bed Mobility;Bend;Carry;Continence;Toileting;Stand;Stairs;Squat;Locomotion Level;Transfers    Examination-Participation Restrictions Driving;Meal Prep;Interpersonal Relationship;Laundry    Stability/Clinical Decision Making Evolving/Moderate complexity    Rehab Potential Good    PT Frequency --   2x/week for 6 weeks and then 1x/week for 2 weeks based on VA visit limit of 15.   PT Duration --   2x/week for 6 weeks and then 1x/week for 2 weeks based on VA visit limit of 15.   PT Treatment/Interventions ADLs/Self Care Home Management;Aquatic Therapy;Biofeedback;Canalith Repostioning;DME Instruction;Balance training;Therapeutic exercise;Therapeutic activities;Functional mobility training;Stair training;Gait training;Neuromuscular re-education;Patient/family education;Orthotic Fit/Training;Manual techniques;Vestibular    PT Next Visit Plan Continue gentle strength/ROM with nustep/stepper; balance    PT Home Exercise Plan Medbridge: [email or web address]    Consulted and Agree with Plan of Care Patient  Patient will benefit from skilled therapeutic intervention in order to improve the following deficits and impairments:  Abnormal gait,Decreased range of motion,Difficulty walking,Dizziness,Decreased knowledge of precautions,Decreased endurance,Decreased balance,Decreased knowledge of use of DME,Decreased mobility,Decreased strength,Increased edema,Postural dysfunction,Impaired flexibility,Pain  Visit Diagnosis: Muscle weakness (generalized)  Unsteadiness on feet  Other abnormalities of gait and mobility     Problem List Patient Active Problem List   Diagnosis Date Noted  . 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 Status post  shoulder replacement 06/10/2017    Jocelyn Lowery L 03/24/2020, 11:39 AM  [facility] [address] [place], [place], [address] Phone: [phone]   Fax:  [phone]  Geoffry Paradise, PT,DPT 03/24/20 11:39 AM Phone: [phone] Fax: [phone]   Name: Katrina Hughes MRN: [id number] Date of Birth: 11-Mar-1951

## 2020-03-24 NOTE — Patient Instructions (Addendum)
Therex and NMR HEP: Access Code: [email or web address] URL: [email or web address] Date: 03/17/2020 Prepared by: Geoffry Paradise  Exercises Reviewed: LLE Supine Active Straight Leg Raise with and without sheet for assistance - 1-2 x daily - 5 x weekly - 1 sets - 10 reps LLE Supine Heel Slide with Strap with hold in flexion of L knee and extension - 1-2 x daily - 5 x weekly - 1 sets - 10 reps

## 2020-03-24 NOTE — Patient Instructions (Signed)
Your Splint This splint should initially be fitted by a healthcare practitioner.  The healthcare practitioner is responsible for providing wearing instructions and precautions to the patient, other healthcare practitioners and care provider involved in the patient's care.  This splint was custom made for you. Please read the following instructions to learn about wearing and caring for your splint.  Precautions Should your splint cause any of the following problems, remove the splint immediately and contact your therapist/physician.  Swelling  Severe Pain  Pressure Areas  Stiffness  Numbness  Do not wear your splint while operating machinery unless it has been fabricated for that purpose.  When To Wear Your Splint Where your splint according to your therapist/physician instructions. Nighttime. However, first two days, gradually build up tolerance during the day an hour at a time until you can wear consecutively for 4 hours w/ no problems. Then switch to wearing ONLY at night  Care and Cleaning of Your Splint 1. Keep your splint away from open flames. 2. Your splint will lose its shape in temperatures over 135 degrees Farenheit, ( in car windows, near radiators, ovens or in hot water).  Never make any adjustments to your splint, if the splint needs adjusting remove it and make an appointment to see your therapist. 3. Your splint may be cleaned with rubbing alcohol.  Do not immerse in hot water over 135 degrees Farenheit.

## 2020-03-30 ENCOUNTER — Other Ambulatory Visit: Payer: Self-pay

## 2020-03-30 ENCOUNTER — Ambulatory Visit: Payer: No Typology Code available for payment source | Admitting: Occupational Therapy

## 2020-03-30 ENCOUNTER — Ambulatory Visit: Payer: No Typology Code available for payment source

## 2020-03-30 DIAGNOSIS — R2681 Unsteadiness on feet: Secondary | ICD-10-CM

## 2020-03-30 DIAGNOSIS — M79642 Pain in left hand: Secondary | ICD-10-CM

## 2020-03-30 DIAGNOSIS — M6281 Muscle weakness (generalized): Secondary | ICD-10-CM

## 2020-03-30 DIAGNOSIS — M25642 Stiffness of left hand, not elsewhere classified: Secondary | ICD-10-CM

## 2020-03-30 DIAGNOSIS — R2689 Other abnormalities of gait and mobility: Secondary | ICD-10-CM

## 2020-03-30 DIAGNOSIS — M25522 Pain in left elbow: Secondary | ICD-10-CM

## 2020-03-30 DIAGNOSIS — R208 Other disturbances of skin sensation: Secondary | ICD-10-CM

## 2020-03-30 NOTE — Therapy (Signed)
[facility] [address] [place], [place], [address] Phone: [phone]   Fax:  [phone]  Physical Therapy Treatment  Patient Details  Name: Katrina Hughes MRN: [id number] Date of Birth: 01/20/1951 Referring Provider (PT): Gomez Cleverly, MD   Encounter Date: 03/30/2020   PT End of Session - 03/30/20 1404    Visit Number 6    Number of Visits 15    Date for PT Re-Evaluation 05/09/20    Authorization Type VA, 15 visits    Authorization Time Period 02/02/20-06/01/20    Authorization - Visit Number 6    Authorization - Number of Visits 15    PT Start Time 1324    PT Stop Time 4010    PT Time Calculation (min) 42 min    Equipment Utilized During Treatment Other (comment)   S prn   Activity Tolerance Patient tolerated treatment well;No increased pain    Behavior During Therapy WFL for tasks assessed/performed           Past Medical History:  Diagnosis Date  . Anginal pain ([place])   . Bell palsy   . CHF (congestive heart failure) ([place])   . Chronic kidney disease   . Complication of anesthesia    slow to wake up  . Diabetes mellitus without complication ([place])   . Dyspnea   . Environmental and seasonal allergies   . GERD (gastroesophageal reflux disease)   . Glaucoma   . Hypertension   . Hyperthyroidism    pt states that it's hypothyroidism  . Myocardial infarction ([place])   . Sleep apnea     Past Surgical History:  Procedure Laterality Date  . ABDOMINAL HYSTERECTOMY    . APPENDECTOMY    . BICEPT TENODESIS Left 06/10/2017   Procedure: BICEPS TENODESIS;  Surgeon: Leim Fabry, MD;  Location: [facility];  Service: Orthopedics;  Laterality: Left;  . BREAST REDUCTION SURGERY    . CATARACT EXTRACTION, BILATERAL Bilateral   . CHOLECYSTECTOMY    . CORONARY ANGIOPLASTY WITH STENT PLACEMENT    . HERNIA REPAIR    . NASAL HEMORRHAGE CONTROL N/A 09/17/2017   Procedure: EPISTAXIS CONTROL;  Surgeon: [name],  MD;  Location: [facility];  Service: ENT;  Laterality: N/A;  . REVERSE SHOULDER ARTHROPLASTY Left 06/10/2017   Procedure: REVERSE SHOULDER ARTHROPLASTY;  Surgeon: Leim Fabry, MD;  Location: [facility];  Service: Orthopedics;  Laterality: Left;  . REVERSE SHOULDER ARTHROPLASTY Right 11/03/2018   Procedure: REVERSE SHOULDER ARTHROPLASTY, BICEPS TENODESIS;  Surgeon: Leim Fabry, MD;  Location: [facility];  Service: Orthopedics;  Laterality: Right;    There were no vitals filed for this visit.   Subjective Assessment - 03/30/20 1405    Subjective Patients reports that pain in knee is getting better, but still having swelling. Swelling improves with elevation. Still have not recieved a phone call about the orthopedic surgeon appt.    Pertinent History Anginal pain, L Bell palsy, CHF, CKD, DM, dyspnea, seasonal allergies, GERD, glaucoma-had surgery to repair, MI (20+ years ago per pt), hyperthyroidism (per chart but pt states it's hypo), HTN, sleep apnea    Patient Stated Goals To go to grocery store without having to ride electric scooter, improve balance to walk further, and decr. pain    Currently in Pain? Yes    Pain Score 6     Pain Location Knee    Pain Orientation Left    Pain Descriptors / Indicators Aching;Discomfort    Pain Type Chronic pain  Pain Onset More than a month ago              [facility] Adult PT Treatment/Exercise - 03/30/20 0001      Ambulation/Gait   Ambulation/Gait Yes    Ambulation/Gait Assistance 5: Supervision    Ambulation/Gait Assistance Details Completed ambulation with rollator x 345 ft. Inccreased fatigue at end of ambulation, no increase in pain with ambulation reported.    Ambulation Distance (Feet) 345 Feet    Assistive device Rollator    Gait Pattern Step-through pattern;Decreased stride length;Decreased stance time - left;Antalgic    Ambulation Surface Level;Indoor    Gait velocity 2.04 ft/sec      Standardized Balance Assessment   Standardized Balance  Assessment Berg Balance Test      Berg Balance Test   Sit to Stand Able to stand without using hands and stabilize independently    Standing Unsupported Able to stand safely 2 minutes    Sitting with Back Unsupported but Feet Supported on Floor or Stool Able to sit safely and securely 2 minutes    Stand to Sit Controls descent by using hands    Transfers Able to transfer safely, definite need of hands    Standing Unsupported with Eyes Closed Able to stand 10 seconds safely    Standing Ubsupported with Feet Together Able to place feet together independently and stand 1 minute safely    From Standing, Reach Forward with Outstretched Arm Can reach confidently >25 cm (10")    From Standing Position, Pick up Object from Floor Able to pick up shoe, needs supervision    From Standing Position, Turn to Look Behind Over each Shoulder Looks behind one side only/other side shows less weight shift    Turn 360 Degrees Able to turn 360 degrees safely but slowly    Standing Unsupported, Alternately Place Feet on Step/Stool Able to stand independently and complete 8 steps >20 seconds    Standing Unsupported, One Foot in Front Able to plae foot ahead of the other independently and hold 30 seconds    Standing on One Leg Able to lift leg independently and hold equal to or more than 3 seconds    Total Score 46      Self-Care   Self-Care Other Self-Care Comments    Other Self-Care Comments  Patient reporting that with increased ambulation and busy days patient pain continues to reach 8/10. Patient continues to have increased swelling, PT educating to continue elevate LLE to continue to help control swelling. Patient also reporting she will be recieving new knee brace, PT educating ot bring brace into next session if able.      Exercises   Exercises Other Exercises    Other Exercises  PT verbal review current HEP with patient to ensure compliance and proper completion. No issues reported via patient. Completed  approx 2x/daily.          Verbal Reviewed following HEP:   Access Code: [email or web address] URL: [email or web address] Date: 03/30/2020 Prepared by: Baldomero Lamy  Exercises Supine Active Straight Leg Raise - 1-2 x daily - 5 x weekly - 1 sets - 10 reps Supine Heel Slide with Strap - 1-2 x daily - 5 x weekly - 1 sets - 10 reps Clamshell - 1-2 x daily - 5 x weekly - 1 sets - 10 reps Standing Single Leg Stance with Counter Support - 1 x daily - 5 x weekly - 1 sets - 3 reps - 10-20 hold Feet together with head nods and  head turns - 1 x daily - 5 x weekly - 1 sets - 3 reps Supine Knee Extension Strengthening - 2 x daily - 5 x weekly - 2 sets - 5 reps - 1-2 seconds hold           PT Short Term Goals - 03/30/20 1409      PT SHORT TERM GOAL #1   Title Pt will be IND with HEP to improve pain, balance, flexibility and strength. TARGET DATE FOR ALL STGS: 04/07/20    Baseline Patient reports independence with HEP, completed 2x/daily.    Time 4    Period Weeks    Status Achieved      PT SHORT TERM GOAL #2   Title Perform BERG and write goals as indicated.    Time 4    Period Weeks    Status Achieved      PT SHORT TERM GOAL #3   Title Pt will improve gait speed with LRAD to >/=1.4ft/sec. to decr. falls risk.    Time 4    Period Weeks    Status Achieved      PT SHORT TERM GOAL #4   Title Pt will report knee pain decr. to 7/10 at worst, on average to improve functional mobility.    Baseline gets up to 8/10 with alot of walking per patient reports    Time 4    Period Weeks    Status On-going      PT SHORT TERM GOAL #5   Title Pt will amb. 300' at MOD I level with LRAD, with <7/10 knee pan to improve functional mobility.    Baseline 345' with Rollator, 6/10 pain    Time 4    Period Weeks    Status Achieved      PT SHORT TERM GOAL #6   Title Pt will improve BERG balance score to >/=49/56 in order to indicate dec fall risk.    Baseline 46/56    Time 4    Period  Weeks    Status On-going             PT Long Term Goals - 03/14/20 1531      PT LONG TERM GOAL #1   Title Pt will report no falls in last four weeks to improve safety. TARGET DATE FOR ALL LTGS: 05/02/20    Time 8    Period Weeks    Status New      PT LONG TERM GOAL #2   Title Pt will amb. 500' in/outdoors at MOD I level with LRAD with </=5/10 knee pain to improve functional mobility.    Time 8    Period Weeks    Status New      PT LONG TERM GOAL #3   Title Pt will be able to attempt bowling in order to improve QOL.    Time 8    Period Weeks    Status New      PT LONG TERM GOAL #4   Title Pt will report ability to grocery shop with LRAD and no LOB or incr. pain to perform IADLs.    Time 8    Period Weeks    Status New      PT LONG TERM GOAL #5   Title Pt will score >/=53/56 on BERG balance (and perform DGI/FGA as able) in order to indicate dec fall risk.    Time 8    Period Weeks    Status New  Plan - 03/30/20 1450    Clinical Impression Statement Today's skilled PT session included assesment of patient's progress toward all STGs. Patient able to meet STG #1, 2, 3, and 5 today. Patient demonstrating improved ambulation distance with rollator without increase in pain today. Patient also improving gait speed to 2.04 ft/sec demonstrating limited community ambulator. Patient has made progress with balance with improvements of Berg Balance to 46/56. Patient continues to make progress with PT services and will benefit from continued skilled PT services to progress toward all LTGs.    Personal Factors and Comorbidities Age;Fitness;Comorbidity 3+    Comorbidities Anginal pain, L Bell palsy, CHF, CKD, DM, dyspnea, seasonal allergies, GERD, glaucoma-had surgery to repair, MI (20+ years ago per pt), hyperthyroidism (per chart but pt states it's hypo), HTN, sleep apnea    Examination-Activity Limitations Bed  Mobility;Bend;Carry;Continence;Toileting;Stand;Stairs;Squat;Locomotion Level;Transfers    Examination-Participation Restrictions Driving;Meal Prep;Interpersonal Relationship;Laundry    Stability/Clinical Decision Making Evolving/Moderate complexity    Rehab Potential Good    PT Frequency --   2x/week for 6 weeks and then 1x/week for 2 weeks based on VA visit limit of 15.   PT Duration --   2x/week for 6 weeks and then 1x/week for 2 weeks based on VA visit limit of 15.   PT Treatment/Interventions ADLs/Self Care Home Management;Aquatic Therapy;Biofeedback;Canalith Repostioning;DME Instruction;Balance training;Therapeutic exercise;Therapeutic activities;Functional mobility training;Stair training;Gait training;Neuromuscular re-education;Patient/family education;Orthotic Fit/Training;Manual techniques;Vestibular    PT Next Visit Plan Did we get new knee brace? Continue gentle strength/ROM with nustep/stepper; balance    PT Home Exercise Plan Medbridge: [email or web address]    Consulted and Agree with Plan of Care Patient           Patient will benefit from skilled therapeutic intervention in order to improve the following deficits and impairments:  Abnormal gait,Decreased range of motion,Difficulty walking,Dizziness,Decreased knowledge of precautions,Decreased endurance,Decreased balance,Decreased knowledge of use of DME,Decreased mobility,Decreased strength,Increased edema,Postural dysfunction,Impaired flexibility,Pain  Visit Diagnosis: Muscle weakness (generalized)  Unsteadiness on feet  Other abnormalities of gait and mobility     Problem List Patient Active Problem List   Diagnosis Date Noted  . 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 Status post shoulder replacement 06/10/2017    Jones Bales, PT, DPT 03/30/2020, 2:59 PM  [facility] [address] [place], [place], [address] Phone: [phone]   Fax:   [phone]  Name: Katrina Hughes MRN: [id number] Date of Birth: 1950/10/29

## 2020-03-30 NOTE — Therapy (Signed)
[facility] [address] [place], [place], [address] Phone: [phone]   Fax:  [phone]  Occupational Therapy Treatment  Patient Details  Name: Katrina Hughes MRN: [id number] Date of Birth: Dec 07, 1950 No data recorded  Encounter Date: 03/30/2020   OT End of Session - 03/30/20 1323    Visit Number 5    Number of Visits 15    Date for OT Re-Evaluation 04/28/20    Authorization Type VA - Approved 15 visits (already used 1 at the [place] [facility] for O.T. )    Authorization Time Period 02/02/20 - 06/01/20    Authorization - Visit Number 6    Authorization - Number of Visits 15    OT Start Time 8127    OT Stop Time 1400    OT Time Calculation (min) 39 min    Activity Tolerance Patient tolerated treatment well    Behavior During Therapy [facility] for tasks assessed/performed           Past Medical History:  Diagnosis Date  . Anginal pain ([place])   . Bell palsy   . CHF (congestive heart failure) ([place])   . Chronic kidney disease   . Complication of anesthesia    slow to wake up  . Diabetes mellitus without complication ([place])   . Dyspnea   . Environmental and seasonal allergies   . GERD (gastroesophageal reflux disease)   . Glaucoma   . Hypertension   . Hyperthyroidism    pt states that it's hypothyroidism  . Myocardial infarction ([place])   . Sleep apnea     Past Surgical History:  Procedure Laterality Date  . ABDOMINAL HYSTERECTOMY    . APPENDECTOMY    . BICEPT TENODESIS Left 06/10/2017   Procedure: BICEPS TENODESIS;  Surgeon: Leim Fabry, MD;  Location: [facility];  Service: Orthopedics;  Laterality: Left;  . BREAST REDUCTION SURGERY    . CATARACT EXTRACTION, BILATERAL Bilateral   . CHOLECYSTECTOMY    . CORONARY ANGIOPLASTY WITH STENT PLACEMENT    . HERNIA REPAIR    . NASAL HEMORRHAGE CONTROL N/A 09/17/2017   Procedure: EPISTAXIS CONTROL;  Surgeon: [name], MD;  Location: [facility];  Service: ENT;  Laterality: N/A;  .  REVERSE SHOULDER ARTHROPLASTY Left 06/10/2017   Procedure: REVERSE SHOULDER ARTHROPLASTY;  Surgeon: Leim Fabry, MD;  Location: [facility];  Service: Orthopedics;  Laterality: Left;  . REVERSE SHOULDER ARTHROPLASTY Right 11/03/2018   Procedure: REVERSE SHOULDER ARTHROPLASTY, BICEPS TENODESIS;  Surgeon: Leim Fabry, MD;  Location: [facility];  Service: Orthopedics;  Laterality: Right;    There were no vitals filed for this visit.   Subjective Assessment - 03/30/20 1322    Subjective  Pt reports that she had a note from the doctor but it is in car (wife left).  Pt reports no problems with splint.    Pertinent History displaced fx of proximal phalanx Lt small finger after fall on 12/10/19    Currently in Pain? No/denies    Pain Onset More than a month ago    Pain Onset More than a month ago           Ultrasound x8 min to L medial elbow/forearm 50% pulsed, 1.0 wt/cm2, 58mhz with no adverse reactions   Fludio x29min to L hand with no adverse reactions for stiffness  PROM to L 5th digit in isolated MP flex, isolated IP flex and composite flex, followed by place and holds in composite flex.  AROM isolated MP flex with min cueing  as able          OT Education - 03/30/20 1348    Education Details Medial epicondylitis Phase I (massage and use of moist heat) and Phase II (AROM exercise #1)    Person(s) Educated Patient    Methods Explanation;Demonstration;Handout;Verbal cues    Comprehension Verbalized understanding;Returned demonstration;Verbal cues required            OT Short Term Goals - 03/24/20 1304      OT SHORT TERM GOAL #1   Title Independent with initial HEP    Time 3    Period Weeks    Status Achieved      OT SHORT TERM GOAL #2   Title Pt to demo 75% composite finger flexion in prep for better grasping    Baseline 40%    Time 3    Period Weeks    Status On-going      OT SHORT TERM GOAL #3   Title Pt to report decreased numbness and pain along ulnar n. distribution  and at medial elbow w/ wrist flexion    Time 3    Period Weeks    Status New      OT SHORT TERM GOAL #4   Title Pt to improve grip strength Lt hand to 15 lbs    Baseline 9 lbs    Time 3    Period Weeks    Status On-going             OT Long Term Goals - 03/10/20 1552      OT LONG TERM GOAL #1   Title Independent with updated HEP    Time 7    Period Weeks    Status New      OT LONG TERM GOAL #2   Title Pt to demo 90% or greater full composite flexion Lt hand    Time 7    Period Weeks    Status New      OT LONG TERM GOAL #3   Title Pt to demo 20 lbs or greater grip strength Lt hand    Baseline 9 lbs    Time 7    Period Weeks    Status New      OT LONG TERM GOAL #4   Title Pt to consistently use Lt hand as assist for bilateral tasks    Time 7    Period Weeks    Status New                 Plan - 03/30/20 1349    Clinical Impression Statement Pt is progressing with incr ROM after heat/stretching and is progressing towards goals.    OT Occupational Profile and History Detailed Assessment- Review of Records and additional review of physical, cognitive, psychosocial history related to current functional performance    Occupational performance deficits (Please refer to evaluation for details): IADL's    Body Structure / Function / Physical Skills Strength;Pain;Dexterity;Edema;UE functional use;IADL;ROM;Flexibility;Sensation;Coordination    Rehab Potential Good    Clinical Decision Making Several treatment options, min-mod task modification necessary    Comorbidities Affecting Occupational Performance: May have comorbidities impacting occupational performance    Modification or Assistance to Complete Evaluation  No modification of tasks or assist necessary to complete eval    OT Frequency 2x / week    OT Duration --   for 7 weeks   OT Treatment/Interventions Self-care/ADL training;Moist Heat;Fluidtherapy;DME and/or AE instruction;Splinting;Therapeutic  activities;Ultrasound;Therapeutic exercise;Cryotherapy;Neuromuscular education;Functional Mobility Training;Passive range of motion;Electrical Stimulation;Paraffin;Manual  Therapy;Patient/family education    Plan pulsed [place] over proximal medial elbow, review massge to proximal medial elbow and wrist ext with elbow flex and neutral forearm    Consulted and Agree with Plan of Care Patient    Family Member Consulted wife           Patient will benefit from skilled therapeutic intervention in order to improve the following deficits and impairments:   Body Structure / Function / Physical Skills: Strength,Pain,Dexterity,Edema,UE functional use,IADL,ROM,Flexibility,Sensation,Coordination       Visit Diagnosis: Stiffness of left hand, not elsewhere classified  Muscle weakness (generalized)  Other disturbances of skin sensation  Pain in left hand  Pain in left elbow    Problem List Patient Active Problem List   Diagnosis Date Noted  . 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 Status post shoulder replacement 06/10/2017    [facility] 03/30/2020, 2:10 PM  [facility] [address] [place], [place], [address] Phone: [phone]   Fax:  [phone]  Name: Shalika Arntz MRN: [id number] Date of Birth: 1951/03/21   Vianne Bulls, OTR/L [facility] [address] [place], [place]  [phone] phone [phone] 03/30/20 2:10 PM

## 2020-04-11 ENCOUNTER — Ambulatory Visit: Payer: No Typology Code available for payment source | Attending: Internal Medicine | Admitting: Physical Therapy

## 2020-04-11 ENCOUNTER — Encounter: Payer: Self-pay | Admitting: Physical Therapy

## 2020-04-11 ENCOUNTER — Other Ambulatory Visit: Payer: Self-pay

## 2020-04-11 ENCOUNTER — Ambulatory Visit: Payer: No Typology Code available for payment source | Admitting: Occupational Therapy

## 2020-04-11 DIAGNOSIS — M25561 Pain in right knee: Secondary | ICD-10-CM | POA: Insufficient documentation

## 2020-04-11 DIAGNOSIS — M6281 Muscle weakness (generalized): Secondary | ICD-10-CM

## 2020-04-11 DIAGNOSIS — G8929 Other chronic pain: Secondary | ICD-10-CM | POA: Diagnosis present

## 2020-04-11 DIAGNOSIS — R208 Other disturbances of skin sensation: Secondary | ICD-10-CM | POA: Diagnosis present

## 2020-04-11 DIAGNOSIS — M25562 Pain in left knee: Secondary | ICD-10-CM | POA: Insufficient documentation

## 2020-04-11 DIAGNOSIS — M25642 Stiffness of left hand, not elsewhere classified: Secondary | ICD-10-CM | POA: Insufficient documentation

## 2020-04-11 DIAGNOSIS — R2681 Unsteadiness on feet: Secondary | ICD-10-CM | POA: Diagnosis present

## 2020-04-11 DIAGNOSIS — M25522 Pain in left elbow: Secondary | ICD-10-CM

## 2020-04-11 DIAGNOSIS — R2689 Other abnormalities of gait and mobility: Secondary | ICD-10-CM

## 2020-04-11 NOTE — Therapy (Signed)
[facility] [address] [place], [place], [address] Phone: [phone]   Fax:  [phone]  Occupational Therapy Treatment  Patient Details  Name: Katrina Hughes MRN: [id number] Date of Birth: October 17, 1950 No data recorded  Encounter Date: 04/11/2020   OT End of Session - 04/11/20 1458    Visit Number 6    Number of Visits 15    Date for OT Re-Evaluation 04/28/20    Authorization Type VA - Approved 15 visits (already used 1 at the [place] for O.T. )    Authorization Time Period 02/02/20 - 06/01/20    Authorization - Visit Number 7    Authorization - Number of Visits 15    OT Start Time 1230    OT Stop Time 1315    OT Time Calculation (min) 45 min    Activity Tolerance Patient tolerated treatment well    Behavior During Therapy [facility] for tasks assessed/performed           Past Medical History:  Diagnosis Date  . Anginal pain ([place])   . Bell palsy   . CHF (congestive heart failure) ([place])   . Chronic kidney disease   . Complication of anesthesia    slow to wake up  . Diabetes mellitus without complication ([place])   . Dyspnea   . Environmental and seasonal allergies   . GERD (gastroesophageal reflux disease)   . Glaucoma   . Hypertension   . Hyperthyroidism    pt states that it's hypothyroidism  . Myocardial infarction ([place])   . Sleep apnea     Past Surgical History:  Procedure Laterality Date  . ABDOMINAL HYSTERECTOMY    . APPENDECTOMY    . BICEPT TENODESIS Left 06/10/2017   Procedure: BICEPS TENODESIS;  Surgeon: Leim Fabry, MD;  Location: [facility];  Service: Orthopedics;  Laterality: Left;  . BREAST REDUCTION SURGERY    . CATARACT EXTRACTION, BILATERAL Bilateral   . CHOLECYSTECTOMY    . CORONARY ANGIOPLASTY WITH STENT PLACEMENT    . HERNIA REPAIR    . NASAL HEMORRHAGE CONTROL N/A 09/17/2017   Procedure: EPISTAXIS CONTROL;  Surgeon: [name], MD;  Location: [place];  Service: ENT;  Laterality: N/A;  .  REVERSE SHOULDER ARTHROPLASTY Left 06/10/2017   Procedure: REVERSE SHOULDER ARTHROPLASTY;  Surgeon: Leim Fabry, MD;  Location: [facility];  Service: Orthopedics;  Laterality: Left;  . REVERSE SHOULDER ARTHROPLASTY Right 11/03/2018   Procedure: REVERSE SHOULDER ARTHROPLASTY, BICEPS TENODESIS;  Surgeon: Leim Fabry, MD;  Location: [facility];  Service: Orthopedics;  Laterality: Right;    There were no vitals filed for this visit.   Subjective Assessment - 04/11/20 1233    Subjective  Letter from PA-C at [facility] reports pt is approved to see hand specialist (Dr. Amedeo Plenty) but pt reports appointment has not been made yet - "they have yet to call me"    Pertinent History displaced fx of proximal phalanx Lt small finger after fall on 12/10/19    Currently in Pain? Yes    Pain Score 5     Pain Location Hand   ulnar side   Pain Orientation Left    Pain Descriptors / Indicators Aching    Pain Type Chronic pain    Pain Onset More than a month ago    Pain Frequency Intermittent    Aggravating Factors  lifting/holding things in hand    Pain Relieving Factors cream, rest, stretches    Pain Onset More than a month ago  Ultrasound x 8 minutes, 50% pulsed, 3 Mhz, 1.0 wts/cm2 over proximal medial forearm to help with pain management and swelling/edema.  Discussed note from PA-C and how a referral has already been made to an orthopedic MD re: symptoms however pt has not yet been contacted with appointment.  Thoroughly reviewed all previous ex's from HEP's including phase I and phase II for medial epicondylitis as pt benefits from review to perform correctly. Discussed possibly placing pt on hold after next visit until pt can see Dr. Amedeo Plenty                        OT Short Term Goals - 04/11/20 1459      OT [place] #1   Title Independent with initial HEP    Time 3    Period Weeks    Status Achieved      OT SHORT TERM GOAL #2   Title Pt to demo 75% composite finger flexion  in prep for better grasping    Baseline 40%    Time 3    Period Weeks    Status On-going      OT SHORT TERM GOAL #3   Title Pt to report decreased numbness and pain along ulnar n. distribution and at medial elbow w/ wrist flexion    Time 3    Period Weeks    Status On-going      OT SHORT TERM GOAL #4   Title Pt to improve grip strength Lt hand to 15 lbs    Baseline 9 lbs    Time 3    Period Weeks    Status On-going             OT Long Term Goals - 03/10/20 1552      OT LONG TERM GOAL #1   Title Independent with updated HEP    Time 7    Period Weeks    Status New      OT LONG TERM GOAL #2   Title Pt to demo 90% or greater full composite flexion Lt hand    Time 7    Period Weeks    Status New      OT LONG TERM GOAL #3   Title Pt to demo 20 lbs or greater grip strength Lt hand    Baseline 9 lbs    Time 7    Period Weeks    Status New      OT LONG TERM GOAL #4   Title Pt to consistently use Lt hand as assist for bilateral tasks    Time 7    Period Weeks    Status New                 Plan - 04/11/20 1459    Clinical Impression Statement Pt is progressing with incr ROM after heat/stretching and is progressing towards goals.    OT Occupational Profile and History Detailed Assessment- Review of Records and additional review of physical, cognitive, psychosocial history related to current functional performance    Occupational performance deficits (Please refer to evaluation for details): IADL's    Body Structure / Function / Physical Skills Strength;Pain;Dexterity;Edema;UE functional use;IADL;ROM;Flexibility;Sensation;Coordination    Rehab Potential Good    Clinical Decision Making Several treatment options, min-mod task modification necessary    Comorbidities Affecting Occupational Performance: May have comorbidities impacting occupational performance    Modification or Assistance to Complete Evaluation  No modification of tasks or assist  necessary to  complete eval    OT Frequency 2x / week    OT Duration --   for 7 weeks   OT Treatment/Interventions Self-care/ADL training;Moist Heat;Fluidtherapy;DME and/or AE instruction;Splinting;Therapeutic activities;Ultrasound;Therapeutic exercise;Cryotherapy;Neuromuscular education;Functional Mobility Training;Passive range of motion;Electrical Stimulation;Paraffin;Manual Therapy;Patient/family education    Plan continue pulsed [place] over proximal medial elbow, write note to MD and consider placing on hold until after she sees hand specialist, assess STG's    Consulted and Agree with Plan of Care Patient    Family Member Consulted wife           Patient will benefit from skilled therapeutic intervention in order to improve the following deficits and impairments:   Body Structure / Function / Physical Skills: Strength,Pain,Dexterity,Edema,UE functional use,IADL,ROM,Flexibility,Sensation,Coordination       Visit Diagnosis: Stiffness of left hand, not elsewhere classified  Muscle weakness (generalized)  Pain in left elbow    Problem List Patient Active Problem List   Diagnosis Date Noted  . 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 Status post shoulder replacement 06/10/2017    Carey Bullocks, OTR/L 04/11/2020, 3:00 PM  [facility] [address] [place], [place], [address] Phone: [phone]   Fax:  [phone]  Name: Daionna Crossland MRN: [id number] Date of Birth: [birth year]

## 2020-04-11 NOTE — Patient Instructions (Signed)
Access Code: [email or web address] URL: [email or web address] Date: 04/11/2020 Prepared by: Misty Stanley  Exercises Supine Active Straight Leg Raise - 1-2 x daily - 5 x weekly - 1 sets - 10 reps Supine Heel Slide with Strap - 1-2 x daily - 5 x weekly - 1 sets - 10 reps Clamshell - 1-2 x daily - 5 x weekly - 1 sets - 10 reps Standing Single Leg Stance with Counter Support - 1 x daily - 5 x weekly - 1 sets - 3 reps - 10-20 hold Standing Hip Extension with Unilateral Counter Support - 1 x daily - 7 x weekly - 2 sets - 10 reps Feet together with head nods and head turns - 1 x daily - 5 x weekly - 1 sets - 3 reps Supine Knee Extension Strengthening - 2 x daily - 5 x weekly - 2 sets - 5 reps - 1-2 seconds hold Seated Hamstring Stretch - 2 x daily - 7 x weekly - 2 sets - 30 second hold

## 2020-04-11 NOTE — Therapy (Signed)
[facility] [address] [place], [place], [address] Phone: [phone]   Fax:  [phone]  Physical Therapy Treatment  Patient Details  Name: Katrina Hughes MRN: [id number] Date of Birth: 10-31-1950 Referring Provider (PT): Gomez Cleverly, MD   Encounter Date: 04/11/2020   PT End of Session - 04/11/20 1400    Visit Number 7    Number of Visits 15    Date for PT Re-Evaluation 05/09/20    Authorization Type VA, 15 visits    Authorization Time Period 02/02/20-06/01/20    Authorization - Visit Number 7    Authorization - Number of Visits 15    PT Start Time 9983    PT Stop Time 1357    PT Time Calculation (min) 42 min    Activity Tolerance Patient tolerated treatment well    Behavior During Therapy [facility] for tasks assessed/performed           Past Medical History:  Diagnosis Date  . Anginal pain ([place])   . Bell palsy   . CHF (congestive heart failure) ([place])   . Chronic kidney disease   . Complication of anesthesia    slow to wake up  . Diabetes mellitus without complication ([place])   . Dyspnea   . Environmental and seasonal allergies   . GERD (gastroesophageal reflux disease)   . Glaucoma   . Hypertension   . Hyperthyroidism    pt states that it's hypothyroidism  . Myocardial infarction ([place])   . Sleep apnea     Past Surgical History:  Procedure Laterality Date  . ABDOMINAL HYSTERECTOMY    . APPENDECTOMY    . BICEPT TENODESIS Left 06/10/2017   Procedure: BICEPS TENODESIS;  Surgeon: Leim Fabry, MD;  Location: [facility];  Service: Orthopedics;  Laterality: Left;  . BREAST REDUCTION SURGERY    . CATARACT EXTRACTION, BILATERAL Bilateral   . CHOLECYSTECTOMY    . CORONARY ANGIOPLASTY WITH STENT PLACEMENT    . HERNIA REPAIR    . NASAL HEMORRHAGE CONTROL N/A 09/17/2017   Procedure: EPISTAXIS CONTROL;  Surgeon: [name], MD;  Location: [facility];  Service: ENT;  Laterality: N/A;  . REVERSE SHOULDER  ARTHROPLASTY Left 06/10/2017   Procedure: REVERSE SHOULDER ARTHROPLASTY;  Surgeon: Leim Fabry, MD;  Location: [facility];  Service: Orthopedics;  Laterality: Left;  . REVERSE SHOULDER ARTHROPLASTY Right 11/03/2018   Procedure: REVERSE SHOULDER ARTHROPLASTY, BICEPS TENODESIS;  Surgeon: Leim Fabry, MD;  Location: [facility];  Service: Orthopedics;  Laterality: Right;    There were no vitals filed for this visit.   Subjective Assessment - 04/11/20 1317    Subjective Pt got a knee brace but it was too big, she measured her knee size and they are going to send her one that fits better.  Knee is only a little swollen today but is tight from sitting for OT session.    Pertinent History Anginal pain, L Bell palsy, CHF, CKD, DM, dyspnea, seasonal allergies, GERD, glaucoma-had surgery to repair, MI (20+ years ago per pt), hyperthyroidism (per chart but pt states it's hypo), HTN, sleep apnea    Patient Stated Goals To go to grocery store without having to ride electric scooter, improve balance to walk further, and decr. pain    Currently in Pain? Yes    Pain Score 7     Pain Location Knee    Pain Orientation Left    Pain Descriptors / Indicators Sore    Pain Onset More than a month ago  [facility] Adult PT Treatment/Exercise - 04/11/20 1324      Exercises   Exercises Other Exercises    Other Exercises  Added to HEP standing open chain hip extension for glute max and hamstring strengthening and seated hamstring stretch on LLE.  See HEP below.      Knee/Hip Exercises: Aerobic   Nustep Level 4 resistance, bilat UE and LE x 8 minutes to increase muscle activity, blood flow, ROM, strengthening and endurance prior to exercises with PT; pt reporting stiffness after sitting for OT session      Knee/Hip Exercises: Standing   Forward Step Up Right;Left;10 reps;2 sets;Hand Hold: 1;Step Height: 4"    Forward Step Up Limitations one UE support on counter to prepare for  stair negotiation training    Other Standing Knee Exercises Forward taps to 4" step, alternating LE without UE support and close supervision.  Cues to fully lift foot up to avoid catching on step.  Then performed alternating taps to 4" step with slight rotation by tapping across midline x 12 reps without UE support but with min A.           Access Code: [email or web address] URL: [email or web address] Date: 04/11/2020 Prepared by: Misty Stanley  Exercises Supine Active Straight Leg Raise - 1-2 x daily - 5 x weekly - 1 sets - 10 reps Supine Heel Slide with Strap - 1-2 x daily - 5 x weekly - 1 sets - 10 reps Clamshell - 1-2 x daily - 5 x weekly - 1 sets - 10 reps Standing Single Leg Stance with Counter Support - 1 x daily - 5 x weekly - 1 sets - 3 reps - 10-20 hold Standing Hip Extension with Unilateral Counter Support - 1 x daily - 7 x weekly - 2 sets - 10 reps Feet together with head nods and head turns - 1 x daily - 5 x weekly - 1 sets - 3 reps Supine Knee Extension Strengthening - 2 x daily - 5 x weekly - 2 sets - 5 reps - 1-2 seconds hold Seated Hamstring Stretch - 2 x daily - 7 x weekly - 2 sets - 30 second hold          PT Education - 04/11/20 1400    Education Details updated HEP, aquatic therapy option    Person(s) Educated Patient    Methods Explanation;Demonstration;Handout    Comprehension Verbalized understanding;Returned demonstration            PT Short Term Goals - 03/30/20 1409      PT SHORT TERM GOAL #1   Title Pt will be IND with HEP to improve pain, balance, flexibility and strength. TARGET DATE FOR ALL STGS: 04/07/20    Baseline Patient reports independence with HEP, completed 2x/daily.    Time 4    Period Weeks    Status Achieved      PT SHORT TERM GOAL #2   Title Perform BERG and write goals as indicated.    Time 4    Period Weeks    Status Achieved      PT SHORT TERM GOAL #3   Title Pt will improve gait speed with LRAD to >/=1.34ft/sec. to  decr. falls risk.    Time 4    Period Weeks    Status Achieved      PT SHORT TERM GOAL #4   Title Pt will report knee pain decr. to 7/10 at worst, on average to improve functional mobility.    Baseline gets  up to 8/10 with alot of walking per patient reports    Time 4    Period Weeks    Status On-going      PT SHORT TERM GOAL #5   Title Pt will amb. 300' at MOD I level with LRAD, with <7/10 knee pan to improve functional mobility.    Baseline 345' with Rollator, 6/10 pain    Time 4    Period Weeks    Status Achieved      PT SHORT TERM GOAL #6   Title Pt will improve BERG balance score to >/=49/56 in order to indicate dec fall risk.    Baseline 46/56    Time 4    Period Weeks    Status On-going             PT Long Term Goals - 03/14/20 1531      PT LONG TERM GOAL #1   Title Pt will report no falls in last four weeks to improve safety. TARGET DATE FOR ALL LTGS: 05/02/20    Time 8    Period Weeks    Status New      PT LONG TERM GOAL #2   Title Pt will amb. 500' in/outdoors at MOD I level with LRAD with </=5/10 knee pain to improve functional mobility.    Time 8    Period Weeks    Status New      PT LONG TERM GOAL #3   Title Pt will be able to attempt bowling in order to improve QOL.    Time 8    Period Weeks    Status New      PT LONG TERM GOAL #4   Title Pt will report ability to grocery shop with LRAD and no LOB or incr. pain to perform IADLs.    Time 8    Period Weeks    Status New      PT LONG TERM GOAL #5   Title Pt will score >/=53/56 on BERG balance (and perform DGI/FGA as able) in order to indicate dec fall risk.    Time 8    Period Weeks    Status New                 Plan - 04/11/20 1700    Clinical Impression Statement Patient continues to report increase in pain when she experiences increase in edema.  Discussed with patient purpose and benefits of aquatic therapy for edema and reducing pressure on knee joint.  Pt would be willing to try  aquatic therapy; aquatic therapy already included in patient POC.  Added to HEP to include hip extension strengthening and hamstring ROM.  Initiated stair negotiation training as pt has a goal to return to negotiating stairs at home instead of ramp.  Will continue to progress towards LTG.    Personal Factors and Comorbidities Age;Fitness;Comorbidity 3+    Comorbidities Anginal pain, L Bell palsy, CHF, CKD, DM, dyspnea, seasonal allergies, GERD, glaucoma-had surgery to repair, MI (20+ years ago per pt), hyperthyroidism (per chart but pt states it's hypo), HTN, sleep apnea    Examination-Activity Limitations Bed Mobility;Bend;Carry;Continence;Toileting;Stand;Stairs;Squat;Locomotion Level;Transfers    Examination-Participation Restrictions Driving;Meal Prep;Interpersonal Relationship;Laundry    Stability/Clinical Decision Making Evolving/Moderate complexity    Rehab Potential Good    PT Frequency --   2x/week for 6 weeks and then 1x/week for 2 weeks based on VA visit limit of 15.   PT Duration --   2x/week for 6 weeks and then  1x/week for 2 weeks based on VA visit limit of 15.   PT Treatment/Interventions ADLs/Self Care Home Management;Aquatic Therapy;Biofeedback;Canalith Repostioning;DME Instruction;Balance training;Therapeutic exercise;Therapeutic activities;Functional mobility training;Stair training;Gait training;Neuromuscular re-education;Patient/family education;Orthotic Fit/Training;Manual techniques;Vestibular    PT Next Visit Plan She got a new brace but it was too big so they ordered a smaller one for her.  I talked to her about aquatic and she was interested (it was already listed in the [facility]); I put a referral on Suzanne's desk - may need to follow up on it.  Continue to work towards patient being able to negotiate stairs instead of ramp.  Warm up on Nustep    PT Home Exercise Plan Medbridge: [email or web address]    Consulted and Agree with Plan of Care Patient           Patient will benefit from  skilled therapeutic intervention in order to improve the following deficits and impairments:  Abnormal gait,Decreased range of motion,Difficulty walking,Dizziness,Decreased knowledge of precautions,Decreased endurance,Decreased balance,Decreased knowledge of use of DME,Decreased mobility,Decreased strength,Increased edema,Postural dysfunction,Impaired flexibility,Pain  Visit Diagnosis: Muscle weakness (generalized)  Other disturbances of skin sensation  Unsteadiness on feet  Other abnormalities of gait and mobility  Chronic pain of both knees     Problem List Patient Active Problem List   Diagnosis Date Noted  . 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 Status post shoulder replacement 06/10/2017    Rico Junker, PT, DPT 04/11/20    5:10 PM    [facility] [address] [place], [place], [address] Phone: [phone]   Fax:  [phone]  Name: Katrina Hughes MRN: [id number] Date of Birth: 11-16-1950

## 2020-04-13 ENCOUNTER — Other Ambulatory Visit: Payer: Self-pay

## 2020-04-13 ENCOUNTER — Ambulatory Visit: Payer: No Typology Code available for payment source | Admitting: Occupational Therapy

## 2020-04-13 ENCOUNTER — Ambulatory Visit: Payer: No Typology Code available for payment source

## 2020-04-13 DIAGNOSIS — M25642 Stiffness of left hand, not elsewhere classified: Secondary | ICD-10-CM

## 2020-04-13 DIAGNOSIS — R2681 Unsteadiness on feet: Secondary | ICD-10-CM

## 2020-04-13 DIAGNOSIS — M6281 Muscle weakness (generalized): Secondary | ICD-10-CM

## 2020-04-13 DIAGNOSIS — R2689 Other abnormalities of gait and mobility: Secondary | ICD-10-CM

## 2020-04-13 NOTE — Therapy (Signed)
[facility] [address] [place], [place], [address] Phone: [phone]   Fax:  [phone]  Physical Therapy Treatment  Patient Details  Name: Katrina Hughes MRN: [id number] Date of Birth: December 30, 1950 Referring Provider (PT): Gomez Cleverly, MD   Encounter Date: 04/13/2020   PT End of Session - 04/13/20 1407    Visit Number 8    Number of Visits 15    Date for PT Re-Evaluation 05/09/20    Authorization Type VA, 15 visits    Authorization Time Period 02/02/20-06/01/20    Authorization - Visit Number 8    Authorization - Number of Visits 15    PT Start Time 0277    PT Stop Time 1445    PT Time Calculation (min) 43 min    Activity Tolerance Patient tolerated treatment well    Behavior During Therapy [facility] for tasks assessed/performed           Past Medical History:  Diagnosis Date  . Anginal pain ([place])   . Bell palsy   . CHF (congestive heart failure) ([place])   . Chronic kidney disease   . Complication of anesthesia    slow to wake up  . Diabetes mellitus without complication ([place])   . Dyspnea   . Environmental and seasonal allergies   . GERD (gastroesophageal reflux disease)   . Glaucoma   . Hypertension   . Hyperthyroidism    pt states that it's hypothyroidism  . Myocardial infarction ([place])   . Sleep apnea     Past Surgical History:  Procedure Laterality Date  . ABDOMINAL HYSTERECTOMY    . APPENDECTOMY    . BICEPT TENODESIS Left 06/10/2017   Procedure: BICEPS TENODESIS;  Surgeon: Leim Fabry, MD;  Location: [facility];  Service: Orthopedics;  Laterality: Left;  . BREAST REDUCTION SURGERY    . CATARACT EXTRACTION, BILATERAL Bilateral   . CHOLECYSTECTOMY    . CORONARY ANGIOPLASTY WITH STENT PLACEMENT    . HERNIA REPAIR    . NASAL HEMORRHAGE CONTROL N/A 09/17/2017   Procedure: EPISTAXIS CONTROL;  Surgeon: [name], MD;  Location: [facility];  Service: ENT;  Laterality: N/A;  . REVERSE SHOULDER  ARTHROPLASTY Left 06/10/2017   Procedure: REVERSE SHOULDER ARTHROPLASTY;  Surgeon: Leim Fabry, MD;  Location: [facility];  Service: Orthopedics;  Laterality: Left;  . REVERSE SHOULDER ARTHROPLASTY Right 11/03/2018   Procedure: REVERSE SHOULDER ARTHROPLASTY, BICEPS TENODESIS;  Surgeon: Leim Fabry, MD;  Location: [facility];  Service: Orthopedics;  Laterality: Right;    There were no vitals filed for this visit.   Subjective Assessment - 04/13/20 1406    Subjective Patient reports that she has still not recieved the brace yet. But knee is feeling good today, and reports swelling is down.    Pertinent History Anginal pain, L Bell palsy, CHF, CKD, DM, dyspnea, seasonal allergies, GERD, glaucoma-had surgery to repair, MI (20+ years ago per pt), hyperthyroidism (per chart but pt states it's hypo), HTN, sleep apnea    Patient Stated Goals To go to grocery store without having to ride electric scooter, improve balance to walk further, and decr. pain    Currently in Pain? No/denies    Pain Onset More than a month ago                 [facility] Adult PT Treatment/Exercise - 04/13/20 0001      Transfers   Transfers Sit to Stand;Stand to Sit    Sit to Stand 6: Modified independent (Device/Increase  time)    Stand to Sit 6: Modified independent (Device/Increase time)      Ambulation/Gait   Ambulation/Gait Yes    Ambulation/Gait Assistance 5: Supervision    Ambulation/Gait Assistance Details throughout therapy gym with activities    Assistive device None    Gait Pattern Step-through pattern;Decreased stride length;Decreased stance time - left;Antalgic    Ambulation Surface Level;Indoor      High Level Balance   High Level Balance Activities Side stepping;Marching forwards    High Level Balance Comments in // bars with intermittent UE support completed lateral sidestepping x 2 laps down and back, verbal cues to avoid dragging LLE when trailing. completed alternating marching x 2 laps down and back.       Knee/Hip Exercises: Stretches   Passive Hamstring Stretch Both;2 reps;20 seconds;Limitations    Passive Hamstring Stretch Limitations patient only able to tolerate about 20 seconds on LLE, but able to tolerate 30 seconds on RLE.      Knee/Hip Exercises: Aerobic   Nustep Completed NuStep x Level 4 with BLE and RUE x 6 minutes for lower extremity strengthening, endurance, and endurance prior to exercises.      Knee/Hip Exercises: Standing   Heel Raises Both;1 set;10 reps;Limitations    Heel Raises Limitations completed in // bars with light UE support    Hip Abduction Stengthening;Both;1 set;10 reps;Knee straight;Limitations    Abduction Limitations completed in // bars with light UE support    Hip Extension Stengthening;Both;1 set;10 reps;Knee straight;Limitations    Extension Limitations completed in // bars with light UE support    Forward Step Up Both;1 set;10 reps;Hand Hold: 1;Step Height: 4"    Forward Step Up Limitations completed with single UE support x 10 reps bilaterally to continue to work on improved strengthening and prepare for stair negotiation    Other Standing Knee Exercises standing on firm surface completed alternating toe taps with alternating BLE x 10 reps, continue to provide verbal cues for improved hip/knee flexion.                  PT Education - 04/13/20 1412    Education Details updated schedule    Person(s) Educated Patient    Methods Explanation    Comprehension Verbalized understanding            PT Short Term Goals - 03/30/20 1409      PT SHORT TERM GOAL #1   Title Pt will be IND with HEP to improve pain, balance, flexibility and strength. TARGET DATE FOR ALL STGS: 04/07/20    Baseline Patient reports independence with HEP, completed 2x/daily.    Time 4    Period Weeks    Status Achieved      PT SHORT TERM GOAL #2   Title Perform BERG and write goals as indicated.    Time 4    Period Weeks    Status Achieved      PT SHORT TERM  GOAL #3   Title Pt will improve gait speed with LRAD to >/=1.22ft/sec. to decr. falls risk.    Time 4    Period Weeks    Status Achieved      PT SHORT TERM GOAL #4   Title Pt will report knee pain decr. to 7/10 at worst, on average to improve functional mobility.    Baseline gets up to 8/10 with alot of walking per patient reports    Time 4    Period Weeks    Status On-going  PT SHORT TERM GOAL #5   Title Pt will amb. 300' at MOD I level with LRAD, with <7/10 knee pan to improve functional mobility.    Baseline 345' with Rollator, 6/10 pain    Time 4    Period Weeks    Status Achieved      PT SHORT TERM GOAL #6   Title Pt will improve BERG balance score to >/=49/56 in order to indicate dec fall risk.    Baseline 46/56    Time 4    Period Weeks    Status On-going             PT Long Term Goals - 03/14/20 1531      PT LONG TERM GOAL #1   Title Pt will report no falls in last four weeks to improve safety. TARGET DATE FOR ALL LTGS: 05/02/20    Time 8    Period Weeks    Status New      PT LONG TERM GOAL #2   Title Pt will amb. 500' in/outdoors at MOD I level with LRAD with </=5/10 knee pain to improve functional mobility.    Time 8    Period Weeks    Status New      PT LONG TERM GOAL #3   Title Pt will be able to attempt bowling in order to improve QOL.    Time 8    Period Weeks    Status New      PT LONG TERM GOAL #4   Title Pt will report ability to grocery shop with LRAD and no LOB or incr. pain to perform IADLs.    Time 8    Period Weeks    Status New      PT LONG TERM GOAL #5   Title Pt will score >/=53/56 on BERG balance (and perform DGI/FGA as able) in order to indicate dec fall risk.    Time 8    Period Weeks    Status New                 Plan - 04/13/20 1451    Clinical Impression Statement Continued activities to promote improved strength in BLE, and activites to work towards improved stair negotiation. Patient tolerating all activites  well with no increase in pain today, with intermitent rest breaks. No update on aquatic therapy at this time. Will continue to progress toward all LTGs.    Personal Factors and Comorbidities Age;Fitness;Comorbidity 3+    Comorbidities Anginal pain, L Bell palsy, CHF, CKD, DM, dyspnea, seasonal allergies, GERD, glaucoma-had surgery to repair, MI (20+ years ago per pt), hyperthyroidism (per chart but pt states it's hypo), HTN, sleep apnea    Examination-Activity Limitations Bed Mobility;Bend;Carry;Continence;Toileting;Stand;Stairs;Squat;Locomotion Level;Transfers    Examination-Participation Restrictions Driving;Meal Prep;Interpersonal Relationship;Laundry    Stability/Clinical Decision Making Evolving/Moderate complexity    Rehab Potential Good    PT Frequency --   2x/week for 6 weeks and then 1x/week for 2 weeks based on VA visit limit of 15.   PT Duration --   2x/week for 6 weeks and then 1x/week for 2 weeks based on VA visit limit of 15.   PT Treatment/Interventions ADLs/Self Care Home Management;Aquatic Therapy;Biofeedback;Canalith Repostioning;DME Instruction;Balance training;Therapeutic exercise;Therapeutic activities;Functional mobility training;Stair training;Gait training;Neuromuscular re-education;Patient/family education;Orthotic Fit/Training;Manual techniques;Vestibular    PT Next Visit Plan She got a new brace but it was too big so they ordered a smaller one for her.  I talked to her about aquatic and she was interested (it  was already listed in the POC); I put a referral on Suzanne's desk - any update on aquatic referal.  Continue to work towards patient being able to negotiate stairs instead of ramp.  Warm up on Nustep    PT Home Exercise Plan [employer]: [id number]    Consulted and Agree with Plan of Care Patient           Patient will benefit from skilled therapeutic intervention in order to improve the following deficits and impairments:  Abnormal gait,Decreased range of  motion,Difficulty walking,Dizziness,Decreased knowledge of precautions,Decreased endurance,Decreased balance,Decreased knowledge of use of DME,Decreased mobility,Decreased strength,Increased edema,Postural dysfunction,Impaired flexibility,Pain  Visit Diagnosis: Unsteadiness on feet  Other abnormalities of gait and mobility  Muscle weakness (generalized)     Problem List Patient Active Problem List   Diagnosis Date Noted  . 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 Status post shoulder replacement 06/10/2017    Jones Bales, PT, DPT 04/13/2020, 2:53 PM  [facility] [address] [place], [place], [address] Phone: [phone]   Fax:  [phone]  Name: Makiyla Linch MRN: [id number] Date of Birth: Oct 02, 1950

## 2020-04-13 NOTE — Therapy (Signed)
[facility] [address] [place], [place], [address] Phone: [phone]   Fax:  [phone]  Occupational Therapy Treatment  Patient Details  Name: Katrina Hughes MRN: [id number] Date of Birth: September 10, 1950 No data recorded  Encounter Date: 04/13/2020   OT End of Session - 04/13/20 1408    Visit Number 7    Number of Visits 15    Date for OT Re-Evaluation 04/28/20    Authorization Type VA - Approved 15 visits (already used 1 at the [place] for O.T. )    Authorization Time Period 02/02/20 - 06/01/20    Authorization - Visit Number 8    Authorization - Number of Visits 15    OT Start Time 1315    OT Stop Time 1400    OT Time Calculation (min) 45 min    Activity Tolerance Patient tolerated treatment well    Behavior During Therapy [facility] for tasks assessed/performed           Past Medical History:  Diagnosis Date  . Anginal pain ([place])   . Bell palsy   . CHF (congestive heart failure) ([place])   . Chronic kidney disease   . Complication of anesthesia    slow to wake up  . Diabetes mellitus without complication ([place])   . Dyspnea   . Environmental and seasonal allergies   . GERD (gastroesophageal reflux disease)   . Glaucoma   . Hypertension   . Hyperthyroidism    pt states that it's hypothyroidism  . Myocardial infarction ([place])   . Sleep apnea     Past Surgical History:  Procedure Laterality Date  . ABDOMINAL HYSTERECTOMY    . APPENDECTOMY    . BICEPT TENODESIS Left 06/10/2017   Procedure: BICEPS TENODESIS;  Surgeon: Leim Fabry, MD;  Location: [facility];  Service: Orthopedics;  Laterality: Left;  . BREAST REDUCTION SURGERY    . CATARACT EXTRACTION, BILATERAL Bilateral   . CHOLECYSTECTOMY    . CORONARY ANGIOPLASTY WITH STENT PLACEMENT    . HERNIA REPAIR    . NASAL HEMORRHAGE CONTROL N/A 09/17/2017   Procedure: EPISTAXIS CONTROL;  Surgeon: [name], MD;  Location: [facility];  Service: ENT;  Laterality: N/A;  .  REVERSE SHOULDER ARTHROPLASTY Left 06/10/2017   Procedure: REVERSE SHOULDER ARTHROPLASTY;  Surgeon: Leim Fabry, MD;  Location: [facility];  Service: Orthopedics;  Laterality: Left;  . REVERSE SHOULDER ARTHROPLASTY Right 11/03/2018   Procedure: REVERSE SHOULDER ARTHROPLASTY, BICEPS TENODESIS;  Surgeon: Leim Fabry, MD;  Location: [facility];  Service: Orthopedics;  Laterality: Right;    There were no vitals filed for this visit.   Subjective Assessment - 04/13/20 1336    Subjective  I see Dr. Amedeo Plenty on 04/26/20    Pertinent History displaced fx of proximal phalanx Lt small finger after fall on 12/10/19    Currently in Pain? No/denies    Pain Onset More than a month ago    Pain Onset More than a month ago           Ultrasound x 8 min to medial proximal elbow at muscle attachments 50% pulsed, 3 Mhz, 0.8 wts/cm2. Pt now has no pain upon palpation or with wrist flexion.  Assessed STG's and progress to date - see goal section. Pt has improved with hand ROM, grip strength, and pain/numbness.  Discussed POC going forward and placing on hold until after pt sees MD on 04/26/20. Pt in agreement.  P/ROM in full composite flexion Lt hand w/ focus on  Lt small finger. Reviewed proper donning/doffing of buddy strap. Reviewed putty HEP - pt did each as indicated                       OT Short Term Goals - 04/13/20 1409      OT SHORT TERM GOAL #1   Title Independent with initial HEP    Time 3    Period Weeks    Status Achieved      OT SHORT TERM GOAL #2   Title Pt to demo 75% composite finger flexion in prep for better grasping    Baseline 40%    Time 3    Period Weeks    Status Achieved   80-85%     OT SHORT TERM GOAL #3   Title Pt to report decreased numbness and pain along ulnar n. distribution and at medial elbow w/ wrist flexion    Time 3    Period Weeks    Status Achieved      OT SHORT TERM GOAL #4   Title Pt to improve grip strength Lt hand to 15 lbs    Baseline 9 lbs     Time 3    Period Weeks    Status Achieved   04/13/20: 22.7 lbs, 25.3 lbs            OT Long Term Goals - 03/10/20 1552      OT LONG TERM GOAL #1   Title Independent with updated HEP    Time 7    Period Weeks    Status New      OT LONG TERM GOAL #2   Title Pt to demo 90% or greater full composite flexion Lt hand    Time 7    Period Weeks    Status New      OT LONG TERM GOAL #3   Title Pt to demo 20 lbs or greater grip strength Lt hand    Baseline 9 lbs    Time 7    Period Weeks    Status New      OT LONG TERM GOAL #4   Title Pt to consistently use Lt hand as assist for bilateral tasks    Time 7    Period Weeks    Status New                 Plan - 04/13/20 1410    Clinical Impression Statement Pt has met all STG's. Pt has improved in grip strength, pain and numbness, and hand ROM. Pt sees Dr. Amedeo Plenty on 04/26/20 and O.T. has placed pt on hold until she sees him. Note wrote today for MD with concerns re: possible medial epicondylitis and possible ulnar. n involvement from fall - however these symptoms have much improved at this time.    OT Occupational Profile and History Detailed Assessment- Review of Records and additional review of physical, cognitive, psychosocial history related to current functional performance    Occupational performance deficits (Please refer to evaluation for details): IADL's    Body Structure / Function / Physical Skills Strength;Pain;Dexterity;Edema;UE functional use;IADL;ROM;Flexibility;Sensation;Coordination    Rehab Potential Good    Clinical Decision Making Several treatment options, min-mod task modification necessary    Comorbidities Affecting Occupational Performance: May have comorbidities impacting occupational performance    Modification or Assistance to Complete Evaluation  No modification of tasks or assist necessary to complete eval    OT Frequency 2x / week  OT Duration --   for 7 weeks   OT Treatment/Interventions  Self-care/ADL training;Moist Heat;Fluidtherapy;DME and/or AE instruction;Splinting;Therapeutic activities;Ultrasound;Therapeutic exercise;Cryotherapy;Neuromuscular education;Functional Mobility Training;Passive range of motion;Electrical Stimulation;Paraffin;Manual Therapy;Patient/family education    Plan progress as able following MD appointment with hand ortho specialist    Consulted and Agree with Plan of Care Patient    Family Member Consulted wife           Patient will benefit from skilled therapeutic intervention in order to improve the following deficits and impairments:   Body Structure / Function / Physical Skills: Strength,Pain,Dexterity,Edema,UE functional use,IADL,ROM,Flexibility,Sensation,Coordination       Visit Diagnosis: Stiffness of left hand, not elsewhere classified  Muscle weakness (generalized)    Problem List Patient Active Problem List   Diagnosis Date Noted  . 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 Status post shoulder replacement 06/10/2017    Carey Bullocks, OTR/L 04/13/2020, 2:14 PM  [facility] [address] [place], [place], [address] Phone: [phone]   Fax:  [phone]  Name: Kristia Jupiter MRN: [id number] Date of Birth: April 20, 1950

## 2020-04-18 ENCOUNTER — Ambulatory Visit: Payer: No Typology Code available for payment source | Admitting: Occupational Therapy

## 2020-04-18 ENCOUNTER — Ambulatory Visit: Payer: No Typology Code available for payment source | Admitting: Rehabilitation

## 2020-04-20 ENCOUNTER — Ambulatory Visit: Payer: No Typology Code available for payment source | Admitting: Rehabilitation

## 2020-04-20 ENCOUNTER — Ambulatory Visit: Payer: No Typology Code available for payment source | Admitting: Occupational Therapy

## 2020-04-25 ENCOUNTER — Ambulatory Visit: Payer: No Typology Code available for payment source

## 2020-04-25 ENCOUNTER — Ambulatory Visit: Payer: No Typology Code available for payment source | Admitting: Occupational Therapy

## 2020-04-27 ENCOUNTER — Encounter: Payer: Self-pay | Admitting: Occupational Therapy

## 2020-04-27 ENCOUNTER — Ambulatory Visit: Payer: No Typology Code available for payment source | Admitting: Physical Therapy

## 2020-04-27 ENCOUNTER — Other Ambulatory Visit: Payer: Self-pay

## 2020-04-27 ENCOUNTER — Ambulatory Visit: Payer: No Typology Code available for payment source | Admitting: Occupational Therapy

## 2020-04-27 DIAGNOSIS — M6281 Muscle weakness (generalized): Secondary | ICD-10-CM

## 2020-04-27 DIAGNOSIS — M25642 Stiffness of left hand, not elsewhere classified: Secondary | ICD-10-CM

## 2020-04-27 DIAGNOSIS — R2681 Unsteadiness on feet: Secondary | ICD-10-CM

## 2020-04-27 DIAGNOSIS — R2689 Other abnormalities of gait and mobility: Secondary | ICD-10-CM

## 2020-04-27 DIAGNOSIS — R208 Other disturbances of skin sensation: Secondary | ICD-10-CM

## 2020-04-27 NOTE — Therapy (Signed)
[facility] [address] [place], [place], [address] Phone: [phone]   Fax:  [phone]  Physical Therapy Treatment  Patient Details  Name: Katrina Hughes MRN: [id number] Date of Birth: 11-May-1950 Referring Provider (PT): Gomez Cleverly, MD   Encounter Date: 04/27/2020   PT End of Session - 04/27/20 1323    Visit Number 9    Number of Visits 15    Date for PT Re-Evaluation 05/09/20    Authorization Type VA, 15 visits    Authorization Time Period 02/02/20-06/01/20    Authorization - Visit Number 9    Authorization - Number of Visits 15    PT Start Time 5188    PT Stop Time 1405    PT Time Calculation (min) 42 min    Equipment Utilized During Treatment Other (comment)   L knee brace   Activity Tolerance Patient tolerated treatment well    Behavior During Therapy [facility] for tasks assessed/performed           Past Medical History:  Diagnosis Date  . Anginal pain ([place])   . Bell palsy   . CHF (congestive heart failure) ([place])   . Chronic kidney disease   . Complication of anesthesia    slow to wake up  . Diabetes mellitus without complication ([place])   . Dyspnea   . Environmental and seasonal allergies   . GERD (gastroesophageal reflux disease)   . Glaucoma   . Hypertension   . Hyperthyroidism    pt states that it's hypothyroidism  . Myocardial infarction ([place])   . Sleep apnea     Past Surgical History:  Procedure Laterality Date  . ABDOMINAL HYSTERECTOMY    . APPENDECTOMY    . BICEPT TENODESIS Left 06/10/2017   Procedure: BICEPS TENODESIS;  Surgeon: Leim Fabry, MD;  Location: [facility];  Service: Orthopedics;  Laterality: Left;  . BREAST REDUCTION SURGERY    . CATARACT EXTRACTION, BILATERAL Bilateral   . CHOLECYSTECTOMY    . CORONARY ANGIOPLASTY WITH STENT PLACEMENT    . HERNIA REPAIR    . NASAL HEMORRHAGE CONTROL N/A 09/17/2017   Procedure: EPISTAXIS CONTROL;  Surgeon: [name], MD;   Location: [facility];  Service: ENT;  Laterality: N/A;  . REVERSE SHOULDER ARTHROPLASTY Left 06/10/2017   Procedure: REVERSE SHOULDER ARTHROPLASTY;  Surgeon: Leim Fabry, MD;  Location: [facility];  Service: Orthopedics;  Laterality: Left;  . REVERSE SHOULDER ARTHROPLASTY Right 11/03/2018   Procedure: REVERSE SHOULDER ARTHROPLASTY, BICEPS TENODESIS;  Surgeon: Leim Fabry, MD;  Location: [facility];  Service: Orthopedics;  Laterality: Right;    There were no vitals filed for this visit.   Subjective Assessment - 04/27/20 1325    Subjective Pt states she is feeling fine today. No falls or anything new to report.    Patient is accompained by: Family member    Pertinent History Anginal pain, L Bell palsy, CHF, CKD, DM, dyspnea, seasonal allergies, GERD, glaucoma-had surgery to repair, MI (20+ years ago per pt), hyperthyroidism (per chart but pt states it's hypo), HTN, sleep apnea    How long can you stand comfortably? 20-25 minutes    How long can you walk comfortably? Unsure of time but stated about 54' before L knee pain incr.    Patient Stated Goals To go to grocery store without having to ride electric scooter, improve balance to walk further, and decr. pain    Currently in Pain? Yes    Pain Score 6     Pain  Location Knee    Pain Orientation Left              [facility] PT Assessment - 04/27/20 0001      Berg Balance Test   Sit to Stand Able to stand without using hands and stabilize independently    Standing Unsupported Able to stand safely 2 minutes    Sitting with Back Unsupported but Feet Supported on Floor or Stool Able to sit safely and securely 2 minutes    Stand to Sit Sits safely with minimal use of hands    Transfers Able to transfer safely, minor use of hands    Standing Unsupported with Eyes Closed Able to stand 10 seconds safely    Standing Unsupported with Feet Together Able to place feet together independently and stand 1 minute safely    From Standing, Reach Forward with  Outstretched Arm Can reach confidently >25 cm (10")    From Standing Position, Pick up Object from Floor Able to pick up shoe safely and easily    From Standing Position, Turn to Look Behind Over each Shoulder Looks behind from both sides and weight shifts well    Turn 360 Degrees Able to turn 360 degrees safely but slowly    Standing Unsupported, Alternately Place Feet on Step/Stool Able to stand independently and safely and complete 8 steps in 20 seconds    Standing Unsupported, One Foot in Front Able to place foot tandem independently and hold 30 seconds    Standing on One Leg Able to lift leg independently and hold 5-10 seconds    Total Score 53    Berg comment: 53/56                         [facility] Adult PT Treatment/Exercise - 04/27/20 0001      Knee/Hip Exercises: Aerobic   Nustep Completed NuStep x Level 4 with BLE x 5 minutes for lower extremity strengthening, endurance, and endurance prior to exercises.      Knee/Hip Exercises: Machines for Strengthening   Total Gym Leg Press 50# bilat 2x10      Knee/Hip Exercises: Standing   Heel Raises Both;1 set;10 reps;Limitations    Hip Abduction Stengthening;Both;1 set;10 reps;Knee straight;Limitations    Abduction Limitations with red tband    Hip Extension Stengthening;Both;1 set;10 reps;Knee straight;Limitations    Extension Limitations with red tband    Forward Step Up Both;1 set;10 reps;Hand Hold: 1;Step Height: 4"    Forward Step Up Limitations Runner's step up; Intermittent UE support to continue to work on improved strengthening and prepare for stair negotiation               Balance Exercises - 04/27/20 0001      Balance Exercises: Standing   SLS with Vectors Solid surface   x10 tapping 2 cones forward; x10 tapping cone forward & back; x10 tapping forward, side & back              PT Short Term Goals - 03/30/20 1409      PT SHORT TERM GOAL #1   Title Pt will be IND with HEP to improve pain,  balance, flexibility and strength. TARGET DATE FOR ALL STGS: 04/07/20    Baseline Patient reports independence with HEP, completed 2x/daily.    Time 4    Period Weeks    Status Achieved      PT SHORT TERM GOAL #2   Title Perform BERG and write goals  as indicated.    Time 4    Period Weeks    Status Achieved      PT SHORT TERM GOAL #3   Title Pt will improve gait speed with LRAD to >/=1.99ft/sec. to decr. falls risk.    Time 4    Period Weeks    Status Achieved      PT SHORT TERM GOAL #4   Title Pt will report knee pain decr. to 7/10 at worst, on average to improve functional mobility.    Baseline gets up to 8/10 with alot of walking per patient reports    Time 4    Period Weeks    Status On-going      PT SHORT TERM GOAL #5   Title Pt will amb. 300' at MOD I level with LRAD, with <7/10 knee pan to improve functional mobility.    Baseline 345' with Rollator, 6/10 pain    Time 4    Period Weeks    Status Achieved      PT SHORT TERM GOAL #6   Title Pt will improve BERG balance score to >/=49/56 in order to indicate dec fall risk.    Baseline 46/56    Time 4    Period Weeks    Status On-going             PT Long Term Goals - 04/27/20 1353      PT LONG TERM GOAL #1   Title Pt will report no falls in last four weeks to improve safety. TARGET DATE FOR ALL LTGS: 05/02/20    Time 8    Period Weeks    Status New      PT LONG TERM GOAL #2   Title Pt will amb. 500' in/outdoors at MOD I level with LRAD with </=5/10 knee pain to improve functional mobility.    Time 8    Period Weeks    Status New      PT LONG TERM GOAL #3   Title Pt will be able to attempt bowling in order to improve QOL.    Time 8    Period Weeks    Status New      PT LONG TERM GOAL #4   Title Pt will report ability to grocery shop with LRAD and no LOB or incr. pain to perform IADLs.    Time 8    Period Weeks    Status New      PT LONG TERM GOAL #5   Title Pt will score >/=53/56 on BERG balance  (and perform DGI/FGA as able) in order to indicate dec fall risk.    Time 8    Period Weeks    Status Achieved                 Plan - 04/27/20 1350    Clinical Impression Statement Treatment continues to focus on improving bilat LE strength, single leg stance and balance. Pt states she was called about aquatics but has not been scheduled. Pt has met her Sharlene Motts Balance Score goal. Pt continues to progress well towards her goals.    Personal Factors and Comorbidities Age;Fitness;Comorbidity 3+    Comorbidities Anginal pain, L Bell palsy, CHF, CKD, DM, dyspnea, seasonal allergies, GERD, glaucoma-had surgery to repair, MI (20+ years ago per pt), hyperthyroidism (per chart but pt states it's hypo), HTN, sleep apnea    Examination-Activity Limitations Bed Mobility;Bend;Carry;Continence;Toileting;Stand;Stairs;Squat;Locomotion Level;Transfers    Examination-Participation Restrictions Driving;Meal Prep;Interpersonal Relationship;Laundry    Stability/Clinical  Decision Making Evolving/Moderate complexity    Rehab Potential Good    PT Frequency --   2x/week for 6 weeks and then 1x/week for 2 weeks based on VA visit limit of 15.   PT Duration --   2x/week for 6 weeks and then 1x/week for 2 weeks based on VA visit limit of 15.   PT Treatment/Interventions ADLs/Self Care Home Management;Aquatic Therapy;Biofeedback;Canalith Repostioning;DME Instruction;Balance training;Therapeutic exercise;Therapeutic activities;Functional mobility training;Stair training;Gait training;Neuromuscular re-education;Patient/family education;Orthotic Fit/Training;Manual techniques;Vestibular    PT Next Visit Plan She got a new brace but it was too big so they ordered a smaller one for her. Any updates on aquatics? Continue to work towards patient being able to negotiate stairs instead of ramp.  Warm up on Nustep, strengthening and balance    PT Home Exercise Plan Medbridge: [id number]    Consulted and Agree with Plan of Care  Patient           Patient will benefit from skilled therapeutic intervention in order to improve the following deficits and impairments:  Abnormal gait,Decreased range of motion,Difficulty walking,Dizziness,Decreased knowledge of precautions,Decreased endurance,Decreased balance,Decreased knowledge of use of DME,Decreased mobility,Decreased strength,Increased edema,Postural dysfunction,Impaired flexibility,Pain  Visit Diagnosis: Muscle weakness (generalized)  Unsteadiness on feet  Other abnormalities of gait and mobility     Problem List Patient Active Problem List   Diagnosis Date Noted  . 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 Status post shoulder replacement 06/10/2017    [facility] [place] PT, DPT 04/27/2020, 2:08 PM  [facility] [address] [place], [place], [address] Phone: [phone]   Fax:  [phone]  Name: Katrina Hughes MRN: [id number] Date of Birth: 11-24-1950

## 2020-04-27 NOTE — Therapy (Signed)
[facility] [address] [place], [place], [address] Phone: [phone]   Fax:  [phone]  Occupational Therapy Treatment  Patient Details  Name: Katrina Hughes MRN: [id number] Date of Birth: 1950/12/31 No data recorded  Encounter Date: 04/27/2020   OT End of Session - 04/27/20 1239    Visit Number 8    Number of Visits 15    Date for OT Re-Evaluation 04/28/20    Authorization Type VA - Approved 15 visits (already used 1 at the [place] for O.T. )    Authorization Time Period 02/02/20 - 06/01/20    Authorization - Visit Number 9    Authorization - Number of Visits 15    OT Start Time 1093    OT Stop Time 1315    OT Time Calculation (min) 40 min    Activity Tolerance Patient tolerated treatment well    Behavior During Therapy [facility] for tasks assessed/performed           Past Medical History:  Diagnosis Date  . Anginal pain ([place])   . Bell palsy   . CHF (congestive heart failure) ([place])   . Chronic kidney disease   . Complication of anesthesia    slow to wake up  . Diabetes mellitus without complication ([place])   . Dyspnea   . Environmental and seasonal allergies   . GERD (gastroesophageal reflux disease)   . Glaucoma   . Hypertension   . Hyperthyroidism    pt states that it's hypothyroidism  . Myocardial infarction ([place])   . Sleep apnea     Past Surgical History:  Procedure Laterality Date  . ABDOMINAL HYSTERECTOMY    . APPENDECTOMY    . BICEPT TENODESIS Left 06/10/2017   Procedure: BICEPS TENODESIS;  Surgeon: Leim Fabry, MD;  Location: [facility];  Service: Orthopedics;  Laterality: Left;  . BREAST REDUCTION SURGERY    . CATARACT EXTRACTION, BILATERAL Bilateral   . CHOLECYSTECTOMY    . CORONARY ANGIOPLASTY WITH STENT PLACEMENT    . HERNIA REPAIR    . NASAL HEMORRHAGE CONTROL N/A 09/17/2017   Procedure: EPISTAXIS CONTROL;  Surgeon: [name], MD;  Location: [facility];  Service: ENT;  Laterality: N/A;  .  REVERSE SHOULDER ARTHROPLASTY Left 06/10/2017   Procedure: REVERSE SHOULDER ARTHROPLASTY;  Surgeon: Leim Fabry, MD;  Location: [facility];  Service: Orthopedics;  Laterality: Left;  . REVERSE SHOULDER ARTHROPLASTY Right 11/03/2018   Procedure: REVERSE SHOULDER ARTHROPLASTY, BICEPS TENODESIS;  Surgeon: Leim Fabry, MD;  Location: [facility];  Service: Orthopedics;  Laterality: Right;    There were no vitals filed for this visit.   Subjective Assessment - 04/27/20 1238    Subjective  Pt reports that MD cancelled her appt and re-scheduled    Pertinent History displaced fx of proximal phalanx Lt small finger after fall on 12/10/19    Limitations none    Currently in Pain? Yes    Pain Score 3     Pain Location Hand    Pain Orientation Left    Pain Descriptors / Indicators Aching    Pain Type Chronic pain    Pain Onset More than a month ago    Pain Frequency Intermittent    Aggravating Factors  use    Pain Relieving Factors rest                   Fluidotherapy x 10 mins for pain and stiffness. No adverse reactions A/ROM MP flexion/ extension with buddy strap, min-mod  facilitation and isolated PIP flexion/ extension for 5th digit and finger abduction/ adduction Reviewed yellow putty exercises for grip and sustained pinch, min v.c for proper performance Graded clothespins for sustained pinch and LUE functional use, min difficulty                OT Short Term Goals - 04/13/20 1409      OT SHORT TERM GOAL #1   Title Independent with initial HEP    Time 3    Period Weeks    Status Achieved      OT SHORT TERM GOAL #2   Title Pt to demo 75% composite finger flexion in prep for better grasping    Baseline 40%    Time 3    Period Weeks    Status Achieved   80-85%     OT SHORT TERM GOAL #3   Title Pt to report decreased numbness and pain along ulnar n. distribution and at medial elbow w/ wrist flexion    Time 3    Period Weeks    Status Achieved      OT SHORT TERM  GOAL #4   Title Pt to improve grip strength Lt hand to 15 lbs    Baseline 9 lbs    Time 3    Period Weeks    Status Achieved   04/13/20: 22.7 lbs, 25.3 lbs            OT Long Term Goals - 04/27/20 1242      OT LONG TERM GOAL #1   Title Independent with updated HEP    Time 7    Period Weeks    Status On-going      OT LONG TERM GOAL #2   Title Pt to demo 90% or greater full composite flexion Lt hand    Time 7    Period Weeks    Status On-going      OT LONG TERM GOAL #3   Title Pt to demo 20 lbs or greater grip strength Lt hand    Baseline 9 lbs    Time 7    Period Weeks    Status On-going      OT LONG TERM GOAL #4   Title Pt to consistently use Lt hand as assist for bilateral tasks    Time 7    Period Weeks    Status On-going                  Patient will benefit from skilled therapeutic intervention in order to improve the following deficits and impairments:           Visit Diagnosis: Muscle weakness (generalized)  Stiffness of left hand, not elsewhere classified  Other disturbances of skin sensation    Problem List Patient Active Problem List   Diagnosis Date Noted  . 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 Status post shoulder replacement 06/10/2017    Bravery Ketcham 04/27/2020, 1:10 PM  [facility] [address] [place], [place], [address] Phone: [phone]   Fax:  [phone]  Name: Katrina Hughes MRN: [id number] Date of Birth: 12/14/1950

## 2020-05-03 ENCOUNTER — Ambulatory Visit: Payer: No Typology Code available for payment source | Admitting: Occupational Therapy

## 2020-05-03 ENCOUNTER — Other Ambulatory Visit: Payer: Self-pay

## 2020-05-03 ENCOUNTER — Ambulatory Visit: Payer: No Typology Code available for payment source

## 2020-05-03 DIAGNOSIS — R2689 Other abnormalities of gait and mobility: Secondary | ICD-10-CM

## 2020-05-03 DIAGNOSIS — M6281 Muscle weakness (generalized): Secondary | ICD-10-CM

## 2020-05-03 DIAGNOSIS — M25642 Stiffness of left hand, not elsewhere classified: Secondary | ICD-10-CM

## 2020-05-03 DIAGNOSIS — R2681 Unsteadiness on feet: Secondary | ICD-10-CM

## 2020-05-03 NOTE — Therapy (Signed)
[facility] [address] [place], [place], [address] Phone: [phone]   Fax:  [phone]  Occupational Therapy Treatment  Patient Details  Name: Katrina Hughes MRN: [id number] Date of Birth: 10/26/1950 No data recorded  Encounter Date: 05/03/2020   OT End of Session - 05/03/20 1412    Visit Number 9    Number of Visits 15    Date for OT Re-Evaluation 04/28/20    Authorization Type VA - Approved 15 visits (already used 1 at the [place] for O.T. )    Authorization Time Period 02/02/20 - 06/01/20    Authorization - Visit Number 10    Authorization - Number of Visits 15    OT Start Time 1325   pt arrived late   OT Stop Time 1400    OT Time Calculation (min) 35 min    Activity Tolerance Patient tolerated treatment well    Behavior During Therapy [facility] for tasks assessed/performed           Past Medical History:  Diagnosis Date  . Anginal pain ([place])   . Bell palsy   . CHF (congestive heart failure) ([place])   . Chronic kidney disease   . Complication of anesthesia    slow to wake up  . Diabetes mellitus without complication ([place])   . Dyspnea   . Environmental and seasonal allergies   . GERD (gastroesophageal reflux disease)   . Glaucoma   . Hypertension   . Hyperthyroidism    pt states that it's hypothyroidism  . Myocardial infarction ([place])   . Sleep apnea     Past Surgical History:  Procedure Laterality Date  . ABDOMINAL HYSTERECTOMY    . APPENDECTOMY    . BICEPT TENODESIS Left 06/10/2017   Procedure: BICEPS TENODESIS;  Surgeon: Leim Fabry, MD;  Location: [facility];  Service: Orthopedics;  Laterality: Left;  . BREAST REDUCTION SURGERY    . CATARACT EXTRACTION, BILATERAL Bilateral   . CHOLECYSTECTOMY    . CORONARY ANGIOPLASTY WITH STENT PLACEMENT    . HERNIA REPAIR    . NASAL HEMORRHAGE CONTROL N/A 09/17/2017   Procedure: EPISTAXIS CONTROL;  Surgeon: [name], MD;  Location: [facility];  Service: ENT;   Laterality: N/A;  . REVERSE SHOULDER ARTHROPLASTY Left 06/10/2017   Procedure: REVERSE SHOULDER ARTHROPLASTY;  Surgeon: Leim Fabry, MD;  Location: [facility];  Service: Orthopedics;  Laterality: Left;  . REVERSE SHOULDER ARTHROPLASTY Right 11/03/2018   Procedure: REVERSE SHOULDER ARTHROPLASTY, BICEPS TENODESIS;  Surgeon: Leim Fabry, MD;  Location: [facility];  Service: Orthopedics;  Laterality: Right;    There were no vitals filed for this visit.   Subjective Assessment - 05/03/20 1334    Subjective  Pt reports that MD cancelled her appt and re-scheduled. She sees Dr. Amedeo Plenty tomorrow    Pertinent History displaced fx of proximal phalanx Lt small finger after fall on 12/10/19    Limitations none    Currently in Pain? Yes    Pain Score 5     Pain Location --   small finger   Pain Orientation Left    Pain Descriptors / Indicators Aching    Pain Type Chronic pain    Pain Onset More than a month ago    Pain Frequency Intermittent    Aggravating Factors  overuse    Pain Relieving Factors rest, heat            Pt arrived 10 min late. Pt sees Dr. Amedeo Plenty tomorrow (appt was rescheduled)  Fluidotherapy x 10 min Lt hand to decrease stiffness and pain.  Progressed to stretch #3 and #4 from medial epicondylitis program. Symptoms for this have resolved but stretches are still beneficial especially following repetitive gripping activities.  Upgraded putty to red resistance today and reviewed previously issued HEP's. Gripper set at level 2 resistance to pick up blocks for sustained grip strength Lt hand. Pt encouraged to take 1 rest break to stretch wrist/finger flexors.  Assessed progress towards LTG's and progress to date. Pt reports using Lt hand as assist for all bilateral tasks except lifting heavy pots/heavy items.  Pt still demo tightness w/ composite flex of Lt small finger and demo slight clawing vs. Extensor lag                       OT Short Term Goals - 04/13/20 1409       OT SHORT TERM GOAL #1   Title Independent with initial HEP    Time 3    Period Weeks    Status Achieved      OT SHORT TERM GOAL #2   Title Pt to demo 75% composite finger flexion in prep for better grasping    Baseline 40%    Time 3    Period Weeks    Status Achieved   80-85%     OT SHORT TERM GOAL #3   Title Pt to report decreased numbness and pain along ulnar n. distribution and at medial elbow w/ wrist flexion    Time 3    Period Weeks    Status Achieved      OT SHORT TERM GOAL #4   Title Pt to improve grip strength Lt hand to 15 lbs    Baseline 9 lbs    Time 3    Period Weeks    Status Achieved   04/13/20: 22.7 lbs, 25.3 lbs            OT Long Term Goals - 05/03/20 1356      OT LONG TERM GOAL #1   Title Independent with updated HEP    Time 7    Period Weeks    Status Achieved      OT LONG TERM GOAL #2   Title Pt to demo 90% or greater full composite flexion Lt hand    Time 7    Period Weeks    Status On-going      OT LONG TERM GOAL #3   Title Pt to demo 20 lbs or greater grip strength Lt hand    Baseline 9 lbs    Time 7    Period Weeks    Status On-going      OT LONG TERM GOAL #4   Title Pt to consistently use Lt hand as assist for bilateral tasks    Time 7    Period Weeks    Status Achieved                 Plan - 05/03/20 1410    Clinical Impression Statement Pt has met all STG's and most LTG's at this time. Pt has improved in symptoms but still tight with composite flexion Lt small finger and PIP extensor lag.    OT Occupational Profile and History Detailed Assessment- Review of Records and additional review of physical, cognitive, psychosocial history related to current functional performance    Occupational performance deficits (Please refer to evaluation for details): IADL's    Body Structure / Function /  Physical Skills Strength;Pain;Dexterity;Edema;UE functional use;IADL;ROM;Flexibility;Sensation;Coordination    Rehab Potential Good     Clinical Decision Making Several treatment options, min-mod task modification necessary    Comorbidities Affecting Occupational Performance: May have comorbidities impacting occupational performance    Modification or Assistance to Complete Evaluation  No modification of tasks or assist necessary to complete eval    OT Frequency 2x / week    OT Duration --   for 7 weeks   OT Treatment/Interventions Self-care/ADL training;Moist Heat;Fluidtherapy;DME and/or AE instruction;Splinting;Therapeutic activities;Ultrasound;Therapeutic exercise;Cryotherapy;Neuromuscular education;Functional Mobility Training;Passive range of motion;Electrical Stimulation;Paraffin;Manual Therapy;Patient/family education    Plan reassess grip strength, progress as able following MD appointment with hand ortho specialist, possible d/c next session    Consulted and Agree with Plan of Care Patient    Family Member Consulted wife           Patient will benefit from skilled therapeutic intervention in order to improve the following deficits and impairments:   Body Structure / Function / Physical Skills: Strength,Pain,Dexterity,Edema,UE functional use,IADL,ROM,Flexibility,Sensation,Coordination       Visit Diagnosis: Stiffness of left hand, not elsewhere classified  Muscle weakness (generalized)    Problem List Patient Active Problem List   Diagnosis Date Noted  . 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 Status post shoulder replacement 06/10/2017    Carey Bullocks, OTR/L 05/03/2020, 2:14 PM  [place] [facility] [address] [place], [place], [address] Phone: [phone]   Fax:  [phone]  Name: Katrina Hughes MRN: [id number] Date of Birth: 15-Jul-1950

## 2020-05-03 NOTE — Therapy (Signed)
[place] [address] [place], [place], [address] Phone: [phone]   Fax:  [phone]  Physical Therapy Treatment/Re-Certification  Patient Details  Name: Katrina Hughes MRN: [id number] Date of Birth: [birth year] Referring Provider (PT): Gomez Cleverly, MD   Encounter Date: 05/03/2020   PT End of Session - 05/03/20 1447    Visit Number 10    Number of Visits 19    Date for PT Re-Evaluation 07/02/20   POC for 6 weeks, Cert for 60 days   Authorization Type VA, 15 visits    Authorization Time Period 02/02/20-06/01/20    Authorization - Visit Number 10    Authorization - Number of Visits 15    PT Start Time 9449    PT Stop Time 1445    PT Time Calculation (min) 42 min    Equipment Utilized During Treatment Other (comment);Gait belt   L knee brace   Activity Tolerance Patient tolerated treatment well    Behavior During Therapy [facility] for tasks assessed/performed           Past Medical History:  Diagnosis Date  . Anginal pain ([place])   . Bell palsy   . CHF (congestive heart failure) ([place])   . Chronic kidney disease   . Complication of anesthesia    slow to wake up  . Diabetes mellitus without complication ([place])   . Dyspnea   . Environmental and seasonal allergies   . GERD (gastroesophageal reflux disease)   . Glaucoma   . Hypertension   . Hyperthyroidism    pt states that it's hypothyroidism  . Myocardial infarction ([place])   . Sleep apnea     Past Surgical History:  Procedure Laterality Date  . ABDOMINAL HYSTERECTOMY    . APPENDECTOMY    . BICEPT TENODESIS Left 06/10/2017   Procedure: BICEPS TENODESIS;  Surgeon: Leim Fabry, MD;  Location: [facility];  Service: Orthopedics;  Laterality: Left;  . BREAST REDUCTION SURGERY    . CATARACT EXTRACTION, BILATERAL Bilateral   . CHOLECYSTECTOMY    . CORONARY ANGIOPLASTY WITH STENT PLACEMENT    . HERNIA REPAIR    . NASAL HEMORRHAGE CONTROL N/A 09/17/2017    Procedure: EPISTAXIS CONTROL;  Surgeon: [name], MD;  Location: [facility];  Service: ENT;  Laterality: N/A;  . REVERSE SHOULDER ARTHROPLASTY Left 06/10/2017   Procedure: REVERSE SHOULDER ARTHROPLASTY;  Surgeon: Leim Fabry, MD;  Location: [facility];  Service: Orthopedics;  Laterality: Left;  . REVERSE SHOULDER ARTHROPLASTY Right 11/03/2018   Procedure: REVERSE SHOULDER ARTHROPLASTY, BICEPS TENODESIS;  Surgeon: Leim Fabry, MD;  Location: [facility];  Service: Orthopedics;  Laterality: Right;    There were no vitals filed for this visit.   Subjective Assessment - 05/03/20 1406    Subjective Patient reports had a good birthday. No new changes. Patient got a new brace and it fits much better.    Patient is accompained by: Family member    Pertinent History Anginal pain, L Bell palsy, CHF, CKD, DM, dyspnea, seasonal allergies, GERD, glaucoma-had surgery to repair, MI (20+ years ago per pt), hyperthyroidism (per chart but pt states it's hypo), HTN, sleep apnea    How long can you stand comfortably? 20-25 minutes    How long can you walk comfortably? Unsure of time but stated about 48' before L knee pain incr.    Patient Stated Goals To go to grocery store without having to ride electric scooter, improve balance to walk further, and decr. pain  Currently in Pain? No/denies              [facility] Adult PT Treatment/Exercise - 05/03/20 0001      Ambulation/Gait   Ambulation/Gait Yes    Ambulation/Gait Assistance 5: Supervision    Ambulation/Gait Assistance Details Completed ambulation with rollator x 550 ft. Patient requesting to use rollator for long distance ambulation. supervision throughout. Mild pain at end of completion (rated 4/10).    Ambulation Distance (Feet) 550 Feet    Assistive device Rollator    Gait Pattern Step-through pattern;Decreased stride length;Decreased stance time - left;Antalgic    Ambulation Surface Level;Indoor      Standardized Balance Assessment   Standardized  Balance Assessment Berg Balance Test;Dynamic Gait Index      Berg Balance Test   Sit to Stand Able to stand without using hands and stabilize independently    Standing Unsupported Able to stand safely 2 minutes    Sitting with Back Unsupported but Feet Supported on Floor or Stool Able to sit safely and securely 2 minutes    Stand to Sit Sits safely with minimal use of hands    Transfers Able to transfer safely, minor use of hands    Standing Unsupported with Eyes Closed Able to stand 10 seconds safely    Standing Ubsupported with Feet Together Able to place feet together independently and stand 1 minute safely    From Standing, Reach Forward with Outstretched Arm Can reach confidently >25 cm (10")    From Standing Position, Pick up Object from Floor Able to pick up shoe safely and easily    From Standing Position, Turn to Look Behind Over each Shoulder Looks behind from both sides and weight shifts well    Turn 360 Degrees Able to turn 360 degrees safely one side only in 4 seconds or less    Standing Unsupported, Alternately Place Feet on Step/Stool Able to stand independently and safely and complete 8 steps in 20 seconds    Standing Unsupported, One Foot in Front Able to place foot tandem independently and hold 30 seconds    Standing on One Leg Able to lift leg independently and hold 5-10 seconds    Total Score 54      Dynamic Gait Index   Level Surface Mild Impairment    Change in Gait Speed Mild Impairment    Gait with Horizontal Head Turns Moderate Impairment    Gait with Vertical Head Turns Mild Impairment    Gait and Pivot Turn Mild Impairment    Step Over Obstacle Moderate Impairment    Step Around Obstacles Normal    Steps Mild Impairment    Total Score 15      Self-Care   Self-Care Other Self-Care Comments    Other Self-Care Comments  Reviewed ability to complete ambulation in grocery store/IADLs with no LOB or increased pain reported. However, patient has been unable to  return to bowling at this time.                  PT Education - 05/03/20 1540    Education Details Progress toward LTG; PLan to complete aquatics    Person(s) Educated Patient    Methods Explanation    Comprehension Verbalized understanding            PT Short Term Goals - 03/30/20 1409      PT SHORT TERM GOAL #1   Title Pt will be IND with HEP to improve pain, balance, flexibility and strength.  TARGET DATE FOR ALL STGS: 04/07/20    Baseline Patient reports independence with HEP, completed 2x/daily.    Time 4    Period Weeks    Status Achieved      PT SHORT TERM GOAL #2   Title Perform BERG and write goals as indicated.    Time 4    Period Weeks    Status Achieved      PT SHORT TERM GOAL #3   Title Pt will improve gait speed with LRAD to >/=1.76ft/sec. to decr. falls risk.    Time 4    Period Weeks    Status Achieved      PT SHORT TERM GOAL #4   Title Pt will report knee pain decr. to 7/10 at worst, on average to improve functional mobility.    Baseline gets up to 8/10 with alot of walking per patient reports    Time 4    Period Weeks    Status On-going      PT SHORT TERM GOAL #5   Title Pt will amb. 300' at MOD I level with LRAD, with <7/10 knee pan to improve functional mobility.    Baseline 345' with Rollator, 6/10 pain    Time 4    Period Weeks    Status Achieved      PT SHORT TERM GOAL #6   Title Pt will improve BERG balance score to >/=49/56 in order to indicate dec fall risk.    Baseline 46/56    Time 4    Period Weeks    Status On-going             PT Long Term Goals - 05/03/20 1409      PT LONG TERM GOAL #1   Title Pt will report no falls in last four weeks to improve safety. TARGET DATE FOR ALL LTGS: 05/02/20    Baseline no falls in past 4-5 weeks per patient reports    Time 8    Period Weeks    Status Achieved      PT LONG TERM GOAL #2   Title Pt will amb. 500' in/outdoors at MOD I level with LRAD with </=5/10 knee pain to  improve functional mobility.    Baseline Patient ambulate 550' indoors with rollator (4/10 pain)    Time 8    Period Weeks    Status Partially Met      PT LONG TERM GOAL #3   Title Pt will be able to attempt bowling in order to improve QOL.    Baseline has not returned to bowling at this time    Time 8    Period Weeks    Status On-going      PT LONG TERM GOAL #4   Title Pt will report ability to grocery shop with LRAD and no LOB or incr. pain to perform IADLs.    Baseline ability to grocery shop and no LOB/no pain per patient reports    Time 8    Period Weeks    Status Achieved      PT LONG TERM GOAL #5   Title Pt will score >/=53/56 on BERG balance (and perform DGI/FGA as able) in order to indicate dec fall risk.    Baseline 54/56    Time 8    Period Weeks    Status Achieved           Updated Short Term Goals:   PT Short Term Goals - 05/03/20 1551  PT SHORT TERM GOAL #1   Title Patient will initiate aquatic therapy (ALL STGs Due: 05/24/20)    Baseline waiting to begin aquatics.    Time 3    Period Weeks    Status New    Target Date 05/24/20          Updated Long Term Goals:   PT Long Term Goals - 05/03/20 1548      PT LONG TERM GOAL #1   Title Patient will be independent with land/aquatic HEP for improved strengthening/ balance (ALL LTGS Due: 06/14/2020)    Baseline Land HEP provided    Time 6    Period Weeks    Status New    Target Date 06/14/20      PT LONG TERM GOAL #2   Title Pt will amb. 500' on outdoor/unlevel surfaces at MOD I level with LRAD with </=5/10 knee pain to improve functional mobility.    Baseline Patient ambulate 550' indoors with rollator (4/10 pain)    Time 6    Period Weeks    Status Revised      PT LONG TERM GOAL #3   Title Pt will be able to attempt bowling in order to improve QOL.    Baseline has not returned to bowling at this time    Time 6    Period Weeks    Status On-going      PT LONG TERM GOAL #4   Title Patient  will improve DGI to >/= 19 to demonstrate improved balance and reduced fall risk    Baseline 15/24    Time 6    Period Weeks    Status New                Plan - 05/03/20 1541    Clinical Impression Statement Today's skilled PT session focused on progress toward LTG. Patient able to meet LTG #1, 2,4 and 5 demonstrating improved balance and activity tolerance with reduced pain. Paitent improved Berg Balance to 54/56, however with more challenging balance assesment patient scoring 15/24 on DGI still demonstrating increased fall risk. Patient did demonstrate ability to ambulate 550 ft today with Rollator and mild pain (4/10) in knee. Patient has made progress with PT services and is progressing well. Will continue to benefit from skilled PT to continue to address strength, activity otlerance, balance, and participation in aquatic therapy. Will continue to progress toward all unmet LTG.    Personal Factors and Comorbidities Age;Fitness;Comorbidity 3+    Comorbidities Anginal pain, L Bell palsy, CHF, CKD, DM, dyspnea, seasonal allergies, GERD, glaucoma-had surgery to repair, MI (20+ years ago per pt), hyperthyroidism (per chart but pt states it's hypo), HTN, sleep apnea    Examination-Activity Limitations Bed Mobility;Bend;Carry;Continence;Toileting;Stand;Stairs;Squat;Locomotion Level;Transfers    Examination-Participation Restrictions Driving;Meal Prep;Interpersonal Relationship;Laundry    Stability/Clinical Decision Making Evolving/Moderate complexity    Rehab Potential Good    PT Frequency 2x / week   PT Duration 3 weeks   followed by 1x/week for 3 (allow for time to be worked into aquatic therapy)   PT Treatment/Interventions ADLs/Self Care Home Management;Aquatic Therapy;Biofeedback;Canalith Repostioning;DME Instruction;Balance training;Therapeutic exercise;Therapeutic activities;Functional mobility training;Stair training;Gait training;Neuromuscular re-education;Patient/family  education;Orthotic Fit/Training;Manual techniques;Vestibular    PT Next Visit Plan Patient recieved her brace. Plan to begin aquatic therapy week of 1/31 if openings available, if not will start the following wekk.  Continue to work towards patient being able to negotiate stairs instead of ramp.  Warm up on Nust[place], strengthening and balance    PT  Home Exercise Plan [employer]: [id number]    Consulted and Agree with Plan of Care Patient           Patient will benefit from skilled therapeutic intervention in order to improve the following deficits and impairments:  Abnormal gait,Decreased range of motion,Difficulty walking,Dizziness,Decreased knowledge of precautions,Decreased endurance,Decreased balance,Decreased knowledge of use of DME,Decreased mobility,Decreased strength,Increased edema,Postural dysfunction,Impaired flexibility,Pain  Visit Diagnosis: Other abnormalities of gait and mobility  Muscle weakness (generalized)  Unsteadiness on feet     Problem List Patient Active Problem List   Diagnosis Date Noted  . 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 Status post shoulder replacement 06/10/2017    Jones Bales, PT, DPT 05/03/2020, 3:47 PM  [place] [address] [place], [place], [address] Phone: [phone]   Fax:  [phone]  Name: Tamula Morrical MRN: [id number] Date of Birth: 30-Apr-1950

## 2020-05-10 ENCOUNTER — Ambulatory Visit: Payer: No Typology Code available for payment source | Attending: Internal Medicine | Admitting: Occupational Therapy

## 2020-05-10 ENCOUNTER — Ambulatory Visit: Payer: No Typology Code available for payment source

## 2020-05-10 ENCOUNTER — Other Ambulatory Visit: Payer: Self-pay

## 2020-05-10 DIAGNOSIS — M25642 Stiffness of left hand, not elsewhere classified: Secondary | ICD-10-CM | POA: Insufficient documentation

## 2020-05-10 DIAGNOSIS — M25562 Pain in left knee: Secondary | ICD-10-CM | POA: Insufficient documentation

## 2020-05-10 DIAGNOSIS — M6281 Muscle weakness (generalized): Secondary | ICD-10-CM | POA: Diagnosis present

## 2020-05-10 DIAGNOSIS — R2681 Unsteadiness on feet: Secondary | ICD-10-CM | POA: Insufficient documentation

## 2020-05-10 DIAGNOSIS — R2689 Other abnormalities of gait and mobility: Secondary | ICD-10-CM | POA: Diagnosis present

## 2020-05-10 DIAGNOSIS — G8929 Other chronic pain: Secondary | ICD-10-CM | POA: Insufficient documentation

## 2020-05-10 DIAGNOSIS — M25561 Pain in right knee: Secondary | ICD-10-CM | POA: Insufficient documentation

## 2020-05-10 NOTE — Patient Instructions (Signed)
   Aquatic Therapy: What to Expect!  Where:  [facility] [address] [place], [place] [address] [phone]           How to Prepare: . Please make sure you drink 8 ounces of water about one hour prior to your pool session . A caregiver must attend the entire session with the patient (unless your primary therapists feels this is not necessary). The caregiver will be responsible for assisting with dressing as well as any toileting needs.  . Please arrive IN YOUR SUIT and a few minutes prior to your appointment - this helps to avoid delays in starting your session. . Please make sure to attend to any toileting needs prior to entering the pool . Once on the pool deck your therapist will ask you to sign the Patient  Consent and Assignment of Benefits form . Your therapist may take your blood pressure prior to, during and after your session if indicated . We usually try and create a home exercise program based on activities we do in the pool.  Please be thinking about who might be able to assist you in the pool should you want to participate in an aquatic home exercise program at the time of discharge.  Some patients do not want to or do not have the ability to participate in an aquatic home program - this is not a barrier in any way to you participating in aquatic therapy as part of your current therapy plan!    About the pool: 1. Entering the pool Your therapist will assist you; there are multiple ways to enter including stairs with railings, a walk in ramp, a roll in chair and a mechanical lift. Your therapist will determine the most appropriate way for you. 2. Water temperature is usually between 86-87 degrees 3. There may be other swimmers in the pool at the same time     Contact Info:             Appointments: [facility]         All sessions are 45 minutes   [address]            Please call the [facility] if   [place], [place]  [address]           you need to cancel or reschedule an appointment.  [place]           Guido Sander, PT    Forde Radon, OTR/L    09/30/19

## 2020-05-10 NOTE — Therapy (Signed)
[facility] [address] [place], [place], [address] Phone: [phone]   Fax:  [phone]  Physical Therapy Treatment  Patient Details  Name: Katrina Hughes MRN: [id number] Date of Birth: 07/04/1950 Referring Provider (PT): Gomez Cleverly, MD   Encounter Date: 05/10/2020   PT End of Session - 05/10/20 1416    Visit Number 11    Number of Visits 19    Date for PT Re-Evaluation 07/02/20   POC for 6 weeks, Cert for 60 days   Authorization Type VA, 15 visits    Authorization Time Period 02/02/20-06/01/20    Authorization - Visit Number 10    Authorization - Number of Visits 15    PT Start Time 5277    PT Stop Time 1445    PT Time Calculation (min) 42 min    Equipment Utilized During Treatment Other (comment);Gait belt   L knee brace   Activity Tolerance Patient tolerated treatment well    Behavior During Therapy [facility] for tasks assessed/performed           Past Medical History:  Diagnosis Date  . Anginal pain ([place])   . Bell palsy   . CHF (congestive heart failure) ([place])   . Chronic kidney disease   . Complication of anesthesia    slow to wake up  . Diabetes mellitus without complication ([place])   . Dyspnea   . Environmental and seasonal allergies   . GERD (gastroesophageal reflux disease)   . Glaucoma   . Hypertension   . Hyperthyroidism    pt states that it's hypothyroidism  . Myocardial infarction ([place])   . Sleep apnea     Past Surgical History:  Procedure Laterality Date  . ABDOMINAL HYSTERECTOMY    . APPENDECTOMY    . BICEPT TENODESIS Left 06/10/2017   Procedure: BICEPS TENODESIS;  Surgeon: Leim Fabry, MD;  Location: [facility];  Service: Orthopedics;  Laterality: Left;  . BREAST REDUCTION SURGERY    . CATARACT EXTRACTION, BILATERAL Bilateral   . CHOLECYSTECTOMY    . CORONARY ANGIOPLASTY WITH STENT PLACEMENT    . HERNIA REPAIR    . NASAL HEMORRHAGE CONTROL N/A 09/17/2017   Procedure: EPISTAXIS  CONTROL;  Surgeon: [name], MD;  Location: [facility];  Service: ENT;  Laterality: N/A;  . REVERSE SHOULDER ARTHROPLASTY Left 06/10/2017   Procedure: REVERSE SHOULDER ARTHROPLASTY;  Surgeon: Leim Fabry, MD;  Location: [facility];  Service: Orthopedics;  Laterality: Left;  . REVERSE SHOULDER ARTHROPLASTY Right 11/03/2018   Procedure: REVERSE SHOULDER ARTHROPLASTY, BICEPS TENODESIS;  Surgeon: Leim Fabry, MD;  Location: [facility];  Service: Orthopedics;  Laterality: Right;    There were no vitals filed for this visit.   Subjective Assessment - 05/10/20 1405    Subjective Patient reports no new issues/complaints. No pain to report. Patient continues to wear brace.    Patient is accompained by: Family member    Pertinent History Anginal pain, L Bell palsy, CHF, CKD, DM, dyspnea, seasonal allergies, GERD, glaucoma-had surgery to repair, MI (20+ years ago per pt), hyperthyroidism (per chart but pt states it's hypo), HTN, sleep apnea    How long can you stand comfortably? 20-25 minutes    How long can you walk comfortably? Unsure of time but stated about 93' before L knee pain incr.    Patient Stated Goals To go to grocery store without having to ride electric scooter, improve balance to walk further, and decr. pain    Currently in Pain? No/denies  [facility] Adult PT Treatment/Exercise - 05/10/20 1422      Ambulation/Gait   Ambulation/Gait Yes    Ambulation/Gait Assistance 5: Supervision    Ambulation/Gait Assistance Details Completed ambulation x 230 ft without AD, working toward improved step length. Patient demo improved ambulation tolerance with completion. Rest break required after completion.    Ambulation Distance (Feet) 230 Feet    Assistive device None    Gait Pattern Step-through pattern;Decreased stride length;Decreased stance time - left;Antalgic    Ambulation Surface Level;Indoor      Self-Care   Self-Care Other Self-Care Comments    Other Self-Care Comments   Patient wanting to complete aquatic therapy, PT working with patient and wife to get appointment scheduled for aquatic therapy on 2/7 around schedule. PT reviewing Aquatic Information Sheet with patient and addressing any concerns.      Neuro Re-ed    Neuro Re-ed Details  Completed standing on rockerboard positioned ant/posterior completed bean bag toss x 2 sets. Intermittent CGA and intermittent UE support required for balance challenge. Rest break required after completion.      Exercises   Exercises Knee/Hip      Knee/Hip Exercises: Aerobic   Nustep Completed NuStep x Level 4 with BLE x 6 minutes for lower extremity strengthening, endurance, and endurance prior to exercises.      Knee/Hip Exercises: Standing   Hip Abduction Stengthening;Both;1 set;10 reps;Knee straight;Limitations    Abduction Limitations UE support required. without resistance verbal cues for form.    Hip Extension Stengthening;Both;1 set;10 reps;Knee straight;Limitations    Extension Limitations UE support required. without resistance, verbal cues for form (ensure keeping knee extended)    Forward Step Up Both;1 set;15 reps;Hand Hold: 2;Step Height: 6";Limitations    Forward Step Up Limitations completed forward steps up to 6" step.    Other Standing Knee Exercises Completed in // bars with BUE support, completed mini squats x 10 reps. Verbal cues for form.                  PT Education - 05/10/20 1451    Education Details Educated on Aquatic Appt and Aquatic Information Sheet    Person(s) Educated Patient;Spouse    Methods Explanation;Handout    Comprehension Verbalized understanding            PT Short Term Goals - 05/03/20 1551      PT SHORT TERM GOAL #1   Title Patient will initiate aquatic therapy (ALL STGs Due: 05/24/20)    Baseline waiting to begin aquatics.    Time 3    Period Weeks    Status New    Target Date 05/24/20             PT Long Term Goals - 05/03/20 1548      PT LONG  TERM GOAL #1   Title Patient will be independent with land/aquatic HEP for improved strengthening/ balance (ALL LTGS Due: 06/14/2020)    Baseline Land HEP provided    Time 6    Period Weeks    Status New    Target Date 06/14/20      PT LONG TERM GOAL #2   Title Pt will amb. 500' on outdoor/unlevel surfaces at MOD I level with LRAD with </=5/10 knee pain to improve functional mobility.    Baseline Patient ambulate 550' indoors with rollator (4/10 pain)    Time 6    Period Weeks    Status Revised      PT LONG TERM GOAL #3  Title Pt will be able to attempt bowling in order to improve QOL.    Baseline has not returned to bowling at this time    Time 6    Period Weeks    Status On-going      PT LONG TERM GOAL #4   Title Patient will improve DGI to >/= 19 to demonstrate improved balance and reduced fall risk    Baseline 15/24    Time 6    Period Weeks    Status New                 Plan - 05/10/20 1451    Clinical Impression Statement Patient agreeing to participate in aquatic and got appointment scheduled. Provided aquatic information sheet to patient. Rest of session working toward further strengthening and balance training, with intermittent CGA required. Continue to require rest breaks intermittently due to fatigue. Will continue to progress toward all LTGs.    Personal Factors and Comorbidities Age;Fitness;Comorbidity 3+    Comorbidities Anginal pain, L Bell palsy, CHF, CKD, DM, dyspnea, seasonal allergies, GERD, glaucoma-had surgery to repair, MI (20+ years ago per pt), hyperthyroidism (per chart but pt states it's hypo), HTN, sleep apnea    Examination-Activity Limitations Bed Mobility;Bend;Carry;Continence;Toileting;Stand;Stairs;Squat;Locomotion Level;Transfers    Examination-Participation Restrictions Driving;Meal Prep;Interpersonal Relationship;Laundry    Stability/Clinical Decision Making Evolving/Moderate complexity    Rehab Potential Good    PT Frequency 2x / week     PT Duration 3 weeks   followed by 1x/week for 3 (allow for time to be worked into aquatic therapy)   PT Treatment/Interventions ADLs/Self Care Home Management;Aquatic Therapy;Biofeedback;Canalith Repostioning;DME Instruction;Balance training;Therapeutic exercise;Therapeutic activities;Functional mobility training;Stair training;Gait training;Neuromuscular re-education;Patient/family education;Orthotic Fit/Training;Manual techniques;Vestibular    PT Next Visit Plan Patient has aquatic appt for 2/7.  Continue to work towards patient improved negotiation of stairs.  Warm up on Nustep. Focus on strengthening and balance    PT Home Exercise Plan [employer]: [id number]    Consulted and Agree with Plan of Care Patient           Patient will benefit from skilled therapeutic intervention in order to improve the following deficits and impairments:  Abnormal gait,Decreased range of motion,Difficulty walking,Dizziness,Decreased knowledge of precautions,Decreased endurance,Decreased balance,Decreased knowledge of use of DME,Decreased mobility,Decreased strength,Increased edema,Postural dysfunction,Impaired flexibility,Pain  Visit Diagnosis: Muscle weakness (generalized)  Other abnormalities of gait and mobility  Unsteadiness on feet     Problem List Patient Active Problem List   Diagnosis Date Noted  . 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 Status post shoulder replacement 06/10/2017    Jones Bales, PT, DPT 05/10/2020, 2:54 PM  [facility] [address] [place], [place], [address] Phone: [phone]   Fax:  [phone]  Name: Katrina Hughes MRN: [id number] Date of Birth: [birth year]

## 2020-05-10 NOTE — Therapy (Addendum)
[facility] [address] [place], [place], [address] Phone: [phone]   Fax:  [phone]  Occupational Therapy Treatment  Patient Details  Name: Katrina Hughes MRN: [id number] Date of Birth: 1951-03-21 No data recorded  Encounter Date: 05/10/2020   OT End of Session - 05/10/20 1424    Visit Number 10    Number of Visits 15    Date for OT Re-Evaluation 04/28/20    Authorization Type VA - Approved 15 visits (already used 1 at the [place] [facility] for O.T. )    Authorization Time Period 02/02/20 - 06/01/20    Authorization - Visit Number 11    Authorization - Number of Visits 15    OT Start Time 1315    OT Stop Time 1400    OT Time Calculation (min) 45 min    Activity Tolerance Patient tolerated treatment well    Behavior During Therapy [facility] for tasks assessed/performed           Past Medical History:  Diagnosis Date  . Anginal pain ([place])   . Bell palsy   . CHF (congestive heart failure) ([place])   . Chronic kidney disease   . Complication of anesthesia    slow to wake up  . Diabetes mellitus without complication ([place])   . Dyspnea   . Environmental and seasonal allergies   . GERD (gastroesophageal reflux disease)   . Glaucoma   . Hypertension   . Hyperthyroidism    pt states that it's hypothyroidism  . Myocardial infarction ([place])   . Sleep apnea     Past Surgical History:  Procedure Laterality Date  . ABDOMINAL HYSTERECTOMY    . APPENDECTOMY    . BICEPT TENODESIS Left 06/10/2017   Procedure: BICEPS TENODESIS;  Surgeon: Leim Fabry, MD;  Location: [facility];  Service: Orthopedics;  Laterality: Left;  . BREAST REDUCTION SURGERY    . CATARACT EXTRACTION, BILATERAL Bilateral   . CHOLECYSTECTOMY    . CORONARY ANGIOPLASTY WITH STENT PLACEMENT    . HERNIA REPAIR    . NASAL HEMORRHAGE CONTROL N/A 09/17/2017   Procedure: EPISTAXIS CONTROL;  Surgeon: [name], MD;  Location: [facility];  Service: ENT;  Laterality: N/A;  .  REVERSE SHOULDER ARTHROPLASTY Left 06/10/2017   Procedure: REVERSE SHOULDER ARTHROPLASTY;  Surgeon: Leim Fabry, MD;  Location: [facility];  Service: Orthopedics;  Laterality: Left;  . REVERSE SHOULDER ARTHROPLASTY Right 11/03/2018   Procedure: REVERSE SHOULDER ARTHROPLASTY, BICEPS TENODESIS;  Surgeon: Leim Fabry, MD;  Location: [facility];  Service: Orthopedics;  Laterality: Right;    There were no vitals filed for this visit.   Subjective Assessment - 05/10/20 1330    Subjective  Dr. Amedeo Plenty wants you to call him. (therapist called and left voicemail message)    Pertinent History displaced fx of proximal phalanx Lt small finger after fall on 12/10/19    Limitations none    Currently in Pain? No/denies    Pain Onset More than a month ago                        OT Treatments/Exercises (OP) - 05/10/20 0001      Exercises   Exercises Hand      Hand Exercises   Other Hand Exercises see additional HEP issued today - pt performed each as indicated. Cues to prevent wrist UD w/ flipping cards      Modalities   Modalities Fluidotherapy      LUE Fluidotherapy  Number Minutes Fluidotherapy 10 Minutes    LUE Fluidotherapy Location Hand    Comments for stiffness      Manual Therapy   Manual Therapy Passive ROM    Passive ROM Worked on composite flexion of ring and small finger as pt tends to extend MP joint when flexing PIP joint - pt tight w/ composite flexion but also ulnar n. not fully functioning           Had called Dr. Amedeo Plenty at beginning of session per MD request and left message as he was unavailable. Called again at 2:30 and spoke directly with Dr. Amedeo Plenty and he felt we have gone as far as we can get in therapy and it's ok to continue with ex's at home - therefore will plan on d/c following session       OT Education - 05/10/20 1423    Education Details Added resistive intrinsic + ex with red putty, functional ex to promote intrinsic + movement (flipping cards)     Person(s) Educated Patient    Methods Explanation;Demonstration;Handout;Verbal cues    Comprehension Verbalized understanding;Returned demonstration;Verbal cues required            OT Short Term Goals - 04/13/20 1409      OT SHORT TERM GOAL #1   Title Independent with initial HEP    Time 3    Period Weeks    Status Achieved      OT SHORT TERM GOAL #2   Title Pt to demo 75% composite finger flexion in prep for better grasping    Baseline 40%    Time 3    Period Weeks    Status Achieved   80-85%     OT SHORT TERM GOAL #3   Title Pt to report decreased numbness and pain along ulnar n. distribution and at medial elbow w/ wrist flexion    Time 3    Period Weeks    Status Achieved      OT SHORT TERM GOAL #4   Title Pt to improve grip strength Lt hand to 15 lbs    Baseline 9 lbs    Time 3    Period Weeks    Status Achieved   04/13/20: 22.7 lbs, 25.3 lbs            OT Long Term Goals - 05/03/20 1356      OT LONG TERM GOAL #1   Title Independent with updated HEP    Time 7    Period Weeks    Status Achieved      OT LONG TERM GOAL #2   Title Pt to demo 90% or greater full composite flexion Lt hand    Time 7    Period Weeks    Status On-going      OT LONG TERM GOAL #3   Title Pt to demo 20 lbs or greater grip strength Lt hand    Baseline 9 lbs    Time 7    Period Weeks    Status On-going      OT LONG TERM GOAL #4   Title Pt to consistently use Lt hand as assist for bilateral tasks    Time 7    Period Weeks    Status Achieved                 Plan - 05/10/20 1427    Clinical Impression Statement Pt continues to make steady progress with no pain last 2 sessions. Pt has  seen hand specialist and per pt report wants pt to continue therapy for hand. However got clarification w/ MD after session that he feels she has reached max potential and ok to d/c and continue ex's at home.    OT Occupational Profile and History Detailed Assessment- Review of Records  and additional review of physical, cognitive, psychosocial history related to current functional performance    Occupational performance deficits (Please refer to evaluation for details): IADL's    Body Structure / Function / Physical Skills Strength;Pain;Dexterity;Edema;UE functional use;IADL;ROM;Flexibility;Sensation;Coordination    Rehab Potential Good    Clinical Decision Making Several treatment options, min-mod task modification necessary    Comorbidities Affecting Occupational Performance: May have comorbidities impacting occupational performance    Modification or Assistance to Complete Evaluation  No modification of tasks or assist necessary to complete eval    OT Frequency 2x / week    OT Duration --   for 7 weeks   OT Treatment/Interventions Self-care/ADL training;Moist Heat;Fluidtherapy;DME and/or AE instruction;Splinting;Therapeutic activities;Ultrasound;Therapeutic exercise;Cryotherapy;Neuromuscular education;Functional Mobility Training;Passive range of motion;Electrical Stimulation;Paraffin;Manual Therapy;Patient/family education    Plan reassess grip strength, try paraffin next session, d/c next session per MD recommendation and therapist agrees    Consulted and Agree with Plan of Care Patient    Family Member Consulted wife           Patient will benefit from skilled therapeutic intervention in order to improve the following deficits and impairments:   Body Structure / Function / Physical Skills: Strength,Pain,Dexterity,Edema,UE functional use,IADL,ROM,Flexibility,Sensation,Coordination       Visit Diagnosis: Stiffness of left hand, not elsewhere classified  Muscle weakness (generalized)    Problem List Patient Active Problem List   Diagnosis Date Noted  . 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 Status post shoulder replacement 06/10/2017    Carey Bullocks, OTR/L 05/10/2020, 2:43 PM  [facility] [address] [place], [place], [address] Phone: [phone]   Fax:  [phone]  Name: Faith Patricelli MRN: [id number] Date of Birth: Oct 14, 1950

## 2020-05-10 NOTE — Patient Instructions (Signed)
MP Flexion (Resistive Putty)    Bending only at large knuckles, press putty down against thumb. Keep fingertips straight and pull apart like taffy. (Keep ring and pinky finger on putty when pulling apart)  Repeat __10__ times. Do _2___ sessions per day.   Flip cards over keeping the same motion as above with big knuckles bent, fingers straight. Keep small finger together with ring finger. Go through entire deck x 1 - do 2 times per day

## 2020-05-16 ENCOUNTER — Ambulatory Visit: Payer: No Typology Code available for payment source | Admitting: Physical Therapy

## 2020-05-16 ENCOUNTER — Other Ambulatory Visit: Payer: Self-pay

## 2020-05-16 DIAGNOSIS — M25562 Pain in left knee: Secondary | ICD-10-CM

## 2020-05-16 DIAGNOSIS — R2689 Other abnormalities of gait and mobility: Secondary | ICD-10-CM

## 2020-05-16 DIAGNOSIS — G8929 Other chronic pain: Secondary | ICD-10-CM

## 2020-05-16 DIAGNOSIS — M25642 Stiffness of left hand, not elsewhere classified: Secondary | ICD-10-CM | POA: Diagnosis not present

## 2020-05-16 DIAGNOSIS — M6281 Muscle weakness (generalized): Secondary | ICD-10-CM

## 2020-05-17 ENCOUNTER — Encounter: Payer: Self-pay | Admitting: Physical Therapy

## 2020-05-17 NOTE — Therapy (Signed)
[facility] [address] [place], [place], [address] Phone: [phone]   Fax:  [phone]  Physical Therapy Treatment  Patient Details  Name: Katrina Hughes MRN: [id number] Date of Birth: 09-27-1950 Referring Provider (PT): Gomez Cleverly, MD   Encounter Date: 05/16/2020   PT End of Session - 05/17/20 6962    Visit Number 12    Number of Visits 19    Date for PT Re-Evaluation 07/02/20   POC for 6 weeks, Cert for 60 days   Authorization Type VA, 15 visits    Authorization Time Period 02/02/20-06/01/20    Authorization - Visit Number 11    Authorization - Number of Visits 15    PT Start Time 9528    PT Stop Time 1630    PT Time Calculation (min) 45 min    Equipment Utilized During Treatment Other (comment)   POOL NOODLE, ANKLE BUOYANCY CUFFS   Activity Tolerance Patient tolerated treatment well    Behavior During Therapy [facility] for tasks assessed/performed           Past Medical History:  Diagnosis Date  . Anginal pain ([place])   . Bell palsy   . CHF (congestive heart failure) ([place])   . Chronic kidney disease   . Complication of anesthesia    slow to wake up  . Diabetes mellitus without complication ([place])   . Dyspnea   . Environmental and seasonal allergies   . GERD (gastroesophageal reflux disease)   . Glaucoma   . Hypertension   . Hyperthyroidism    pt states that it's hypothyroidism  . Myocardial infarction ([place])   . Sleep apnea     Past Surgical History:  Procedure Laterality Date  . ABDOMINAL HYSTERECTOMY    . APPENDECTOMY    . BICEPT TENODESIS Left 06/10/2017   Procedure: BICEPS TENODESIS;  Surgeon: Leim Fabry, MD;  Location: [facility];  Service: Orthopedics;  Laterality: Left;  . BREAST REDUCTION SURGERY    . CATARACT EXTRACTION, BILATERAL Bilateral   . CHOLECYSTECTOMY    . CORONARY ANGIOPLASTY WITH STENT PLACEMENT    . HERNIA REPAIR    . NASAL HEMORRHAGE CONTROL N/A 09/17/2017   Procedure:  EPISTAXIS CONTROL;  Surgeon: [name], MD;  Location: [facility];  Service: ENT;  Laterality: N/A;  . REVERSE SHOULDER ARTHROPLASTY Left 06/10/2017   Procedure: REVERSE SHOULDER ARTHROPLASTY;  Surgeon: Leim Fabry, MD;  Location: [facility];  Service: Orthopedics;  Laterality: Left;  . REVERSE SHOULDER ARTHROPLASTY Right 11/03/2018   Procedure: REVERSE SHOULDER ARTHROPLASTY, BICEPS TENODESIS;  Surgeon: Leim Fabry, MD;  Location: [facility];  Service: Orthopedics;  Laterality: Right;    There were no vitals filed for this visit.   Subjective Assessment - 05/17/20 1453    Subjective Pt reports being very nervous about first aquatic session and reports she is unable to swim and has not been in pool in >10 years.    Patient is accompained by: Family member    Pertinent History Anginal pain, L Bell palsy, CHF, CKD, DM, dyspnea, seasonal allergies, GERD, glaucoma-had surgery to repair, MI (20+ years ago per pt), hyperthyroidism (per chart but pt states it's hypo), HTN, sleep apnea    How long can you stand comfortably? 20-25 minutes    How long can you walk comfortably? Unsure of time but stated about 47' before L knee pain incr.    Patient Stated Goals To go to grocery store without having to ride electric scooter, improve balance to walk  further, and decr. pain    Currently in Pain? No/denies   describes discomfort in L knee but improved with session          Aquatic therapy at [facility]   Patient seen for aquatic therapy today. Treatment took place in water 3.6-4.7feet deep depending upon activity. Pt entered and exited the pool viastepnegotiation with use of bil. Hand rails for support.; 6 steps - approx. 4" height  Pt performed bil. Hamstrings/heel cord stretch (runner's stretch) - 1 rep each leg for 30 sec hold  Pt gait trained in3.8' - 4.2'water holding onto pooledge.  Progressed to performing gait trainingholding onto noodle with bil. UE's (noodle  stabilized by PTA)   Pt performed forward, backward and side stepping gait with edge then noodle.  Pt sat on bench in pool - LAQ's 10 reps sets each leg ; seated hip flexion 10 reps sets each leg; sit to stand from bench with 1 UE support with CGA x 10 reps  Pt performed standing hip flexion, abduction, and extension 10 reps each leg with ankle buoyancy cuffs and viscosity of water for resistance. Pt using 1-2 UE support at pool edge.  Pt requires buoyancy of water for support and for joint off loading for reduced pain with weight bearing exercise; pt requires viscosity of water for resistance for strengthening; Gait training with less support is able to be performed in the water with increased safety with reduced fall risk than is able to be Performed on land     PT Short Term Goals - 05/03/20 1551      PT SHORT TERM GOAL #1   Title Patient will initiate aquatic therapy (ALL STGs Due: 05/24/20)    Baseline waiting to begin aquatics.    Time 3    Period Weeks    Status New    Target Date 05/24/20             PT Long Term Goals - 05/03/20 1548      PT LONG TERM GOAL #1   Title Patient will be independent with land/aquatic HEP for improved strengthening/ balance (ALL LTGS Due: 06/14/2020)    Baseline Land HEP provided    Time 6    Period Weeks    Status New    Target Date 06/14/20      PT LONG TERM GOAL #2   Title Pt will amb. 500' on outdoor/unlevel surfaces at MOD I level with LRAD with </=5/10 knee pain to improve functional mobility.    Baseline Patient ambulate 550' indoors with rollator (4/10 pain)    Time 6    Period Weeks    Status Revised      PT LONG TERM GOAL #3   Title Pt will be able to attempt bowling in order to improve QOL.    Baseline has not returned to bowling at this time    Time 6    Period Weeks    Status On-going      PT LONG TERM GOAL #4   Title Patient will improve DGI to >/= 19 to demonstrate improved balance and reduced fall risk     Baseline 15/24    Time 6    Period Weeks    Status New                 Plan - 05/17/20 1458    Clinical Impression Statement Pt tolerated first aquatic session well with increased confidence at end of session.  Skillled session  focused on ROM/gentle strengthening to address knee pain/discomfort along with gait/balance.  Pt's goal is to be at level where she can perform aquatic program on her own upon d/c from PT.  Primary PT to add additional visits to address aquatic sessions.    Personal Factors and Comorbidities Age;Fitness;Comorbidity 3+    Comorbidities Anginal pain, L Bell palsy, CHF, CKD, DM, dyspnea, seasonal allergies, GERD, glaucoma-had surgery to repair, MI (20+ years ago per pt), hyperthyroidism (per chart but pt states it's hypo), HTN, sleep apnea    Examination-Activity Limitations Bed Mobility;Bend;Carry;Continence;Toileting;Stand;Stairs;Squat;Locomotion Level;Transfers    Examination-Participation Restrictions Driving;Meal Prep;Interpersonal Relationship;Laundry    Stability/Clinical Decision Making Evolving/Moderate complexity    Rehab Potential Good    PT Frequency 2x / week    PT Duration 3 weeks   followed by 1x/week for 3 (allow for time to be worked into aquatic therapy)   PT Treatment/Interventions ADLs/Self Care Home Management;Aquatic Therapy;Biofeedback;Canalith Repostioning;DME Instruction;Balance training;Therapeutic exercise;Therapeutic activities;Functional mobility training;Stair training;Gait training;Neuromuscular re-education;Patient/family education;Orthotic Fit/Training;Manual techniques;Vestibular    PT Next Visit Plan Primary PT to discuss adding additional aquatic sessions with pt.  Continue to work towards patient improved negotiation of stairs.  Warm up on Nust[place]. Focus on strengthening and balance    PT Home Exercise Plan [employer]: [id number]    Consulted and Agree with Plan of Care Patient           Patient will benefit from skilled  therapeutic intervention in order to improve the following deficits and impairments:  Abnormal gait,Decreased range of motion,Difficulty walking,Dizziness,Decreased knowledge of precautions,Decreased endurance,Decreased balance,Decreased knowledge of use of DME,Decreased mobility,Decreased strength,Increased edema,Postural dysfunction,Impaired flexibility,Pain  Visit Diagnosis: Chronic pain of both knees  Other abnormalities of gait and mobility  Muscle weakness (generalized)     Problem List Patient Active Problem List   Diagnosis Date Noted  . 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 Status post shoulder replacement 06/10/2017    Narda Bonds, [place] 05/17/20 3:04 PM Phone: [phone] Fax: [facility] [address] [place], [place], [address] Phone: [phone]   Fax:  [phone]  Name: Katrina Hughes MRN: [id number] Date of Birth: [birth year]

## 2020-05-18 ENCOUNTER — Ambulatory Visit: Payer: No Typology Code available for payment source | Admitting: Occupational Therapy

## 2020-05-18 ENCOUNTER — Other Ambulatory Visit: Payer: Self-pay

## 2020-05-18 ENCOUNTER — Ambulatory Visit: Payer: Non-veteran care | Admitting: Rehabilitation

## 2020-05-18 DIAGNOSIS — M6281 Muscle weakness (generalized): Secondary | ICD-10-CM

## 2020-05-18 DIAGNOSIS — M25642 Stiffness of left hand, not elsewhere classified: Secondary | ICD-10-CM

## 2020-05-18 NOTE — Therapy (Signed)
[facility] [address] [place], [place], [address] Phone: [phone]   Fax:  [phone]  Occupational Therapy Treatment  Patient Details  Name: Katrina Hughes MRN: [id number] Date of Birth: 12/21/1950 No data recorded  Encounter Date: 05/18/2020   OT End of Session - 05/18/20 1304    Visit Number 11    Number of Visits 15    Date for OT Re-Evaluation 04/28/20    Authorization Type VA - Approved 15 visits (already used 1 at the [place] [facility] for O.T. )    Authorization Time Period 02/02/20 - 06/01/20    Authorization - Visit Number 12    Authorization - Number of Visits 15    OT Start Time 1230    OT Stop Time 1310    OT Time Calculation (min) 40 min    Activity Tolerance Patient tolerated treatment well    Behavior During Therapy [facility] for tasks assessed/performed           Past Medical History:  Diagnosis Date  . Anginal pain ([place])   . Bell palsy   . CHF (congestive heart failure) ([place])   . Chronic kidney disease   . Complication of anesthesia    slow to wake up  . Diabetes mellitus without complication ([place])   . Dyspnea   . Environmental and seasonal allergies   . GERD (gastroesophageal reflux disease)   . Glaucoma   . Hypertension   . Hyperthyroidism    pt states that it's hypothyroidism  . Myocardial infarction ([place])   . Sleep apnea     Past Surgical History:  Procedure Laterality Date  . ABDOMINAL HYSTERECTOMY    . APPENDECTOMY    . BICEPT TENODESIS Left 06/10/2017   Procedure: BICEPS TENODESIS;  Surgeon: Leim Fabry, MD;  Location: [facility];  Service: Orthopedics;  Laterality: Left;  . BREAST REDUCTION SURGERY    . CATARACT EXTRACTION, BILATERAL Bilateral   . CHOLECYSTECTOMY    . CORONARY ANGIOPLASTY WITH STENT PLACEMENT    . HERNIA REPAIR    . NASAL HEMORRHAGE CONTROL N/A 09/17/2017   Procedure: EPISTAXIS CONTROL;  Surgeon: [name], MD;  Location: [facility];  Service: ENT;  Laterality: N/A;  .  REVERSE SHOULDER ARTHROPLASTY Left 06/10/2017   Procedure: REVERSE SHOULDER ARTHROPLASTY;  Surgeon: Leim Fabry, MD;  Location: [facility];  Service: Orthopedics;  Laterality: Left;  . REVERSE SHOULDER ARTHROPLASTY Right 11/03/2018   Procedure: REVERSE SHOULDER ARTHROPLASTY, BICEPS TENODESIS;  Surgeon: Leim Fabry, MD;  Location: [facility];  Service: Orthopedics;  Laterality: Right;    There were no vitals filed for this visit.   Subjective Assessment - 05/18/20 1243    Subjective  I hope to do pool for my knee    Pertinent History displaced fx of proximal phalanx Lt small finger after fall on 12/10/19    Limitations none    Currently in Pain? No/denies    Pain Onset More than a month ago           Assessed remaining goals and progress to date. Pt has met all goals - see below.  Paraffin x 10 min Lt hand to decrease stiffness and in prep for potential use at home.  Reviewed ex's issued last session for intrinsic + movement. Gripper set at level 2 resistance to pick up blocks for sustained grip strength w/ only 1 drop and no rest breaks.  OT Short Term Goals - 04/13/20 1409      OT SHORT TERM GOAL #1   Title Independent with initial HEP    Time 3    Period Weeks    Status Achieved      OT SHORT TERM GOAL #2   Title Pt to demo 75% composite finger flexion in prep for better grasping    Baseline 40%    Time 3    Period Weeks    Status Achieved   80-85%     OT SHORT TERM GOAL #3   Title Pt to report decreased numbness and pain along ulnar n. distribution and at medial elbow w/ wrist flexion    Time 3    Period Weeks    Status Achieved      OT SHORT TERM GOAL #4   Title Pt to improve grip strength Lt hand to 15 lbs    Baseline 9 lbs    Time 3    Period Weeks    Status Achieved   04/13/20: 22.7 lbs, 25.3 lbs            OT Long Term Goals - 05/18/20 1247      OT LONG TERM GOAL #1   Title Independent with updated HEP    Time 7     Period Weeks    Status Achieved      OT LONG TERM GOAL #2   Title Pt to demo 90% or greater full composite flexion Lt hand    Time 7    Period Weeks    Status Achieved      OT LONG TERM GOAL #3   Title Pt to demo 20 lbs or greater grip strength Lt hand    Baseline 9 lbs    Time 7    Period Weeks    Status Achieved   32.1 lbs     OT LONG TERM GOAL #4   Title Pt to consistently use Lt hand as assist for bilateral tasks    Time 7    Period Weeks    Status Achieved                 Plan - 05/18/20 1247    Clinical Impression Statement Pt has met all STG's and LTG's. Pt w/ no pain in LUE last 3 sessions.    OT Occupational Profile and History Detailed Assessment- Review of Records and additional review of physical, cognitive, psychosocial history related to current functional performance    Occupational performance deficits (Please refer to evaluation for details): IADL's    Body Structure / Function / Physical Skills Strength;Pain;Dexterity;Edema;UE functional use;IADL;ROM;Flexibility;Sensation;Coordination    Rehab Potential Good    Clinical Decision Making Several treatment options, min-mod task modification necessary    Comorbidities Affecting Occupational Performance: May have comorbidities impacting occupational performance    Modification or Assistance to Complete Evaluation  No modification of tasks or assist necessary to complete eval    OT Frequency 2x / week    OT Duration --   for 7 weeks   OT Treatment/Interventions Self-care/ADL training;Moist Heat;Fluidtherapy;DME and/or AE instruction;Splinting;Therapeutic activities;Ultrasound;Therapeutic exercise;Cryotherapy;Neuromuscular education;Functional Mobility Training;Passive range of motion;Electrical Stimulation;Paraffin;Manual Therapy;Patient/family education    Plan D/C O.Donnajean Lopes and Agree with Plan of Care Patient    Family Member Consulted wife           Patient will benefit from skilled  therapeutic intervention in order to improve the following deficits and impairments:   Body  Structure / Function / Physical Skills: Strength,Pain,Dexterity,Edema,UE functional use,IADL,ROM,Flexibility,Sensation,Coordination       Visit Diagnosis: Stiffness of left hand, not elsewhere classified  Muscle weakness (generalized)    Problem List Patient Active Problem List   Diagnosis Date Noted  . 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 Status post shoulder replacement 06/10/2017    OCCUPATIONAL THERAPY DISCHARGE SUMMARY  Visits from Start of Care: 11  Current functional level related to goals / functional outcomes: See above    Remaining deficits: Full composite flexion of small finger slightly limited and PIP extension    Education / Equipment: HEP's, pain management  Plan: Patient agrees to discharge.  Patient goals were met. Patient is being discharged due to meeting the stated rehab goals.  ?????        Carey Bullocks, OTR/L 05/18/2020, 1:06 PM  [facility] [address] [place], [place], [address] Phone: [phone]   Fax:  [phone]  Name: Katrina Hughes MRN: [id number] Date of Birth: 12/22/1950

## 2020-05-23 ENCOUNTER — Ambulatory Visit: Payer: No Typology Code available for payment source

## 2020-05-23 ENCOUNTER — Ambulatory Visit: Payer: No Typology Code available for payment source | Admitting: Occupational Therapy

## 2020-05-23 IMAGING — DX PORTABLE RIGHT SHOULDER
1 series · 1 of 1 positions shown · non-contrast
Comparison: CT right shoulder 08/25/2018

CLINICAL DATA: Patient status post shoulder replacement today.

EXAM:
PORTABLE RIGHT SHOULDER

[shoulder obl]
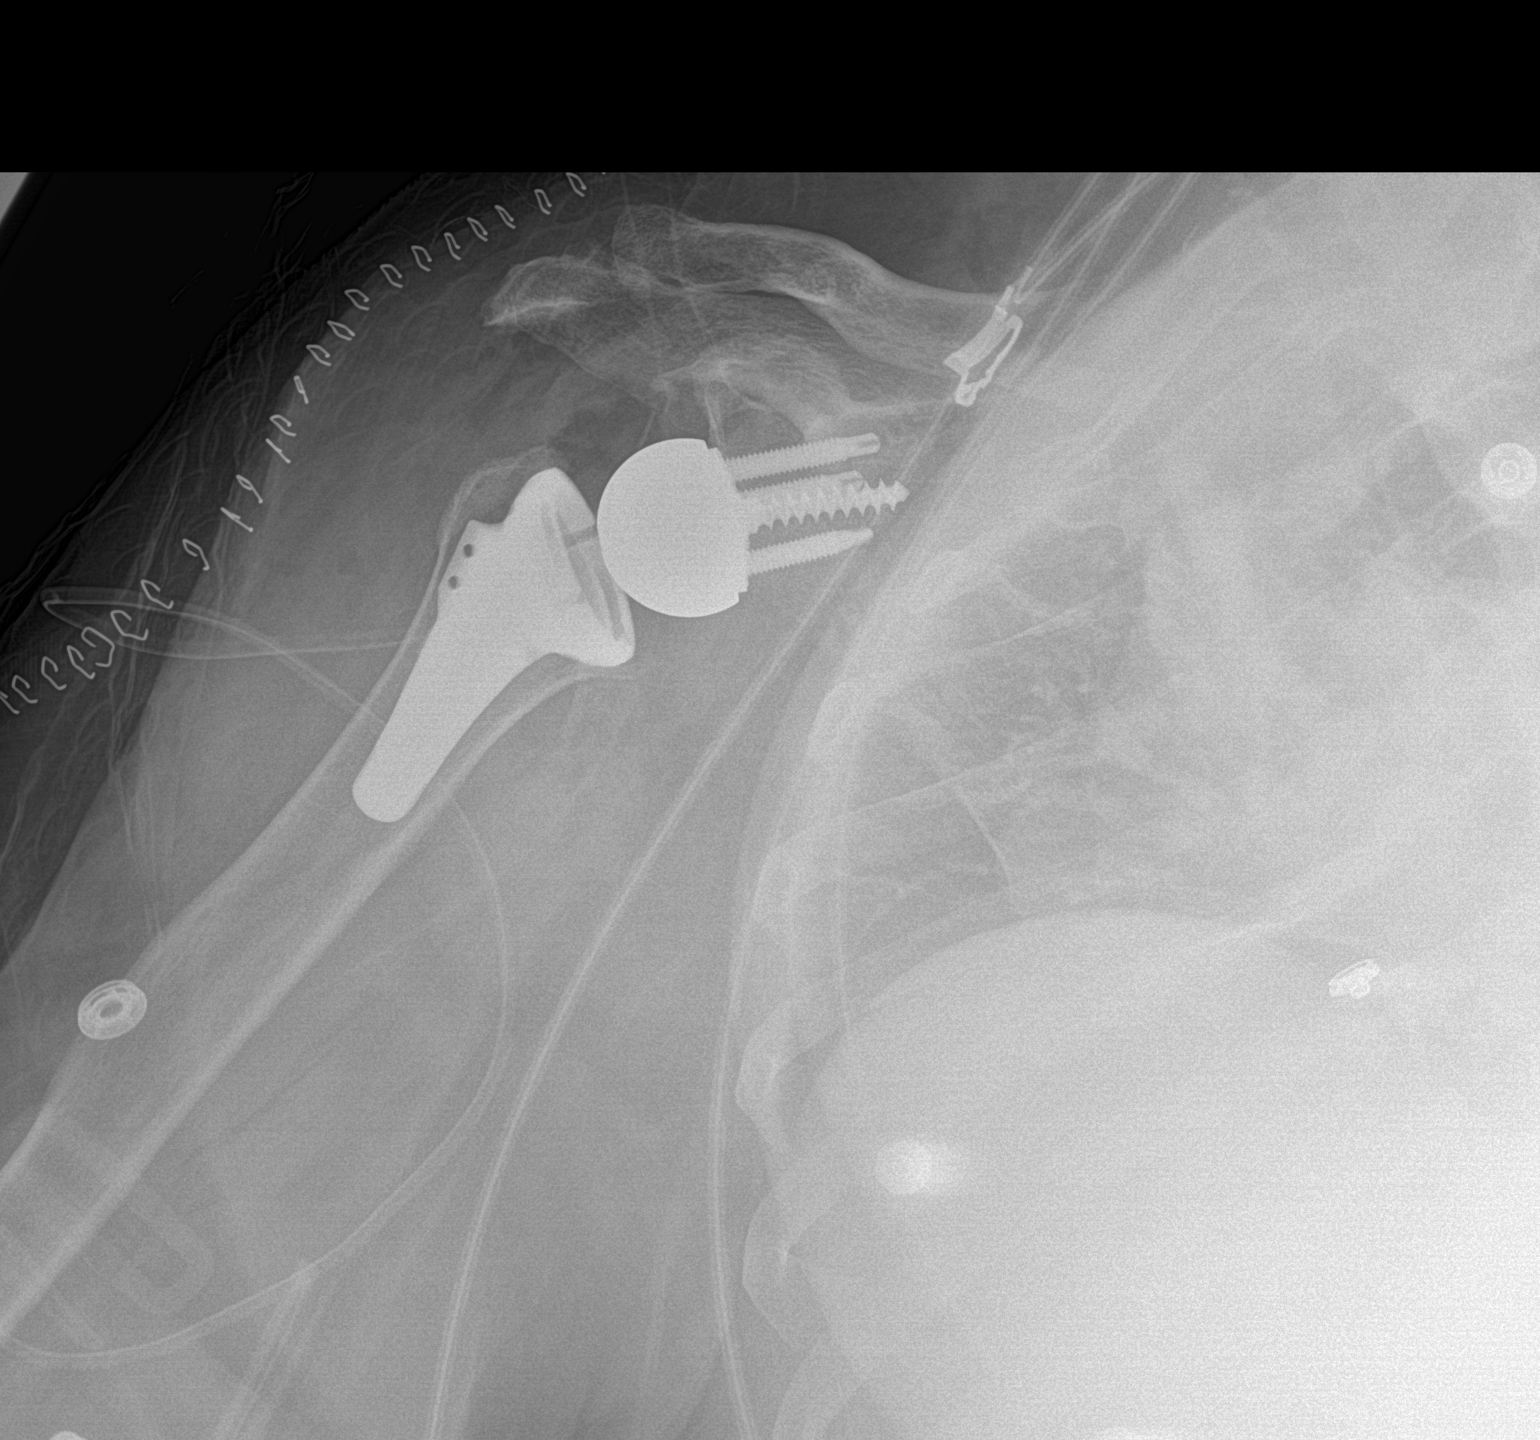

[1 of 1 positions shown; findings below may reference images not displayed]

FINDINGS: Reverse shoulder arthroplasty is in place. Device is located. No
fracture. Surgical staples noted.
IMPRESSION: Status post reverse shoulder arthroplasty today. No acute
abnormality.

## 2020-05-30 ENCOUNTER — Ambulatory Visit: Payer: No Typology Code available for payment source | Admitting: Physical Therapy

## 2020-05-30 ENCOUNTER — Encounter: Payer: Self-pay | Admitting: Physical Therapy

## 2020-05-30 ENCOUNTER — Other Ambulatory Visit: Payer: Self-pay

## 2020-05-30 DIAGNOSIS — R2689 Other abnormalities of gait and mobility: Secondary | ICD-10-CM

## 2020-05-30 DIAGNOSIS — G8929 Other chronic pain: Secondary | ICD-10-CM

## 2020-05-30 DIAGNOSIS — M25642 Stiffness of left hand, not elsewhere classified: Secondary | ICD-10-CM | POA: Diagnosis not present

## 2020-05-30 DIAGNOSIS — M25562 Pain in left knee: Secondary | ICD-10-CM

## 2020-05-30 DIAGNOSIS — R2681 Unsteadiness on feet: Secondary | ICD-10-CM

## 2020-05-30 NOTE — Therapy (Signed)
[facility] [address] [place], [place], [address] Phone: [phone]   Fax:  [phone]  Physical Therapy Treatment  Patient Details  Name: Katrina Hughes MRN: [id number] Date of Birth: 04/30/1950 Referring Provider (PT): Gomez Cleverly, MD   Encounter Date: 05/30/2020   PT End of Session - 05/30/20 2058    Visit Number 13    Number of Visits 19    Date for PT Re-Evaluation 07/02/20   POC for 6 weeks, Cert for 60 days   Authorization Type VA, 15 visits    Authorization Time Period 02/02/20-06/01/20    Authorization - Visit Number 12    Authorization - Number of Visits 15    PT Start Time 1330    PT Stop Time 1415    PT Time Calculation (min) 45 min    Equipment Utilized During Treatment Other (comment)   POOL NOODLE, ANKLE BUOYANCY CUFFS   Activity Tolerance Patient tolerated treatment well    Behavior During Therapy [facility] for tasks assessed/performed           Past Medical History:  Diagnosis Date  . Anginal pain ([place])   . Bell palsy   . CHF (congestive heart failure) ([place])   . Chronic kidney disease   . Complication of anesthesia    slow to wake up  . Diabetes mellitus without complication ([place])   . Dyspnea   . Environmental and seasonal allergies   . GERD (gastroesophageal reflux disease)   . Glaucoma   . Hypertension   . Hyperthyroidism    pt states that it's hypothyroidism  . Myocardial infarction ([place])   . Sleep apnea     Past Surgical History:  Procedure Laterality Date  . ABDOMINAL HYSTERECTOMY    . APPENDECTOMY    . BICEPT TENODESIS Left 06/10/2017   Procedure: BICEPS TENODESIS;  Surgeon: Leim Fabry, MD;  Location: [facility];  Service: Orthopedics;  Laterality: Left;  . BREAST REDUCTION SURGERY    . CATARACT EXTRACTION, BILATERAL Bilateral   . CHOLECYSTECTOMY    . CORONARY ANGIOPLASTY WITH STENT PLACEMENT    . HERNIA REPAIR    . NASAL HEMORRHAGE CONTROL N/A 09/17/2017   Procedure:  EPISTAXIS CONTROL;  Surgeon: [name], MD;  Location: [facility];  Service: ENT;  Laterality: N/A;  . REVERSE SHOULDER ARTHROPLASTY Left 06/10/2017   Procedure: REVERSE SHOULDER ARTHROPLASTY;  Surgeon: Leim Fabry, MD;  Location: [facility];  Service: Orthopedics;  Laterality: Left;  . REVERSE SHOULDER ARTHROPLASTY Right 11/03/2018   Procedure: REVERSE SHOULDER ARTHROPLASTY, BICEPS TENODESIS;  Surgeon: Leim Fabry, MD;  Location: [facility];  Service: Orthopedics;  Laterality: Right;    There were no vitals filed for this visit.   Subjective Assessment - 05/30/20 2057    Subjective Pt reports continuing to be anxious about pool but wishes to continue with additional sessions.  States significant relief from knee pain with aquatic sessions.  Asking about coming for more than 1x/week.    Patient is accompained by: Family member    Pertinent History Anginal pain, L Bell palsy, CHF, CKD, DM, dyspnea, seasonal allergies, GERD, glaucoma-had surgery to repair, MI (20+ years ago per pt), hyperthyroidism (per chart but pt states it's hypo), HTN, sleep apnea    How long can you stand comfortably? 20-25 minutes    How long can you walk comfortably? Unsure of time but stated about 87' before L knee pain incr.    Patient Stated Goals To go to grocery store without having  to ride electric scooter, improve balance to walk further, and decr. pain    Currently in Pain? No/denies            Aquatic therapy at [facility]   Patient seen for aquatic therapy today. Treatment took place in water 3.6-4.39feet deep depending upon activity. Pt entered and exited the pool viastepnegotiation with use of bil. Hand rails for support.; 6 steps - approx. 4" height  Pt performed bil. Hamstrings/heel cord stretch (runner's stretch) - 1 rep each leg for 30 sec hold  Pt gait trained in3.8' - 4.2'water holding onto pooledge.  Progressed to performing gait trainingholding onto noodle with bil.  UE's (noodle stabilized by PTA)   Pt performed forward, backward and side stepping gait with edge then noodle.  Pt sat on bench in pool - LAQ's 15 repssetseach leg ; seated hip flexion 105reps setseach leg; repeated LAQ and seated hip flexion with ankle buoyancy cuffs.  Performed LAQ with opposite arm lift x 10 alternating sides.  sit to stand from bench with 1 UE support with CGAx 10 reps  Pt performed standing hip flexion, abduction, and extension 10 reps each legwith ankle buoyancy cuffs andviscosity of water for resistance. Pt using 1-2 UE support at pool edge.  Pt performed gait with close SBA of PTA in 4 ft of water without any UE support. Worked on increasing UE arm swing/relaxation with gait along with turns while maintaining balance.  Pt requires buoyancy of water for support and for joint off loading for reduced pain with weight bearing exercise; pt requires viscosity of water for resistance for strengthening; Gait training with less support is able to be performed in the water with increased safety with reduced fall risk than is able to be Performed on land        PT Short Term Goals - 05/03/20 1551      PT SHORT TERM GOAL #1   Title Patient will initiate aquatic therapy (ALL STGs Due: 05/24/20)    Baseline waiting to begin aquatics.    Time 3    Period Weeks    Status New    Target Date 05/24/20             PT Long Term Goals - 05/03/20 1548      PT LONG TERM GOAL #1   Title Patient will be independent with land/aquatic HEP for improved strengthening/ balance (ALL LTGS Due: 06/14/2020)    Baseline Land HEP provided    Time 6    Period Weeks    Status New    Target Date 06/14/20      PT LONG TERM GOAL #2   Title Pt will amb. 500' on outdoor/unlevel surfaces at MOD I level with LRAD with </=5/10 knee pain to improve functional mobility.    Baseline Patient ambulate 550' indoors with rollator (4/10 pain)    Time 6    Period Weeks    Status Revised       PT LONG TERM GOAL #3   Title Pt will be able to attempt bowling in order to improve QOL.    Baseline has not returned to bowling at this time    Time 6    Period Weeks    Status On-going      PT LONG TERM GOAL #4   Title Patient will improve DGI to >/= 19 to demonstrate improved balance and reduced fall risk    Baseline 15/24    Time 6  Period Weeks    Status New                 Plan - 05/30/20 2059    Clinical Impression Statement Pt progress ambulation today to short distances with close SBA of PTA.  Pt continues to be fearful of pool/water but works hard during session.  Reporting significant relief from knee pain after her sessions.  Cont per poc.    Personal Factors and Comorbidities Age;Fitness;Comorbidity 3+    Comorbidities Anginal pain, L Bell palsy, CHF, CKD, DM, dyspnea, seasonal allergies, GERD, glaucoma-had surgery to repair, MI (20+ years ago per pt), hyperthyroidism (per chart but pt states it's hypo), HTN, sleep apnea    Examination-Activity Limitations Bed Mobility;Bend;Carry;Continence;Toileting;Stand;Stairs;Squat;Locomotion Level;Transfers    Examination-Participation Restrictions Driving;Meal Prep;Interpersonal Relationship;Laundry    Stability/Clinical Decision Making Evolving/Moderate complexity    Rehab Potential Good    PT Frequency 2x / week    PT Duration 3 weeks   followed by 1x/week for 3 (allow for time to be worked into aquatic therapy)   PT Treatment/Interventions ADLs/Self Care Home Management;Aquatic Therapy;Biofeedback;Canalith Repostioning;DME Instruction;Balance training;Therapeutic exercise;Therapeutic activities;Functional mobility training;Stair training;Gait training;Neuromuscular re-education;Patient/family education;Orthotic Fit/Training;Manual techniques;Vestibular    PT Next Visit Plan Continue aquatic PT    PT Home Exercise Plan Medbridge: [email or web address]    Consulted and Agree with Plan of Care Patient           Patient will benefit  from skilled therapeutic intervention in order to improve the following deficits and impairments:  Abnormal gait,Decreased range of motion,Difficulty walking,Dizziness,Decreased knowledge of precautions,Decreased endurance,Decreased balance,Decreased knowledge of use of DME,Decreased mobility,Decreased strength,Increased edema,Postural dysfunction,Impaired flexibility,Pain  Visit Diagnosis: Chronic pain of both knees  Other abnormalities of gait and mobility  Unsteadiness on feet     Problem List Patient Active Problem List   Diagnosis Date Noted  . 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 Status post shoulder replacement 06/10/2017    Narda Bonds, [place] 05/30/20 9:05 PM Phone: [phone] Fax: [facility] [address] [place], [place], [address] Phone: [phone]   Fax:  [phone]  Name: Nathaly Dawkins MRN: [id number] Date of Birth: [birth year]

## 2020-06-02 ENCOUNTER — Ambulatory Visit: Payer: No Typology Code available for payment source | Admitting: Occupational Therapy

## 2020-06-06 ENCOUNTER — Ambulatory Visit: Payer: No Typology Code available for payment source | Admitting: Physical Therapy

## 2021-12-15 ENCOUNTER — Encounter (HOSPITAL_COMMUNITY): Payer: Self-pay

## 2021-12-15 ENCOUNTER — Ambulatory Visit (HOSPITAL_COMMUNITY)
Admission: EM | Admit: 2021-12-15 | Discharge: 2021-12-15 | Disposition: A | Discharge status: Home / Self Care | Payer: Non-veteran care | Attending: Physician Assistant | Admitting: Physician Assistant

## 2021-12-15 DIAGNOSIS — K047 Periapical abscess without sinus: Secondary | ICD-10-CM

## 2021-12-15 DIAGNOSIS — K1379 Other lesions of oral mucosa: Secondary | ICD-10-CM | POA: Diagnosis not present

## 2021-12-15 MED ORDER — NYSTATIN 100000 UNIT/ML MT SUSP: 5.0000 mL | Freq: Four times a day (QID) | OROMUCOSAL | 0 refills | Status: AC | PRN

## 2021-12-15 MED ORDER — AMOXICILLIN-POT CLAVULANATE 500-125 MG PO TABS: 1.0000 | ORAL_TABLET | Freq: Two times a day (BID) | ORAL | 0 refills | Status: DC

## 2021-12-15 NOTE — ED Provider Notes (Signed)
Katrina Hughes    CSN: [id number] Arrival date & time: 12/15/21  1239      History   Chief Complaint Chief Complaint  Patient presents with   mouth pain    HPI Katrina Hughes is a 71 y.o. female.   Patient presents today with a 2-day history of significant swelling in her mouth with associated pain.  She has previously had all of her top teeth and most of her bottom teeth removed and uses dentures.  Unfortunately, she has been unable to wear her dentures for the past 2 days due to associated swelling and pain.  She is confident she is cleaning them normally and denies any new products.  She has taken Tylenol without improvement of symptoms.  She has also been using salt water and ice water for symptom relief.  She denies any difficulty speaking, swallowing, shortness of breath, swelling of her throat, fever.  She denies any recent dental procedure.  Denies any recent antibiotic use.  She is having difficulty with daily activities as result of the pain.  She denies history of chemotherapy use, immunosuppression, diabetes.  Denies any known sick contacts.    Past Medical History:  Diagnosis Date   Anginal pain ([place])    Bell palsy    CHF (congestive heart failure) ([facility])    Chronic kidney disease    Complication of anesthesia    slow to wake up   Diabetes mellitus without complication ([facility])    Dyspnea    Environmental and seasonal allergies    GERD (gastroesophageal reflux disease)    Glaucoma    Hypertension    Hyperthyroidism    pt states that it's hypothyroidism   Myocardial infarction [facility])    Sleep apnea     Patient Active Problem List   Diagnosis Date Noted   S/p reverse total shoulder arthroplasty 11/03/2018   Status post shoulder replacement 06/10/2017    Past Surgical History:  Procedure Laterality Date   ABDOMINAL HYSTERECTOMY     APPENDECTOMY     BICEPT TENODESIS Left 06/10/2017   Procedure: BICEPS TENODESIS;  Surgeon: Leim Fabry, MD;  Location:  [facility];  Service: Orthopedics;  Laterality: Left;   BREAST REDUCTION SURGERY     CATARACT EXTRACTION, BILATERAL Bilateral    CHOLECYSTECTOMY     CORONARY ANGIOPLASTY WITH STENT PLACEMENT     HERNIA REPAIR     NASAL HEMORRHAGE CONTROL N/A 09/17/2017   Procedure: EPISTAXIS CONTROL;  Surgeon: [name], MD;  Location: [facility];  Service: ENT;  Laterality: N/A;   REVERSE SHOULDER ARTHROPLASTY Left 06/10/2017   Procedure: REVERSE SHOULDER ARTHROPLASTY;  Surgeon: Leim Fabry, MD;  Location: [facility];  Service: Orthopedics;  Laterality: Left;   REVERSE SHOULDER ARTHROPLASTY Right 11/03/2018   Procedure: REVERSE SHOULDER ARTHROPLASTY, BICEPS TENODESIS;  Surgeon: Leim Fabry, MD;  Location: [facility];  Service: Orthopedics;  Laterality: Right;    OB History   No obstetric history on file.      Home Medications    Prior to Admission medications   Medication Sig Start Date End Date Taking? Authorizing Provider  amoxicillin-clavulanate (AUGMENTIN) 500-125 MG tablet Take 1 tablet (500 mg total) by mouth 2 (two) times daily. 12/15/21  Yes Lylia Karn, Derry Skill, PA-C  magic mouthwash (nystatin, lidocaine, diphenhydrAMINE) suspension Take 5 mLs by mouth 4 (four) times daily as needed for mouth pain. Swish and spit 12/15/21  Yes Taylinn Brabant K, PA-C  acetaminophen (TYLENOL) 325 MG tablet Take 650-1,300 mg by mouth See admin  instructions. Take 1300 mg by mouth twice daily. Take 650 mg by mouth twice daily as needed for pain or headache.    [provider]  albuterol (PROVENTIL HFA;VENTOLIN HFA) 108 (90 Base) MCG/ACT inhaler Inhale 2 puffs into the lungs every 6 (six) hours as needed for wheezing or shortness of breath.    [provider]  allopurinol (ZYLOPRIM) 100 MG tablet Take 100 mg by mouth 2 (two) times daily.     [provider]  atorvastatin (LIPITOR) 80 MG tablet Take 40 mg by mouth at bedtime.    [provider]  brimonidine (ALPHAGAN) 0.2 % ophthalmic solution Place 1  drop into both eyes 2 (two) times a day.    [provider]  budesonide-formoterol (SYMBICORT) 160-4.5 MCG/ACT inhaler Inhale 2 puffs into the lungs 2 (two) times a day.     [provider]  carvedilol (COREG) 12.5 MG tablet Take 12.5 mg by mouth 2 (two) times daily with a meal.     [provider]  diclofenac sodium (VOLTAREN) 1 % GEL Apply 1 application topically 2 (two) times a day.    [provider]  Difluprednate 0.05 % EMUL Place 1 drop into the left eye 4 (four) times daily.    [provider]  enoxaparin (LOVENOX) 40 MG/0.4ML injection Inject 0.4 mLs (40 mg total) into the skin daily for 14 doses. 11/04/18 11/18/18  Reche Dixon, PA-C  furosemide (LASIX) 40 MG tablet Take 40 mg by mouth 2 (two) times daily.     [provider]  insulin aspart (NOVOLOG) 100 UNIT/ML injection Inject 3-10 Units into the skin 3 (three) times daily with meals.     [provider]  insulin glargine (LANTUS) 100 UNIT/ML injection Inject 38 Units into the skin at bedtime.     [provider]  lactase (LACTAID) 3000 units tablet Take 3,000 Units by mouth 3 (three) times daily with meals. Patient not taking: Reported on 03/10/2020    [provider]  lactobacillus acidophilus & bulgar (LACTINEX) chewable tablet Chew 1 tablet by mouth 2 (two) times daily.    [provider]  latanoprost (XALATAN) 0.005 % ophthalmic solution Place 1 drop into both eyes at bedtime.    [provider]  methimazole (TAPAZOLE) 5 MG tablet Take 5 mg by mouth daily.    [provider]  nitroGLYCERIN (NITRODUR - DOSED IN MG/24 HR) 0.6 mg/hr patch Place 0.6 mg onto the skin daily. Places at [place], removes at 10p    [provider]  ondansetron (ZOFRAN) 4 MG tablet Take 1 tablet (4 mg total) by mouth every 6 (six) hours as needed for nausea. 11/04/18   Reche Dixon, PA-C  oxyCODONE (OXY IR/ROXICODONE) 5 MG immediate release tablet Take  1-2 tablets (5-10 mg total) by mouth every 4 (four) hours as needed for moderate pain (pain score 4-6). Patient not taking: Reported on 03/10/2020 11/04/18   Reche Dixon, PA-C  pantoprazole (PROTONIX) 20 MG tablet Take 20 mg by mouth 2 (two) times daily.    [provider]  senna-docusate (SENOKOT-S) 8.6-50 MG tablet Take 1 tablet by mouth 2 (two) times daily.    [provider]  sodium chloride (OCEAN) 0.65 % SOLN nasal spray Place 2 sprays into both nostrils daily as needed for congestion.     [provider]  spironolactone (ALDACTONE) 25 MG tablet Take 25 mg by mouth daily.     [provider]  timolol (BETIMOL) 0.5 % ophthalmic  solution Place 1 drop into both eyes daily.     [provider]  topiramate (TOPAMAX) 100 MG tablet Take 100 mg by mouth every evening.    [provider]    Family History History reviewed. No pertinent family history.  Social History Social History   Tobacco Use   Smoking status: Former    Types: Cigarettes    Quit date: 1980    Years since quitting: 43.7   Smokeless tobacco: Never  Vaping Use   Vaping Use: Former  Substance Use Topics   Alcohol use: Yes    Alcohol/week: 1.0 standard drink of alcohol    Types: 1 Cans of beer per week    Comment: occassionally    Drug use: Not Currently    Types: Marijuana     Allergies   Elemental sulfur and Aspirin   Review of Systems Review of Systems  Constitutional:  Positive for activity change. Negative for appetite change, fatigue and fever.  HENT:  Positive for dental problem. Negative for congestion, sinus pressure, sneezing, sore throat, trouble swallowing and voice change.   Respiratory:  Negative for cough and shortness of breath.   Cardiovascular:  Negative for chest pain.  Gastrointestinal:  Negative for abdominal pain, diarrhea, nausea and vomiting.     Physical Exam Triage Vital Signs ED Triage Vitals [12/15/21 1359]  Enc Vitals Group      BP 121/78     Pulse Rate 89     Resp 18     Temp 99.2 F (37.3 C)     Temp Source Oral     SpO2 96 %     Weight      Height      Head Circumference      Peak Flow      Pain Score 8     Pain Loc      Pain Edu?      Excl. in [place]?    No data found.  Updated Vital Signs BP 121/78 (BP Location: Right Arm)   Pulse 89   Temp 99.2 F (37.3 C) (Oral)   Resp 18   SpO2 96%   Visual Acuity Right Eye Distance:   Left Eye Distance:   Bilateral Distance:    Right Eye Near:   Left Eye Near:    Bilateral Near:     Physical Exam Vitals reviewed.  Constitutional:      General: She is awake. She is not in acute distress.    Appearance: Normal appearance. She is well-developed. She is not ill-appearing.     Comments: Very pleasant female appears stated age in no acute distress sitting comfortably in exam room  HENT:     Head: Normocephalic and atraumatic.     Right Ear: Tympanic membrane, ear canal and external ear normal. Tympanic membrane is not erythematous or bulging.     Left Ear: Tympanic membrane, ear canal and external ear normal. Tympanic membrane is not erythematous or bulging.     Nose:     Right Sinus: No maxillary sinus tenderness or frontal sinus tenderness.     Left Sinus: No maxillary sinus tenderness or frontal sinus tenderness.     Mouth/Throat:     Mouth: Mucous membranes are moist.     Dentition: Abnormal dentition. Has dentures. Gingival swelling present. No dental caries, dental abscesses or gum lesions.     Pharynx: Uvula midline. No oropharyngeal exudate or posterior oropharyngeal erythema.     Comments: Swelling noted of  upper gingiva.  No obvious abscess noted.  No lesions or ulcerations noted. Cardiovascular:     Rate and Rhythm: Normal rate and regular rhythm.     Heart sounds: Normal heart sounds, S1 normal and S2 normal. No murmur heard. Pulmonary:     Effort: Pulmonary effort is normal.     Breath sounds: Normal breath sounds. No wheezing,  rhonchi or rales.     Comments: Clear to auscultation bilaterally Lymphadenopathy:     Head:     Right side of head: No submental, submandibular or tonsillar adenopathy.     Left side of head: No submental, submandibular or tonsillar adenopathy.     Cervical: No cervical adenopathy.  Psychiatric:        Behavior: Behavior is cooperative.      UC Treatments / Results  Labs (all labs ordered are listed, but only abnormal results are displayed) Labs Reviewed - No data to display  EKG   Radiology No results found.  Procedures Procedures (including critical care time)  Medications Ordered in UC Medications - No data to display  Initial Impression / Assessment and Plan / UC Course  I have reviewed the triage vital signs and the nursing notes.  Pertinent labs & imaging results that were available during my care of the patient were reviewed by me and considered in my medical decision making (see chart for details).     We will cover for infection given associated swelling and increased pain.  Patient did not have any recent blood work available in her chart but it is noted in community care document that she has chronic kidney disease.  We will decrease dose of Augmentin in line with suspected lower GFR.  She was also started on Magic mouthwash with instruction to swish and spit.  Discussed that she can use Tylenol for pain relief and encouraged her to continue gargling with warm salt water and using ice water for symptom relief.  She is to ensure she is cleaning dentures adequately in case this is contributing to symptoms.  Discussed that if she has any worsening symptoms she needs to be seen immediately including swelling of her throat, shortness of breath, fever, nausea, vomiting, increased pain.  Recommend close follow-up with her primary care/dentist.  Strict return precautions given.  Final Clinical Impressions(s) / UC Diagnoses   Final diagnoses:  Dental infection  Mouth  pain     Discharge Instructions      Take Augmentin twice daily for 7 days.  Gargle with warm salt water and drink ice cold water to help with your symptoms.  You can use Tylenol for pain relief.  Use Magic mouthwash up to 4 times a day.  Make sure to swish and spit.  Follow-up with your primary care and dentist.  If you have any worsening symptoms you need to be seen immediately.     ED Prescriptions     Medication Sig Dispense Auth. Provider   amoxicillin-clavulanate (AUGMENTIN) 500-125 MG tablet Take 1 tablet (500 mg total) by mouth 2 (two) times daily. 14 tablet Johari Bennetts K, PA-C   magic mouthwash (nystatin, lidocaine, diphenhydrAMINE) suspension Take 5 mLs by mouth 4 (four) times daily as needed for mouth pain. Swish and spit 180 mL Lateka Rady K, PA-C      PDMP not reviewed this encounter.   Terrilee Croak, PA-C 12/15/21 1433

## 2021-12-15 NOTE — ED Triage Notes (Signed)
Pt c/o pain to her mouth/cheeks/jaw are x2 days. States now having sore throat and ear pain. States can't wear her dentures d/t pain. Took tylenol for pain' \\and'$  gargled with salt water.

## 2021-12-15 NOTE — Discharge Instructions (Signed)
Take Augmentin twice daily for 7 days.  Gargle with warm salt water and drink ice cold water to help with your symptoms.  You can use Tylenol for pain relief.  Use Magic mouthwash up to 4 times a day.  Make sure to swish and spit.  Follow-up with your primary care and dentist.  If you have any worsening symptoms you need to be seen immediately.

## 2022-01-25 ENCOUNTER — Emergency Department (HOSPITAL_COMMUNITY): Payer: No Typology Code available for payment source

## 2022-01-25 ENCOUNTER — Encounter (HOSPITAL_COMMUNITY): Payer: Self-pay | Admitting: Emergency Medicine

## 2022-01-25 ENCOUNTER — Other Ambulatory Visit: Payer: Self-pay

## 2022-01-25 ENCOUNTER — Inpatient Hospital Stay (HOSPITAL_COMMUNITY)
Admission: EM | Admit: 2022-01-25 | Discharge: 2022-02-07 | DRG: 808 | Disposition: E | Payer: No Typology Code available for payment source | Attending: Pulmonary Disease | Admitting: Pulmonary Disease

## 2022-01-25 DIAGNOSIS — Z87891 Personal history of nicotine dependence: Secondary | ICD-10-CM

## 2022-01-25 DIAGNOSIS — E0590 Thyrotoxicosis, unspecified without thyrotoxic crisis or storm: Secondary | ICD-10-CM | POA: Diagnosis present

## 2022-01-25 DIAGNOSIS — I130 Hypertensive heart and chronic kidney disease with heart failure and stage 1 through stage 4 chronic kidney disease, or unspecified chronic kidney disease: Secondary | ICD-10-CM | POA: Diagnosis present

## 2022-01-25 DIAGNOSIS — I4891 Unspecified atrial fibrillation: Secondary | ICD-10-CM | POA: Diagnosis present

## 2022-01-25 DIAGNOSIS — D61818 Other pancytopenia: Principal | ICD-10-CM | POA: Diagnosis present

## 2022-01-25 DIAGNOSIS — I509 Heart failure, unspecified: Secondary | ICD-10-CM | POA: Diagnosis present

## 2022-01-25 DIAGNOSIS — E872 Acidosis, unspecified: Secondary | ICD-10-CM | POA: Diagnosis present

## 2022-01-25 DIAGNOSIS — Z90710 Acquired absence of both cervix and uterus: Secondary | ICD-10-CM

## 2022-01-25 DIAGNOSIS — Z6827 Body mass index (BMI) 27.0-27.9, adult: Secondary | ICD-10-CM

## 2022-01-25 DIAGNOSIS — G4733 Obstructive sleep apnea (adult) (pediatric): Secondary | ICD-10-CM | POA: Diagnosis present

## 2022-01-25 DIAGNOSIS — Z96612 Presence of left artificial shoulder joint: Secondary | ICD-10-CM | POA: Diagnosis present

## 2022-01-25 DIAGNOSIS — Z888 Allergy status to other drugs, medicaments and biological substances status: Secondary | ICD-10-CM

## 2022-01-25 DIAGNOSIS — Z66 Do not resuscitate: Secondary | ICD-10-CM | POA: Diagnosis not present

## 2022-01-25 DIAGNOSIS — H409 Unspecified glaucoma: Secondary | ICD-10-CM | POA: Diagnosis present

## 2022-01-25 DIAGNOSIS — E870 Hyperosmolality and hypernatremia: Secondary | ICD-10-CM | POA: Diagnosis present

## 2022-01-25 DIAGNOSIS — D649 Anemia, unspecified: Secondary | ICD-10-CM | POA: Diagnosis not present

## 2022-01-25 DIAGNOSIS — E1122 Type 2 diabetes mellitus with diabetic chronic kidney disease: Secondary | ICD-10-CM | POA: Diagnosis present

## 2022-01-25 DIAGNOSIS — D696 Thrombocytopenia, unspecified: Secondary | ICD-10-CM

## 2022-01-25 DIAGNOSIS — I252 Old myocardial infarction: Secondary | ICD-10-CM

## 2022-01-25 DIAGNOSIS — R079 Chest pain, unspecified: Secondary | ICD-10-CM | POA: Diagnosis present

## 2022-01-25 DIAGNOSIS — I959 Hypotension, unspecified: Secondary | ICD-10-CM | POA: Diagnosis present

## 2022-01-25 DIAGNOSIS — D62 Acute posthemorrhagic anemia: Secondary | ICD-10-CM | POA: Diagnosis present

## 2022-01-25 DIAGNOSIS — T451X5A Adverse effect of antineoplastic and immunosuppressive drugs, initial encounter: Secondary | ICD-10-CM | POA: Diagnosis present

## 2022-01-25 DIAGNOSIS — J9600 Acute respiratory failure, unspecified whether with hypoxia or hypercapnia: Secondary | ICD-10-CM | POA: Diagnosis not present

## 2022-01-25 DIAGNOSIS — N179 Acute kidney failure, unspecified: Secondary | ICD-10-CM | POA: Diagnosis present

## 2022-01-25 DIAGNOSIS — K219 Gastro-esophageal reflux disease without esophagitis: Secondary | ICD-10-CM | POA: Diagnosis present

## 2022-01-25 DIAGNOSIS — K1230 Oral mucositis (ulcerative), unspecified: Secondary | ICD-10-CM | POA: Diagnosis present

## 2022-01-25 DIAGNOSIS — Z955 Presence of coronary angioplasty implant and graft: Secondary | ICD-10-CM

## 2022-01-25 DIAGNOSIS — R197 Diarrhea, unspecified: Secondary | ICD-10-CM | POA: Diagnosis present

## 2022-01-25 DIAGNOSIS — Z1152 Encounter for screening for COVID-19: Secondary | ICD-10-CM

## 2022-01-25 DIAGNOSIS — N184 Chronic kidney disease, stage 4 (severe): Secondary | ICD-10-CM | POA: Diagnosis present

## 2022-01-25 DIAGNOSIS — Z96611 Presence of right artificial shoulder joint: Secondary | ICD-10-CM | POA: Diagnosis present

## 2022-01-25 DIAGNOSIS — K121 Other forms of stomatitis: Secondary | ICD-10-CM | POA: Diagnosis present

## 2022-01-25 DIAGNOSIS — R471 Dysarthria and anarthria: Secondary | ICD-10-CM | POA: Diagnosis present

## 2022-01-25 DIAGNOSIS — J302 Other seasonal allergic rhinitis: Secondary | ICD-10-CM | POA: Diagnosis present

## 2022-01-25 DIAGNOSIS — E861 Hypovolemia: Secondary | ICD-10-CM | POA: Diagnosis present

## 2022-01-25 DIAGNOSIS — R34 Anuria and oliguria: Secondary | ICD-10-CM | POA: Diagnosis not present

## 2022-01-25 DIAGNOSIS — E538 Deficiency of other specified B group vitamins: Secondary | ICD-10-CM | POA: Diagnosis present

## 2022-01-25 DIAGNOSIS — Z794 Long term (current) use of insulin: Secondary | ICD-10-CM

## 2022-01-25 DIAGNOSIS — I2510 Atherosclerotic heart disease of native coronary artery without angina pectoris: Secondary | ICD-10-CM | POA: Diagnosis present

## 2022-01-25 DIAGNOSIS — Z79899 Other long term (current) drug therapy: Secondary | ICD-10-CM

## 2022-01-25 DIAGNOSIS — E039 Hypothyroidism, unspecified: Secondary | ICD-10-CM | POA: Diagnosis present

## 2022-01-25 DIAGNOSIS — Z515 Encounter for palliative care: Secondary | ICD-10-CM

## 2022-01-25 DIAGNOSIS — Z9049 Acquired absence of other specified parts of digestive tract: Secondary | ICD-10-CM

## 2022-01-25 DIAGNOSIS — Z7951 Long term (current) use of inhaled steroids: Secondary | ICD-10-CM

## 2022-01-25 DIAGNOSIS — E43 Unspecified severe protein-calorie malnutrition: Secondary | ICD-10-CM | POA: Diagnosis present

## 2022-01-25 LAB — CBG MONITORING, ED: Glucose-Capillary: 207 mg/dL — ABNORMAL HIGH (ref 70–99)

## 2022-01-25 MED ORDER — SODIUM CHLORIDE 0.9 % IV BOLUS
1000.0000 mL | Freq: Once | INTRAVENOUS | Status: AC
  Administered 2022-01-26: 1000 mL via INTRAVENOUS

## 2022-01-25 NOTE — ED Triage Notes (Signed)
Patient reports central chest pain with SOB onset Sunday , no cough or fever , denies emesis or diaphoresis .

## 2022-01-25 NOTE — ED Provider Triage Note (Signed)
Emergency Medicine Provider Triage Evaluation Note  Katrina Hughes , a 71 y.o. female  was evaluated in triage.  Pt complains of shortness of breath.  Patient reports ongoing shortness of breath since Sunday.  States that she has had difficulty eating and sleeping due to it.  She reports swelling of lower extremities.  She reports that she was here in the ER for the same complaint several days ago however I see no record of this.  She reports a history of heart failure "sometimes".  She denies any cough fevers or chills.  Endorses intermittent chest pain. Review of Systems  Positive: As above Negative: As above  Physical Exam  There were no vitals taken for this visit. Gen:   Awake, no distress  Resp:  Normal effort  MSK:   Moves extremities without difficulty  Other:  Speaking in full sentences, no wheezes or rhonchi.  Medical Decision Making  Medically screening exam initiated at 11:09 PM.  Appropriate orders placed.  Alajia Schmelzer was informed that the remainder of the evaluation will be completed by another provider, this initial triage assessment does not replace that evaluation, and the importance of remaining in the ED until their evaluation is complete.     Garald Balding, PA-C 01/11/2022 2311

## 2022-01-26 ENCOUNTER — Encounter (HOSPITAL_COMMUNITY): Payer: Self-pay | Admitting: Pulmonary Disease

## 2022-01-26 ENCOUNTER — Emergency Department (HOSPITAL_COMMUNITY): Payer: No Typology Code available for payment source

## 2022-01-26 ENCOUNTER — Inpatient Hospital Stay (HOSPITAL_COMMUNITY)
Admit: 2022-01-26 | Discharge: 2022-01-26 | Disposition: A | Discharge status: Home / Self Care | Payer: No Typology Code available for payment source | Attending: Physician Assistant | Admitting: Physician Assistant

## 2022-01-26 DIAGNOSIS — E0590 Thyrotoxicosis, unspecified without thyrotoxic crisis or storm: Secondary | ICD-10-CM | POA: Diagnosis present

## 2022-01-26 DIAGNOSIS — I2510 Atherosclerotic heart disease of native coronary artery without angina pectoris: Secondary | ICD-10-CM | POA: Diagnosis present

## 2022-01-26 DIAGNOSIS — D61818 Other pancytopenia: Secondary | ICD-10-CM

## 2022-01-26 DIAGNOSIS — I4891 Unspecified atrial fibrillation: Secondary | ICD-10-CM | POA: Diagnosis present

## 2022-01-26 DIAGNOSIS — E1122 Type 2 diabetes mellitus with diabetic chronic kidney disease: Secondary | ICD-10-CM | POA: Diagnosis present

## 2022-01-26 DIAGNOSIS — I252 Old myocardial infarction: Secondary | ICD-10-CM | POA: Diagnosis not present

## 2022-01-26 DIAGNOSIS — D696 Thrombocytopenia, unspecified: Secondary | ICD-10-CM | POA: Insufficient documentation

## 2022-01-26 DIAGNOSIS — E872 Acidosis, unspecified: Secondary | ICD-10-CM | POA: Diagnosis present

## 2022-01-26 DIAGNOSIS — N179 Acute kidney failure, unspecified: Secondary | ICD-10-CM | POA: Diagnosis present

## 2022-01-26 DIAGNOSIS — Z515 Encounter for palliative care: Secondary | ICD-10-CM | POA: Diagnosis not present

## 2022-01-26 DIAGNOSIS — D649 Anemia, unspecified: Secondary | ICD-10-CM | POA: Diagnosis present

## 2022-01-26 DIAGNOSIS — N189 Chronic kidney disease, unspecified: Secondary | ICD-10-CM | POA: Insufficient documentation

## 2022-01-26 DIAGNOSIS — I130 Hypertensive heart and chronic kidney disease with heart failure and stage 1 through stage 4 chronic kidney disease, or unspecified chronic kidney disease: Secondary | ICD-10-CM | POA: Diagnosis present

## 2022-01-26 DIAGNOSIS — Z794 Long term (current) use of insulin: Secondary | ICD-10-CM | POA: Diagnosis not present

## 2022-01-26 DIAGNOSIS — E43 Unspecified severe protein-calorie malnutrition: Secondary | ICD-10-CM | POA: Diagnosis present

## 2022-01-26 DIAGNOSIS — R079 Chest pain, unspecified: Secondary | ICD-10-CM | POA: Diagnosis present

## 2022-01-26 DIAGNOSIS — J9600 Acute respiratory failure, unspecified whether with hypoxia or hypercapnia: Secondary | ICD-10-CM | POA: Diagnosis not present

## 2022-01-26 DIAGNOSIS — Z66 Do not resuscitate: Secondary | ICD-10-CM | POA: Diagnosis not present

## 2022-01-26 DIAGNOSIS — I959 Hypotension, unspecified: Secondary | ICD-10-CM | POA: Diagnosis present

## 2022-01-26 DIAGNOSIS — D62 Acute posthemorrhagic anemia: Secondary | ICD-10-CM | POA: Diagnosis present

## 2022-01-26 DIAGNOSIS — K1230 Oral mucositis (ulcerative), unspecified: Secondary | ICD-10-CM | POA: Insufficient documentation

## 2022-01-26 DIAGNOSIS — R34 Anuria and oliguria: Secondary | ICD-10-CM | POA: Diagnosis not present

## 2022-01-26 DIAGNOSIS — I509 Heart failure, unspecified: Secondary | ICD-10-CM | POA: Diagnosis present

## 2022-01-26 DIAGNOSIS — Z1152 Encounter for screening for COVID-19: Secondary | ICD-10-CM | POA: Diagnosis not present

## 2022-01-26 DIAGNOSIS — E861 Hypovolemia: Secondary | ICD-10-CM | POA: Diagnosis present

## 2022-01-26 DIAGNOSIS — N184 Chronic kidney disease, stage 4 (severe): Secondary | ICD-10-CM | POA: Diagnosis present

## 2022-01-26 DIAGNOSIS — E870 Hyperosmolality and hypernatremia: Secondary | ICD-10-CM | POA: Diagnosis present

## 2022-01-26 DIAGNOSIS — R5383 Other fatigue: Secondary | ICD-10-CM

## 2022-01-26 DIAGNOSIS — E039 Hypothyroidism, unspecified: Secondary | ICD-10-CM | POA: Diagnosis present

## 2022-01-26 LAB — CBC
HCT: 17.6 % — ABNORMAL LOW (ref 36.0–46.0)
Hemoglobin: 5.3 g/dL — CL (ref 12.0–15.0)
MCH: 31.2 pg (ref 26.0–34.0)
MCHC: 30.1 g/dL (ref 30.0–36.0)
MCV: 103.5 fL — ABNORMAL HIGH (ref 80.0–100.0)
Platelets: <5 10*3/uL — CL (ref 150–400)
RBC: 1.7 MIL/uL — ABNORMAL LOW (ref 3.87–5.11)
RDW: 16.1 % — ABNORMAL HIGH (ref 11.5–15.5)
WBC: 1.7 10*3/uL — ABNORMAL LOW (ref 4.0–10.5)
nRBC: 0 % (ref 0.0–0.2)

## 2022-01-26 LAB — GLUCOSE, CAPILLARY
Glucose-Capillary: 133 mg/dL — ABNORMAL HIGH (ref 70–99)
Glucose-Capillary: 143 mg/dL — ABNORMAL HIGH (ref 70–99)

## 2022-01-26 LAB — CBC WITH DIFFERENTIAL/PLATELET
Abs Immature Granulocytes: 0 10*3/uL (ref 0.00–0.07)
Abs Immature Granulocytes: 0 10*3/uL (ref 0.00–0.07)
Basophils Absolute: 0 10*3/uL (ref 0.0–0.1)
Basophils Absolute: 0 10*3/uL (ref 0.0–0.1)
Basophils Relative: 0 %
Basophils Relative: 0 %
Eosinophils Absolute: 0 10*3/uL (ref 0.0–0.5)
Eosinophils Absolute: 0 10*3/uL (ref 0.0–0.5)
Eosinophils Relative: 0 %
Eosinophils Relative: 0 %
HCT: 18.2 % — ABNORMAL LOW (ref 36.0–46.0)
HCT: 17.4 % — ABNORMAL LOW (ref 36.0–46.0)
Hemoglobin: 5.6 g/dL — CL (ref 12.0–15.0)
Hemoglobin: 5.7 g/dL — CL (ref 12.0–15.0)
Lymphocytes Relative: 38 %
Lymphocytes Relative: 34 %
Lymphs Abs: 0.7 10*3/uL (ref 0.7–4.0)
Lymphs Abs: 0.5 10*3/uL — ABNORMAL LOW (ref 0.7–4.0)
MCH: 32 pg (ref 26.0–34.0)
MCH: 30.3 pg (ref 26.0–34.0)
MCHC: 30.8 g/dL (ref 30.0–36.0)
MCHC: 32.8 g/dL (ref 30.0–36.0)
MCV: 104 fL — ABNORMAL HIGH (ref 80.0–100.0)
MCV: 92.6 fL (ref 80.0–100.0)
Monocytes Absolute: 0 10*3/uL — ABNORMAL LOW (ref 0.1–1.0)
Monocytes Absolute: 0 10*3/uL — ABNORMAL LOW (ref 0.1–1.0)
Monocytes Relative: 2 %
Monocytes Relative: 2 %
Neutro Abs: 1.1 10*3/uL — ABNORMAL LOW (ref 1.7–7.7)
Neutro Abs: 0.9 10*3/uL — ABNORMAL LOW (ref 1.7–7.7)
Neutrophils Relative %: 60 %
Neutrophils Relative %: 64 %
Platelets: <5 10*3/uL — CL (ref 150–400)
Platelets: 14 10*3/uL — CL (ref 150–400)
RBC: 1.75 MIL/uL — ABNORMAL LOW (ref 3.87–5.11)
RBC: 1.88 MIL/uL — ABNORMAL LOW (ref 3.87–5.11)
RDW: 15.9 % — ABNORMAL HIGH (ref 11.5–15.5)
RDW: 19.9 % — ABNORMAL HIGH (ref 11.5–15.5)
Smear Review: DECREASED
WBC: 1.9 10*3/uL — ABNORMAL LOW (ref 4.0–10.5)
WBC: 1.4 10*3/uL — CL (ref 4.0–10.5)
nRBC: 0 % (ref 0.0–0.2)
nRBC: 2.1 % — ABNORMAL HIGH (ref 0.0–0.2)

## 2022-01-26 LAB — I-STAT VENOUS BLOOD GAS, ED
Acid-base deficit: 13 mmol/L — ABNORMAL HIGH (ref 0.0–2.0)
Bicarbonate: 13.2 mmol/L — ABNORMAL LOW (ref 20.0–28.0)
Calcium, Ion: 1.4 mmol/L (ref 1.15–1.40)
HCT: 16 % — ABNORMAL LOW (ref 36.0–46.0)
Hemoglobin: 5.4 g/dL — CL (ref 12.0–15.0)
O2 Saturation: 99 %
Potassium: 5.1 mmol/L (ref 3.5–5.1)
Sodium: 141 mmol/L (ref 135–145)
TCO2: 14 mmol/L — ABNORMAL LOW (ref 22–32)
pCO2, Ven: 30.2 mmHg — ABNORMAL LOW (ref 44–60)
pH, Ven: 7.248 — ABNORMAL LOW (ref 7.25–7.43)
pO2, Ven: 186 mmHg — ABNORMAL HIGH (ref 32–45)

## 2022-01-26 LAB — RESPIRATORY PANEL BY PCR

## 2022-01-26 LAB — RETICULOCYTES
Immature Retic Fract: 0 % — ABNORMAL LOW (ref 2.3–15.9)
RBC.: 1.69 MIL/uL — ABNORMAL LOW (ref 3.87–5.11)
Retic Ct Pct: <0.4 % — ABNORMAL LOW (ref 0.4–3.1)

## 2022-01-26 LAB — URIC ACID: Uric Acid, Serum: 12.7 mg/dL — ABNORMAL HIGH (ref 2.5–7.1)

## 2022-01-26 LAB — RESP PANEL BY RT-PCR (FLU A&B, COVID) ARPGX2
Influenza A by PCR: NEGATIVE
Influenza B by PCR: NEGATIVE
SARS Coronavirus 2 by RT PCR: NEGATIVE

## 2022-01-26 LAB — LIPASE, BLOOD: Lipase: 36 U/L (ref 11–51)

## 2022-01-26 LAB — FERRITIN: Ferritin: 2521 ng/mL — ABNORMAL HIGH (ref 11–307)

## 2022-01-26 LAB — MRSA NEXT GEN BY PCR, NASAL: MRSA by PCR Next Gen: NOT DETECTED

## 2022-01-26 LAB — BRAIN NATRIURETIC PEPTIDE: B Natriuretic Peptide: 84.2 pg/mL (ref 0.0–100.0)

## 2022-01-26 LAB — I-STAT CHEM 8, ED
BUN: 63 mg/dL — ABNORMAL HIGH (ref 8–23)
Calcium, Ion: 1.37 mmol/L (ref 1.15–1.40)
Chloride: 114 mmol/L — ABNORMAL HIGH (ref 98–111)
Creatinine, Ser: 3.1 mg/dL — ABNORMAL HIGH (ref 0.44–1.00)
Glucose, Bld: 227 mg/dL — ABNORMAL HIGH (ref 70–99)
HCT: 19 % — ABNORMAL LOW (ref 36.0–46.0)
Hemoglobin: 6.5 g/dL — CL (ref 12.0–15.0)
Potassium: 5.1 mmol/L (ref 3.5–5.1)
Sodium: 142 mmol/L (ref 135–145)
TCO2: 15 mmol/L — ABNORMAL LOW (ref 22–32)

## 2022-01-26 LAB — BASIC METABOLIC PANEL
Anion gap: 11 (ref 5–15)
BUN: 67 mg/dL — ABNORMAL HIGH (ref 8–23)
CO2: 17 mmol/L — ABNORMAL LOW (ref 22–32)
Calcium: 9.7 mg/dL (ref 8.9–10.3)
Chloride: 115 mmol/L — ABNORMAL HIGH (ref 98–111)
Creatinine, Ser: 3.01 mg/dL — ABNORMAL HIGH (ref 0.44–1.00)
GFR, Estimated: 16 mL/min — ABNORMAL LOW (ref >60)
Glucose, Bld: 197 mg/dL — ABNORMAL HIGH (ref 70–99)
Potassium: 4.2 mmol/L (ref 3.5–5.1)
Sodium: 143 mmol/L (ref 135–145)

## 2022-01-26 LAB — FOLATE: Folate: 3.8 ng/mL — ABNORMAL LOW (ref >5.9)

## 2022-01-26 LAB — HEMOGLOBIN AND HEMATOCRIT, BLOOD
HCT: 22.3 % — ABNORMAL LOW (ref 36.0–46.0)
HCT: 20 % — ABNORMAL LOW (ref 36.0–46.0)
Hemoglobin: 7.6 g/dL — ABNORMAL LOW (ref 12.0–15.0)
Hemoglobin: 6.7 g/dL — CL (ref 12.0–15.0)

## 2022-01-26 LAB — PREPARE RBC (CROSSMATCH)

## 2022-01-26 LAB — TECHNOLOGIST SMEAR REVIEW: Plt Morphology: DECREASED

## 2022-01-26 LAB — DIC (DISSEMINATED INTRAVASCULAR COAGULATION)PANEL
D-Dimer, Quant: 6.67 ug/mL-FEU — ABNORMAL HIGH (ref 0.00–0.50)
Fibrinogen: 652 mg/dL — ABNORMAL HIGH (ref 210–475)
INR: 1.5 — ABNORMAL HIGH (ref 0.8–1.2)
Platelets: <5 10*3/uL — CL (ref 150–400)
Prothrombin Time: 18.3 seconds — ABNORMAL HIGH (ref 11.4–15.2)
Smear Review: NONE SEEN
aPTT: 27 seconds (ref 24–36)

## 2022-01-26 LAB — PHOSPHORUS
Phosphorus: 5.6 mg/dL — ABNORMAL HIGH (ref 2.5–4.6)
Phosphorus: 5.8 mg/dL — ABNORMAL HIGH (ref 2.5–4.6)

## 2022-01-26 LAB — HEMOGLOBIN A1C
Hgb A1c MFr Bld: 4.6 % — ABNORMAL LOW (ref 4.8–5.6)
Mean Plasma Glucose: 85.32 mg/dL

## 2022-01-26 LAB — LACTATE DEHYDROGENASE: LDH: 260 U/L — ABNORMAL HIGH (ref 98–192)

## 2022-01-26 LAB — LACTIC ACID, PLASMA
Lactic Acid, Venous: 1.5 mmol/L (ref 0.5–1.9)
Lactic Acid, Venous: 7.1 mmol/L (ref 0.5–1.9)

## 2022-01-26 LAB — TROPONIN I (HIGH SENSITIVITY)
Troponin I (High Sensitivity): 18 ng/L — ABNORMAL HIGH (ref <18)
Troponin I (High Sensitivity): 16 ng/L (ref <18)

## 2022-01-26 LAB — COMPREHENSIVE METABOLIC PANEL
ALT: 35 U/L (ref 0–44)
AST: 48 U/L — ABNORMAL HIGH (ref 15–41)
Albumin: 2.4 g/dL — ABNORMAL LOW (ref 3.5–5.0)
Alkaline Phosphatase: 71 U/L (ref 38–126)
Anion gap: 16 — ABNORMAL HIGH (ref 5–15)
BUN: 66 mg/dL — ABNORMAL HIGH (ref 8–23)
CO2: 14 mmol/L — ABNORMAL LOW (ref 22–32)
Calcium: 10.4 mg/dL — ABNORMAL HIGH (ref 8.9–10.3)
Chloride: 113 mmol/L — ABNORMAL HIGH (ref 98–111)
Creatinine, Ser: 3.24 mg/dL — ABNORMAL HIGH (ref 0.44–1.00)
GFR, Estimated: 15 mL/min — ABNORMAL LOW (ref >60)
Glucose, Bld: 235 mg/dL — ABNORMAL HIGH (ref 70–99)
Potassium: 4.7 mmol/L (ref 3.5–5.1)
Sodium: 143 mmol/L (ref 135–145)
Total Bilirubin: 1.8 mg/dL — ABNORMAL HIGH (ref 0.3–1.2)
Total Protein: 6 g/dL — ABNORMAL LOW (ref 6.5–8.1)

## 2022-01-26 LAB — CBG MONITORING, ED
Glucose-Capillary: 170 mg/dL — ABNORMAL HIGH (ref 70–99)
Glucose-Capillary: 119 mg/dL — ABNORMAL HIGH (ref 70–99)

## 2022-01-26 LAB — VITAMIN B12: Vitamin B-12: 725 pg/mL (ref 180–914)

## 2022-01-26 LAB — TSH: TSH: 0.018 u[IU]/mL — ABNORMAL LOW (ref 0.350–4.500)

## 2022-01-26 LAB — MAGNESIUM
Magnesium: 2.5 mg/dL — ABNORMAL HIGH (ref 1.7–2.4)
Magnesium: 2.4 mg/dL (ref 1.7–2.4)

## 2022-01-26 MED ORDER — SODIUM CHLORIDE 0.9% IV SOLUTION: Freq: Once | INTRAVENOUS | Status: AC

## 2022-01-26 MED ORDER — CHLORHEXIDINE GLUCONATE CLOTH 2 % EX PADS
6.0000 | MEDICATED_PAD | Freq: Every day | CUTANEOUS | Status: DC
  Administered 2022-01-26 – 2022-01-29 (×4): 6 via TOPICAL

## 2022-01-26 MED ORDER — MIDAZOLAM HCL 2 MG/2ML IJ SOLN
INTRAMUSCULAR | Status: AC | PRN
  Administered 2022-01-26 (×2): .5 mg via INTRAVENOUS

## 2022-01-26 MED ORDER — POLYETHYLENE GLYCOL 3350 17 G PO PACK: 17.0000 g | PACK | Freq: Every day | ORAL | Status: DC | PRN

## 2022-01-26 MED ORDER — MIDAZOLAM HCL 2 MG/2ML IJ SOLN
INTRAMUSCULAR | Status: AC
  Filled 2022-01-26: qty 2

## 2022-01-26 MED ORDER — ORAL CARE MOUTH RINSE: 15.0000 mL | OROMUCOSAL | Status: DC | PRN

## 2022-01-26 MED ORDER — LIDOCAINE HCL (PF) 1 % IJ SOLN
5.0000 mL | Freq: Once | INTRAMUSCULAR | Status: DC
  Filled 2022-01-26: qty 5

## 2022-01-26 MED ORDER — INSULIN ASPART 100 UNIT/ML IJ SOLN
0.0000 [IU] | INTRAMUSCULAR | Status: DC
  Administered 2022-01-26 (×2): 1 [IU] via SUBCUTANEOUS
  Administered 2022-01-26: 2 [IU] via SUBCUTANEOUS
  Administered 2022-01-27: 1 [IU] via SUBCUTANEOUS
  Administered 2022-01-27: 2 [IU] via SUBCUTANEOUS
  Administered 2022-01-27: 1 [IU] via SUBCUTANEOUS
  Administered 2022-01-27 (×2): 2 [IU] via SUBCUTANEOUS
  Administered 2022-01-27 – 2022-01-28 (×5): 1 [IU] via SUBCUTANEOUS
  Administered 2022-01-28: 2 [IU] via SUBCUTANEOUS
  Administered 2022-01-29: 1 [IU] via SUBCUTANEOUS
  Administered 2022-01-29: 3 [IU] via SUBCUTANEOUS
  Administered 2022-01-29 (×3): 2 [IU] via SUBCUTANEOUS

## 2022-01-26 MED ORDER — DOCUSATE SODIUM 100 MG PO CAPS: 100.0000 mg | ORAL_CAPSULE | Freq: Two times a day (BID) | ORAL | Status: DC | PRN

## 2022-01-26 MED ORDER — SODIUM CHLORIDE 0.9 % IV SOLN
10.0000 mL/h | Freq: Once | INTRAVENOUS | Status: AC
  Administered 2022-01-26: 10 mL/h via INTRAVENOUS

## 2022-01-26 MED ORDER — PIPERACILLIN-TAZOBACTAM 3.375 G IVPB 30 MIN
3.3750 g | Freq: Once | INTRAVENOUS | Status: AC
  Administered 2022-01-26: 3.375 g via INTRAVENOUS
  Filled 2022-01-26: qty 50

## 2022-01-26 MED ORDER — LACTATED RINGERS IV SOLN: INTRAVENOUS | Status: AC

## 2022-01-26 MED ORDER — MAGIC MOUTHWASH
5.0000 mL | Freq: Four times a day (QID) | ORAL | Status: DC | PRN
  Administered 2022-01-28 (×2): 5 mL via ORAL
  Filled 2022-01-26 (×4): qty 5

## 2022-01-26 MED ORDER — LACTATED RINGERS IV BOLUS
1000.0000 mL | Freq: Once | INTRAVENOUS | Status: AC
  Administered 2022-01-26: 1000 mL via INTRAVENOUS

## 2022-01-26 MED ORDER — FENTANYL CITRATE (PF) 100 MCG/2ML IJ SOLN
INTRAMUSCULAR | Status: AC
  Filled 2022-01-26: qty 2

## 2022-01-26 MED ORDER — FENTANYL CITRATE (PF) 100 MCG/2ML IJ SOLN
INTRAMUSCULAR | Status: AC | PRN
  Administered 2022-01-26: 25 ug via INTRAVENOUS

## 2022-01-26 NOTE — ED Provider Notes (Signed)
[facility] EMERGENCY DEPARTMENT Provider Note   CSN: [id number] Arrival date & time: 01/12/2022  2255     History  Chief Complaint  Patient presents with   Chest Pain   Shortness of Breath    Katrina Hughes is a 71 y.o. female.  The history is provided by the patient, a relative and medical records.  Chest Pain Associated symptoms: shortness of breath   Shortness of Breath Associated symptoms: chest pain   Katrina Hughes is a 71 y.o. female who presents to the Emergency Department complaining of chest pain.  She presents to the ED for evaluation of central chest pain, shortness of breath that started on Sunday.  No associated fevers.  She does report diarrhea with 3 loose bowel movements daily.  No abdominal pain, fevers.  She did recently travel to [place] to visit family members.  No prior similar symptoms.  She has a history of hypertension, diabetes, CKD.  She has a history of multiple prior surgeries.  No known sick contacts.     Home Medications Prior to Admission medications   Medication Sig Start Date End Date Taking? Authorizing Provider  acetaminophen (TYLENOL) 325 MG tablet Take 650-1,300 mg by mouth See admin instructions. Take 1300 mg by mouth twice daily. Take 650 mg by mouth twice daily as needed for pain or headache.    [provider]  albuterol (PROVENTIL HFA;VENTOLIN HFA) 108 (90 Base) MCG/ACT inhaler Inhale 2 puffs into the lungs every 6 (six) hours as needed for wheezing or shortness of breath.    [provider]  allopurinol (ZYLOPRIM) 100 MG tablet Take 100 mg by mouth 2 (two) times daily.     [provider]  amoxicillin-clavulanate (AUGMENTIN) 500-125 MG tablet Take 1 tablet (500 mg total) by mouth 2 (two) times daily. 12/15/21   Raspet, Derry Skill, PA-C  atorvastatin (LIPITOR) 80 MG tablet Take 40 mg by mouth at bedtime.    [provider]  brimonidine (ALPHAGAN) 0.2 % ophthalmic solution Place 1  drop into both eyes 2 (two) times a day.    [provider]  budesonide-formoterol (SYMBICORT) 160-4.5 MCG/ACT inhaler Inhale 2 puffs into the lungs 2 (two) times a day.     [provider]  carvedilol (COREG) 12.5 MG tablet Take 12.5 mg by mouth 2 (two) times daily with a meal.     [provider]  diclofenac sodium (VOLTAREN) 1 % GEL Apply 1 application topically 2 (two) times a day.    [provider]  Difluprednate 0.05 % EMUL Place 1 drop into the left eye 4 (four) times daily.    [provider]  enoxaparin (LOVENOX) 40 MG/0.4ML injection Inject 0.4 mLs (40 mg total) into the skin daily for 14 doses. 11/04/18 11/18/18  Reche Dixon, PA-C  furosemide (LASIX) 40 MG tablet Take 40 mg by mouth 2 (two) times daily.     [provider]  insulin aspart (NOVOLOG) 100 UNIT/ML injection Inject 3-10 Units into the skin 3 (three) times daily with meals.     [provider]  insulin glargine (LANTUS) 100 UNIT/ML injection Inject 38 Units into the skin at bedtime.     [provider]  lactase (LACTAID) 3000 units tablet Take 3,000 Units by mouth 3 (three) times daily with meals. Patient not taking: Reported on 03/10/2020    [provider]  lactobacillus acidophilus & bulgar (LACTINEX) chewable tablet Chew 1 tablet by mouth 2 (two) times daily.  [provider]  latanoprost (XALATAN) 0.005 % ophthalmic solution Place 1 drop into both eyes at bedtime.    [provider]  magic mouthwash (nystatin, lidocaine, diphenhydrAMINE) suspension Take 5 mLs by mouth 4 (four) times daily as needed for mouth pain. Swish and spit 12/15/21   Raspet, Junie Panning K, PA-C  methimazole (TAPAZOLE) 5 MG tablet Take 5 mg by mouth daily.    [provider]  nitroGLYCERIN (NITRODUR - DOSED IN MG/24 HR) 0.6 mg/hr patch Place 0.6 mg onto the skin daily. Places at [place], removes at 10p    [provider]  ondansetron (ZOFRAN) 4 MG  tablet Take 1 tablet (4 mg total) by mouth every 6 (six) hours as needed for nausea. 11/04/18   Reche Dixon, PA-C  oxyCODONE (OXY IR/ROXICODONE) 5 MG immediate release tablet Take 1-2 tablets (5-10 mg total) by mouth every 4 (four) hours as needed for moderate pain (pain score 4-6). Patient not taking: Reported on 03/10/2020 11/04/18   Reche Dixon, PA-C  pantoprazole (PROTONIX) 20 MG tablet Take 20 mg by mouth 2 (two) times daily.    [provider]  senna-docusate (SENOKOT-S) 8.6-50 MG tablet Take 1 tablet by mouth 2 (two) times daily.    [provider]  sodium chloride (OCEAN) 0.65 % SOLN nasal spray Place 2 sprays into both nostrils daily as needed for congestion.     [provider]  spironolactone (ALDACTONE) 25 MG tablet Take 25 mg by mouth daily.     [provider]  timolol (BETIMOL) 0.5 % ophthalmic solution Place 1 drop into both eyes daily.     [provider]  topiramate (TOPAMAX) 100 MG tablet Take 100 mg by mouth every evening.    [provider]      Allergies    Elemental sulfur and Aspirin    Review of Systems   Review of Systems  Respiratory:  Positive for shortness of breath.   Cardiovascular:  Positive for chest pain.  All other systems reviewed and are negative.   Physical Exam Updated Vital Signs BP 126/63   Pulse 86   Temp 99.2 F (37.3 C) (Oral)   Resp 20   SpO2 100%  Physical Exam Vitals and nursing note reviewed.  Constitutional:      General: She is in acute distress.     Appearance: She is well-developed. She is ill-appearing and toxic-appearing.  HENT:     Head: Normocephalic and atraumatic.     Comments: Petechiae throughout the buccal mucosa Cardiovascular:     Rate and Rhythm: Regular rhythm. Tachycardia present.     Heart sounds: No murmur heard. Pulmonary:     Effort: Pulmonary effort is normal. No respiratory distress.     Breath sounds: Normal breath sounds.  Abdominal:     Palpations:  Abdomen is soft.     Tenderness: There is abdominal tenderness. There is no guarding or rebound.     Comments: Moderate upper abdominal tenderness  Genitourinary:    Comments: No gross blood on DRE Musculoskeletal:        General: No swelling or tenderness.     Comments: There is ecchymosis to the right foot and ankle without overt erythema or edema.  1+ pedal pulses bilaterally.  2+ femoral pulses bilaterally  Skin:    General: Skin is warm and dry.  Neurological:     Comments: Oriented to person and place.  Slow to answer questions.  Dysarthric speech.  Moves all extremities symmetrically but  weakly.  Psychiatric:        Behavior: Behavior normal.     ED Results / Procedures / Treatments   Labs (all labs ordered are listed, but only abnormal results are displayed) Labs Reviewed  CBC WITH DIFFERENTIAL/PLATELET - Abnormal; Notable for the following components:      Result Value   WBC 1.9 (*)    RBC 1.75 (*)    Hemoglobin 5.6 (*)    HCT 18.2 (*)    MCV 104.0 (*)    RDW 15.9 (*)    Platelets <5 (*)    Neutro Abs 1.1 (*)    Monocytes Absolute 0.0 (*)    All other components within normal limits  COMPREHENSIVE METABOLIC PANEL - Abnormal; Notable for the following components:   Chloride 113 (*)    CO2 14 (*)    Glucose, Bld 235 (*)    BUN 66 (*)    Creatinine, Ser 3.24 (*)    Calcium 10.4 (*)    Total Protein 6.0 (*)    Albumin 2.4 (*)    AST 48 (*)    Total Bilirubin 1.8 (*)    GFR, Estimated 15 (*)    Anion gap 16 (*)    All other components within normal limits  LACTIC ACID, PLASMA - Abnormal; Notable for the following components:   Lactic Acid, Venous 7.1 (*)    All other components within normal limits  DIC (DISSEMINATED INTRAVASCULAR COAGULATION)PANEL - Abnormal; Notable for the following components:   Prothrombin Time 18.3 (*)    INR 1.5 (*)    Fibrinogen 652 (*)    D-Dimer, Quant 6.67 (*)    Platelets <5 (*)    All other components within normal limits   LACTATE DEHYDROGENASE - Abnormal; Notable for the following components:   LDH 260 (*)    All other components within normal limits  URIC ACID - Abnormal; Notable for the following components:   Uric Acid, Serum 12.7 (*)    All other components within normal limits  PHOSPHORUS - Abnormal; Notable for the following components:   Phosphorus 5.6 (*)    All other components within normal limits  MAGNESIUM - Abnormal; Notable for the following components:   Magnesium 2.5 (*)    All other components within normal limits  RETICULOCYTES - Abnormal; Notable for the following components:   Retic Ct Pct <0.4 (*)    RBC. 1.69 (*)    Immature Retic Fract 0.0 (*)    All other components within normal limits  CBC - Abnormal; Notable for the following components:   WBC 1.7 (*)    RBC 1.70 (*)    Hemoglobin 5.3 (*)    HCT 17.6 (*)    MCV 103.5 (*)    RDW 16.1 (*)    Platelets <5 (*)    All other components within normal limits  BASIC METABOLIC PANEL - Abnormal; Notable for the following components:   Chloride 115 (*)    CO2 17 (*)    Glucose, Bld 197 (*)    BUN 67 (*)    Creatinine, Ser 3.01 (*)    GFR, Estimated 16 (*)    All other components within normal limits  PHOSPHORUS - Abnormal; Notable for the following components:   Phosphorus 5.8 (*)    All other components within normal limits  HEMOGLOBIN A1C - Abnormal; Notable for the following components:   Hgb A1c MFr Bld 4.6 (*)    All other components within normal limits  CBC WITH DIFFERENTIAL/PLATELET - Abnormal; Notable for the following components:   WBC 1.4 (*)    RBC 1.88 (*)    Hemoglobin 5.7 (*)    HCT 17.4 (*)    RDW 19.9 (*)    Platelets 14 (*)    nRBC 2.1 (*)    Neutro Abs 0.9 (*)    Lymphs Abs 0.5 (*)    Monocytes Absolute 0.0 (*)    All other components within normal limits  FERRITIN - Abnormal; Notable for the following components:   Ferritin 2,521 (*)    All other components within normal limits  TSH -  Abnormal; Notable for the following components:   TSH 0.018 (*)    All other components within normal limits  I-STAT CHEM 8, ED - Abnormal; Notable for the following components:   Chloride 114 (*)    BUN 63 (*)    Creatinine, Ser 3.10 (*)    Glucose, Bld 227 (*)    TCO2 15 (*)    Hemoglobin 6.5 (*)    HCT 19.0 (*)    All other components within normal limits  CBG MONITORING, ED - Abnormal; Notable for the following components:   Glucose-Capillary 207 (*)    All other components within normal limits  I-STAT VENOUS BLOOD GAS, ED - Abnormal; Notable for the following components:   pH, Ven 7.248 (*)    pCO2, Ven 30.2 (*)    pO2, Ven 186 (*)    Bicarbonate 13.2 (*)    TCO2 14 (*)    Acid-base deficit 13.0 (*)    HCT 16.0 (*)    Hemoglobin 5.4 (*)    All other components within normal limits  CBG MONITORING, ED - Abnormal; Notable for the following components:   Glucose-Capillary 170 (*)    All other components within normal limits  TROPONIN I (HIGH SENSITIVITY) - Abnormal; Notable for the following components:   Troponin I (High Sensitivity) 18 (*)    All other components within normal limits  RESP PANEL BY RT-PCR (FLU A&B, COVID) [id number]  RESPIRATORY PANEL BY PCR  CULTURE, BLOOD (ROUTINE X 2)  CULTURE, BLOOD (ROUTINE X 2)  GASTROINTESTINAL PANEL BY PCR, STOOL (REPLACES STOOL CULTURE)  STOOL CULTURE  BRAIN NATRIURETIC PEPTIDE  TECHNOLOGIST SMEAR REVIEW  LIPASE, BLOOD  MAGNESIUM  LACTIC ACID, PLASMA  HAPTOGLOBIN  PROTIME-INR  ADAMTS13 ACTIVITY  CBC  HEMOGLOBIN AND HEMATOCRIT, BLOOD  HEMOGLOBIN AND HEMATOCRIT, BLOOD  HEMOGLOBIN AND HEMATOCRIT, BLOOD  CBC WITH DIFFERENTIAL/PLATELET  TECHNOLOGIST SMEAR REVIEW  T3  TYPE AND SCREEN  PREPARE RBC (CROSSMATCH)  PREPARE PLATELET PHERESIS  PREPARE PLATELET PHERESIS  TROPONIN I (HIGH SENSITIVITY)    EKG EKG Interpretation  Date/Time:  Thursday January 25 2022 23:24:32 EDT Ventricular Rate:  99 PR Interval:  136 QRS  Duration: 108 QT Interval:  346 QTC Calculation: 444 R Axis:   17 Text Interpretation: Normal sinus rhythm Right bundle branch block Abnormal ECG Confirmed by Quintella Reichert [id number]) on 01/07/2022 11:55:58 PM  Radiology CT CHEST ABDOMEN PELVIS WO CONTRAST  Result Date: 01/26/2022 CLINICAL DATA:  Sepsis. EXAM: CT CHEST, ABDOMEN AND PELVIS WITHOUT CONTRAST TECHNIQUE: Multidetector CT imaging of the chest, abdomen and pelvis was performed following the standard protocol without IV contrast. RADIATION DOSE REDUCTION: This exam was performed according to the departmental dose-optimization program which includes automated exposure control, adjustment of the mA and/or kV according to patient size and/or use of iterative reconstruction technique. COMPARISON:  Chest radiograph dated 01/24/2022. FINDINGS: Evaluation of this  exam is limited in the absence of intravenous contrast. CT CHEST FINDINGS Cardiovascular: There is no cardiomegaly or pericardial effusion. There is coronary vascular calcification. Mild atherosclerotic calcification of the thoracic aorta. No aneurysmal dilatation. The central pulmonary arteries are grossly unremarkable. Mediastinum/Nodes: No hilar or mediastinal adenopathy. The esophagus is grossly unremarkable. Lobulated appearance of the thyroid gland with rim calcified inferior isthmic nodule measuring up to 2 cm. Recommend thyroid US (ref: J Am Coll Radiol. 2015 Feb;12(2): 143-50).No mediastinal fluid collection. Lungs/Pleura: Bibasilar subpleural atelectasis and areas of linear scarring. No focal consolidation, pleural effusion, or pneumothorax. The central airways are patent. Musculoskeletal: Bilateral shoulder arthroplasties. No acute osseous pathology. CT ABDOMEN PELVIS FINDINGS No intra-abdominal free air or free fluid. Hepatobiliary: The liver is grossly unremarkable. There is sludge within the gallbladder. No pericholecystic fluid. Pancreas: Unremarkable. No pancreatic ductal  dilatation or surrounding inflammatory changes. Spleen: Normal in size without focal abnormality. Adrenals/Urinary Tract: Bilateral adrenal thickening or hyperplasia. There is no hydronephrosis or nephrolithiasis on either side. Small bilateral renal cysts and additional hypodense lesions which are too small to characterize. This can be better evaluated with ultrasound on a nonemergent/outpatient basis. The visualized ureters and urinary bladder appear unremarkable. Stomach/Bowel: There is sigmoid diverticulosis without active inflammatory changes. There is no bowel obstruction or active inflammation. The appendix is not visualized with certainty. No inflammatory changes identified in the right lower quadrant. Vascular/Lymphatic: Mild aortoiliac atherosclerotic disease. The IVC is unremarkable. No portal venous gas. There is no adenopathy. Reproductive: Hysterectomy.  No adnexal masses. Other: Ventral hernia repair with mesh. Multiple scattered surgical clips in the abdomen. Musculoskeletal: Degenerative changes of the spine. No acute osseous pathology. IMPRESSION: 1. No acute intrathoracic, abdominal, or pelvic pathology. 2. Sigmoid diverticulosis. No bowel obstruction. 3. Small bilateral renal cysts and additional hypodense lesions which are too small to characterize. 4.  Aortic Atherosclerosis (ICD10-I70.0). Electronically Signed   By: Anner Crete M.D.   On: 01/26/2022 01:40   CT Head Wo Contrast  Result Date: 01/26/2022 CLINICAL DATA:  Altered mental status. EXAM: CT HEAD WITHOUT CONTRAST TECHNIQUE: Contiguous axial images were obtained from the base of the skull through the vertex without intravenous contrast. RADIATION DOSE REDUCTION: This exam was performed according to the departmental dose-optimization program which includes automated exposure control, adjustment of the mA and/or kV according to patient size and/or use of iterative reconstruction technique. COMPARISON:  None Available. FINDINGS:  Brain: Mild age-related atrophy and chronic microvascular ischemic changes. Small scattered periventricular and deep white matter hypodense foci may represent chronic microvascular ischemic changes, old lacunar infarcts, or white matter disease. There is no acute intracranial hemorrhage. No mass effect or midline shift. No extra-axial fluid collection. Vascular: No hyperdense vessel or unexpected calcification. Skull: Normal. Negative for fracture or focal lesion. Sinuses/Orbits: No acute finding. Other: None IMPRESSION: 1. No acute intracranial pathology. 2. Mild age-related atrophy and chronic microvascular ischemic changes. Electronically Signed   By: Anner Crete M.D.   On: 01/26/2022 01:08   DG CHEST PORT 1 VIEW  Result Date: 01/31/2022 CLINICAL DATA:  Shortness of breath. EXAM: PORTABLE CHEST 1 VIEW COMPARISON:  Chest radiograph dated 09/17/2017. FINDINGS: No focal consolidation, pleural effusion, pneumothorax. The cardiac silhouette is within normal limits. No acute osseous pathology. Bilateral shoulder arthroplasty. IMPRESSION: No active disease. Electronically Signed   By: Anner Crete M.D.   On: 01/11/2022 23:48    Procedures Procedures   CRITICAL CARE Performed by: Quintella Reichert   Total critical care time: 65 minutes  Critical  care time was exclusive of separately billable procedures and treating other patients.  Critical care was necessary to treat or prevent imminent or life-threatening deterioration.  Critical care was time spent personally by me on the following activities: development of treatment plan with patient and/or surrogate as well as nursing, discussions with consultants, evaluation of patient's response to treatment, examination of patient, obtaining history from patient or surrogate, ordering and performing treatments and interventions, ordering and review of laboratory studies, ordering and review of radiographic studies, pulse oximetry and re-evaluation of  patient's condition.  Medications Ordered in ED Medications  lactated ringers infusion (0 mLs Intravenous Paused 01/26/22 0711)  docusate sodium (COLACE) capsule 100 mg (has no administration in time range)  polyethylene glycol (MIRALAX / GLYCOLAX) packet 17 g (has no administration in time range)  insulin aspart (novoLOG) injection 0-9 Units (2 Units Subcutaneous Given 01/26/22 0349)  sodium chloride 0.9 % bolus 1,000 mL (0 mLs Intravenous Stopped 01/26/22 0300)  0.9 %  sodium chloride infusion (0 mL/hr Intravenous Stopped 01/26/22 0444)  piperacillin-tazobactam (ZOSYN) IVPB 3.375 g (0 g Intravenous Stopped 01/26/22 0221)  0.9 %  sodium chloride infusion (Manually program via Guardrails IV Fluids) (0 mLs Intravenous Stopped 01/26/22 0230)  lactated ringers bolus 1,000 mL (0 mLs Intravenous Stopped 01/26/22 0621)  0.9 %  sodium chloride infusion (Manually program via Guardrails IV Fluids) ( Intravenous New Bag/Given 01/26/22 0737)    ED Course/ Medical Decision Making/ A&P Clinical Course as of 01/26/22 0751  Fri Jan 26, 2022  0039 DIC Panel Once [JN]    Clinical Course User Index [JN] Nogle, [place], MD                           Medical Decision Making Amount and/or Complexity of Data Reviewed Labs: ordered. Radiology: ordered.  Risk Prescription drug management. Decision regarding hospitalization.   Patient with history of CKD, diabetes, heart disease here for evaluation of chest pain and shortness of breath since Sunday.  She is ill-appearing on evaluation with tachycardia, transient hypotension that responded to IV fluid bolus.  Labs significant for AKI with new anemia with hemoglobin of 5.6, platelets less than 5000, leukopenic.  Patient consented and treated with emergent blood transfusion for symptomatic anemia as well as platelet transfusion for her profound thrombocytopenia.  CT head was obtained given altered mental status and thrombocytopenia-negative for acute  abnormality.  CT chest abdomen pelvis obtained given abdominal tenderness-no acute abnormality.  She was treated empirically with antibiotics for possible sepsis given her leukopenia.  Patient's blood pressure did improve with IV fluid administration as well as blood products.  Critical care consulted for admission and ongoing work-up and treatment.  Patient and son updated on findings of studies and critical nature of illness and they are in agreement with plan.        Final Clinical Impression(s) / ED Diagnoses Final diagnoses:  Symptomatic anemia  Thrombocytopenia ([place])  AKI (acute kidney injury) [facility])    Rx / [place] Orders ED Discharge Orders     None         Quintella Reichert, MD 01/26/22 [phone]

## 2022-01-26 NOTE — Progress Notes (Signed)
PCCM Interval Progress Note  Repeat CBC noteable for Hgb 5.7 and PLT 14.  It appears only 1u PRBC was transfused thus far along with 1u PLT. Pre transfusion, Hgb was 5.3 and PLT < 5.  Have asked RN to go ahead and start 2nd unit PRBC now and have ordered an additional 1u PLT in anticipation of IR bone marrow biopsy this morning.   Will sign out to oncoming day team to follow up on rounds this AM.   Montey Hora, [place] For pager details, please see AMION or use Epic chat  After 1900, please call [facility] for cross coverage needs 01/26/2022, 6:40 AM

## 2022-01-26 NOTE — H&P (Signed)
NAME:  Katrina Hughes, MRN:  [id number], DOB:  11/30/1950, LOS: 0 ADMISSION DATE:  02/05/2022, CONSULTATION DATE:  01/26/22 REFERRING MD:  Ralene Bathe CHIEF COMPLAINT:  Chest pain, SOB   History of Present Illness:  Katrina Hughes is a 71 y.o. female who has a PMH as below including but not limited to HTN, CHF, DM. She presented to [facility] ED 10/19 with chest pain and dyspnea that began 4 days prior and persisted. History is a bit vague as her son in law at bedside states that 4 days ago (Sunday) she had only complained of "not feeling well" but denied any chest pain or dyspnea. She had just returned from a trip to [place] visiting family so they thought she might have had a cold.  10/19, she had central chest pain. Non-radiating with no diaphoresis, lightheadedness, arm tingling. Had some dyspnea and cough productive of clear sputum. Denied any nausea and vomiting but did endorse diarrhea with 2 - 3 loose stools per day for past few days. Diarrhea was non bloody. Sister noticed that pt's gums were bloody on 10/19 during a facetime call and this along with ongoing not feeling well was what prompted ED evaluation. Pt states bloody gums and lips started that same day but was mild. Denies any additional bleeding. Step son did state that pt's thighs were bruised roughly 1 month ago but it did not worsen and they didn't think much of it.  Blood work returned with CBC noteable for WBC 1.7, Hgb 5.3, PLT <5. D-dimer 6.67, Fibrinogen 652. Peripheral smear was negative for schistocytes. CT head and CT chest/abd/pelv were negative for acute process.  2u PRBC and 1u platelets were ordered by EDP and are currently transfusing.  Pertinent  Medical History:  has Status post shoulder replacement; S/p reverse total shoulder arthroplasty; and Anemia on their problem list.  Significant Hospital Events: Including procedures, antibiotic start and stop dates in addition to other pertinent events   10/20 admit.  Interim History /  Subjective:  Getting 1 of 2u PRBC and 1u PLT now. Step son at bedside. Has blood tinged lips but no active bleeding/oozing.  Objective:  Blood pressure 105/66, pulse 95, temperature 98.3 F (36.8 C), temperature source Oral, resp. rate (!) 22, SpO2 99 %.       No intake or output data in the 24 hours ending 01/26/22 0151 There were no vitals filed for this visit.  Examination: General: Adult female, critically ill. Neuro: Awake, answers questions, MAE's. HEENT: [place]/AT. Sclerae anicteric. EOMI. Bloody gums/lips. Cardiovascular: RRR, no M/R/G.  Lungs: Respirations even and unlabored.  CTA bilaterally, No W/R/R. Abdomen: BS x 4, soft, NT/ND.  Musculoskeletal: No gross deformities, no edema.  Skin: Intact, warm, no rashes.  Labs/imaging personally reviewed:  CT head 10/20 > neg. CT chest/abd/pelv > sigmoid diverticulosis.  Assessment & Plan:   Pancytopenia of unclear etiology - peripheral smear negative for schistocytes which is reassuring and makes TTP less likely. ADAMTS13 pending. Discussed with hematology who is concerned for possible acute leukemia. - Continue supportive care and PRBC transfusion. - Maintain Hgb > 7. - 1u PLT already ordered, continue then hold on further unless has bleeding. - F/u on ADAMTS13. - Repeat peripheral smear in AM> - Needs bone marrow biopsy, this has been ordered. - Discussed with Dr. Lindi Adie of hematology who will evaluate in AM. No additional recommendations for tonight.  Chest pain - central, non-radiating. Seems intermittent but difficult to ascertain given poor history. She did take 1 aspirin a  few days PTA. Initial EKG normal. - F/u on troponin. - Continue supportive care and PRBC transfusion as above.  AoCKD. - Supportive care. - Follow BMP.  Hx HTN, MI, CHF. - See below regarding home med rec.  Hx DM - Per son, she has been off of insulin for a while now. - SSI if glucose consistently > 180.  Hx Hyperthyroidism. - Check TSH,  T3.  ? Home medication accuracy - per son, some of her meds listed on MAR are not active. - Will ask pharmacy to assist with med rec, hold on reordering any home meds for now.   Best practice (evaluated daily):  Diet/type: NPO DVT prophylaxis: not indicated GI prophylaxis: N/A Lines: N/A Foley:  N/A Code Status:  full code Last date of multidisciplinary goals of care discussion: Step son updated at bedside.  Labs   CBC: Recent Labs  Lab 02/05/2022 2319 01/26/22 0000 01/26/22 0051 01/26/22 0052 01/26/22 0107  WBC 1.9*  --   --   --  1.7*  NEUTROABS 1.1*  --   --   --   --   HGB 5.6*  --  6.5* 5.4* 5.3*  HCT 18.2*  --  19.0* 16.0* 17.6*  MCV 104.0*  --   --   --  103.5*  PLT <5* <5*  --   --  <5*    Basic Metabolic Panel: Recent Labs  Lab 01/15/2022 2319 01/26/22 0051 01/26/22 0052  NA 143 142 141  K 4.7 5.1 5.1  CL 113* 114*  --   CO2 14*  --   --   GLUCOSE 235* 227*  --   BUN 66* 63*  --   CREATININE 3.24* 3.10*  --   CALCIUM 10.4*  --   --    GFR: CrCl cannot be calculated (Unknown ideal weight.). Recent Labs  Lab 01/26/2022 2319 01/26/22 0015 01/26/22 0107  WBC 1.9*  --  1.7*  LATICACIDVEN  --  7.1*  --     Liver Function Tests: Recent Labs  Lab 01/27/2022 2319  AST 48*  ALT 35  ALKPHOS 71  BILITOT 1.8*  PROT 6.0*  ALBUMIN 2.4*   No results for input(s): "LIPASE", "AMYLASE" in the last 168 hours. No results for input(s): "AMMONIA" in the last 168 hours.  ABG    Component Value Date/Time   HCO3 13.2 (L) 01/26/2022 0052   TCO2 14 (L) 01/26/2022 0052   ACIDBASEDEF 13.0 (H) 01/26/2022 0052   O2SAT 99 01/26/2022 0052     Coagulation Profile: Recent Labs  Lab 01/26/22 0000  INR 1.5*    Cardiac Enzymes: No results for input(s): "CKTOTAL", "CKMB", "CKMBINDEX", "TROPONINI" in the last 168 hours.  HbA1C: Hgb A1c MFr Bld  Date/Time Value Ref Range Status  11/03/2018 03:40 PM 7.6 (H) 4.8 - 5.6 % Final    Comment:    (NOTE) Pre diabetes:           5.7%-6.4% Diabetes:              >6.4% Glycemic control for   <7.0% adults with diabetes     CBG: Recent Labs  Lab 01/19/2022 2353  GLUCAP 207*    Review of Systems:   All negative; except for those that are bolded, which indicate positives.  Constitutional: weight loss, weight gain, night sweats, fevers, chills, fatigue, weakness.  HEENT: headaches, sore throat, sneezing, nasal congestion, post nasal drip, difficulty swallowing, tooth/dental problems, visual complaints, visual changes, ear aches. Neuro: difficulty with speech,  weakness, numbness, ataxia. CV:  chest pain, orthopnea, PND, swelling in lower extremities, dizziness, palpitations, syncope.  Resp: cough, hemoptysis, dyspnea, wheezing. GI: heartburn, indigestion, abdominal pain, nausea, vomiting, diarrhea, constipation, change in bowel habits, loss of appetite, hematemesis, melena, hematochezia.  GU: dysuria, change in color of urine, urgency or frequency, flank pain, hematuria. MSK: joint pain or swelling, decreased range of motion. Psych: change in mood or affect, depression, anxiety, suicidal ideations, homicidal ideations. Skin: rash, itching, bruising.   Past Medical History:  She,  has a past medical history of Anginal pain ([place]), Bell palsy, CHF (congestive heart failure) ([place]), Chronic kidney disease, Complication of anesthesia, Diabetes mellitus without complication ([place]), Dyspnea, Environmental and seasonal allergies, GERD (gastroesophageal reflux disease), Glaucoma, Hypertension, Hyperthyroidism, Myocardial infarction ([place]), and Sleep apnea.   Surgical History:   Past Surgical History:  Procedure Laterality Date   ABDOMINAL HYSTERECTOMY     APPENDECTOMY     BICEPT TENODESIS Left 06/10/2017   Procedure: BICEPS TENODESIS;  Surgeon: Leim Fabry, MD;  Location: [facility];  Service: Orthopedics;  Laterality: Left;   BREAST REDUCTION SURGERY     CATARACT EXTRACTION, BILATERAL Bilateral    CHOLECYSTECTOMY      CORONARY ANGIOPLASTY WITH STENT PLACEMENT     HERNIA REPAIR     NASAL HEMORRHAGE CONTROL N/A 09/17/2017   Procedure: EPISTAXIS CONTROL;  Surgeon: [name], MD;  Location: [facility];  Service: ENT;  Laterality: N/A;   REVERSE SHOULDER ARTHROPLASTY Left 06/10/2017   Procedure: REVERSE SHOULDER ARTHROPLASTY;  Surgeon: Leim Fabry, MD;  Location: [facility];  Service: Orthopedics;  Laterality: Left;   REVERSE SHOULDER ARTHROPLASTY Right 11/03/2018   Procedure: REVERSE SHOULDER ARTHROPLASTY, BICEPS TENODESIS;  Surgeon: Leim Fabry, MD;  Location: [facility];  Service: Orthopedics;  Laterality: Right;     Social History:   reports that she quit smoking about 43 years ago. She has never used smokeless tobacco. She reports current alcohol use of about 1.0 standard drink of alcohol per week. She reports that she does not currently use drugs after having used the following drugs: Marijuana.   Family History:  Her family history is not on file.   Allergies Allergies  Allergen Reactions   Elemental Sulfur Other (See Comments)    Sulfur based products Skin and stomach irritation   Aspirin Other (See Comments)    Ulcers in stomach     Home Medications  Prior to Admission medications   Medication Sig Start Date End Date Taking? Authorizing Provider  acetaminophen (TYLENOL) 325 MG tablet Take 650-1,300 mg by mouth See admin instructions. Take 1300 mg by mouth twice daily. Take 650 mg by mouth twice daily as needed for pain or headache.    [provider]  albuterol (PROVENTIL HFA;VENTOLIN HFA) 108 (90 Base) MCG/ACT inhaler Inhale 2 puffs into the lungs every 6 (six) hours as needed for wheezing or shortness of breath.    [provider]  allopurinol (ZYLOPRIM) 100 MG tablet Take 100 mg by mouth 2 (two) times daily.     [provider]  amoxicillin-clavulanate (AUGMENTIN) 500-125 MG tablet Take 1 tablet (500 mg total) by mouth 2 (two) times daily. 12/15/21   Raspet, Derry Skill,  PA-C  atorvastatin (LIPITOR) 80 MG tablet Take 40 mg by mouth at bedtime.    [provider]  brimonidine (ALPHAGAN) 0.2 % ophthalmic solution Place 1 drop into both eyes 2 (two) times a day.    [provider]  budesonide-formoterol (SYMBICORT) 160-4.5 MCG/ACT inhaler Inhale 2 puffs  into the lungs 2 (two) times a day.     [provider]  carvedilol (COREG) 12.5 MG tablet Take 12.5 mg by mouth 2 (two) times daily with a meal.     [provider]  diclofenac sodium (VOLTAREN) 1 % GEL Apply 1 application topically 2 (two) times a day.    [provider]  Difluprednate 0.05 % EMUL Place 1 drop into the left eye 4 (four) times daily.    [provider]  enoxaparin (LOVENOX) 40 MG/0.4ML injection Inject 0.4 mLs (40 mg total) into the skin daily for 14 doses. 11/04/18 11/18/18  Reche Dixon, PA-C  furosemide (LASIX) 40 MG tablet Take 40 mg by mouth 2 (two) times daily.     [provider]  insulin aspart (NOVOLOG) 100 UNIT/ML injection Inject 3-10 Units into the skin 3 (three) times daily with meals.     [provider]  insulin glargine (LANTUS) 100 UNIT/ML injection Inject 38 Units into the skin at bedtime.     [provider]  lactase (LACTAID) 3000 units tablet Take 3,000 Units by mouth 3 (three) times daily with meals. Patient not taking: Reported on 03/10/2020    [provider]  lactobacillus acidophilus & bulgar (LACTINEX) chewable tablet Chew 1 tablet by mouth 2 (two) times daily.    [provider]  latanoprost (XALATAN) 0.005 % ophthalmic solution Place 1 drop into both eyes at bedtime.    [provider]  magic mouthwash (nystatin, lidocaine, diphenhydrAMINE) suspension Take 5 mLs by mouth 4 (four) times daily as needed for mouth pain. Swish and spit 12/15/21   Raspet, Junie Panning K, PA-C  methimazole (TAPAZOLE) 5 MG tablet Take 5 mg by mouth daily.    [provider]  nitroGLYCERIN  (NITRODUR - DOSED IN MG/24 HR) 0.6 mg/hr patch Place 0.6 mg onto the skin daily. Places at [place], removes at 10p    [provider]  ondansetron (ZOFRAN) 4 MG tablet Take 1 tablet (4 mg total) by mouth every 6 (six) hours as needed for nausea. 11/04/18   Reche Dixon, PA-C  oxyCODONE (OXY IR/ROXICODONE) 5 MG immediate release tablet Take 1-2 tablets (5-10 mg total) by mouth every 4 (four) hours as needed for moderate pain (pain score 4-6). Patient not taking: Reported on 03/10/2020 11/04/18   Reche Dixon, PA-C  pantoprazole (PROTONIX) 20 MG tablet Take 20 mg by mouth 2 (two) times daily.    [provider]  senna-docusate (SENOKOT-S) 8.6-50 MG tablet Take 1 tablet by mouth 2 (two) times daily.    [provider]  sodium chloride (OCEAN) 0.65 % SOLN nasal spray Place 2 sprays into both nostrils daily as needed for congestion.     [provider]  spironolactone (ALDACTONE) 25 MG tablet Take 25 mg by mouth daily.     [provider]  timolol (BETIMOL) 0.5 % ophthalmic solution Place 1 drop into both eyes daily.     [provider]  topiramate (TOPAMAX) 100 MG tablet Take 100 mg by mouth every evening.    [provider]     Critical care time: 40 min.   Montey Hora, [facility] For pager details, please see AMION or use Epic chat  After 1900, please call [facility] for cross coverage needs 01/26/2022, 1:51 AM

## 2022-01-26 NOTE — ED Notes (Signed)
Per MD Loanne Drilling, 2 unit of blood will not be given until repeat CBC is checked after 1st unit of blood.

## 2022-01-26 NOTE — Consult Note (Addendum)
[place] NOTE  Patient Care Team: Leafy Ro, MD as PCP - General (Internal Medicine)  CHIEF COMPLAINTS/PURPOSE OF CONSULTATION:  Severe pancytopenia  HISTORY OF PRESENTING ILLNESS:  Katrina Hughes 71 y.o. female is here because of recent diagnosis of severe pancytopenia and an oral film in the mouth with ulceration and fatigue.  In the emergency room she was found to have a hemoglobin of 5.7 and platelets of 14.  She is being given blood transfusions.  Because of concern for leukemia rate also performed a bone marrow biopsy. She is awake but somnolent.  She appears to be extremely fatigued.  Her 2 sons are at her bedside.  She is pulling out thick strings of mucus from her mouth. They tell me that recently she was in [place] where she went to the emergency room for the oral mucosal irritation and she was prescribed a mouthwash which helped her but it came back.   I reviewed her records extensively and collaborated the history with the patient.   MEDICAL HISTORY:  Past Medical History:  Diagnosis Date   Anginal pain ([place])    Bell palsy    CHF (congestive heart failure) (H[facility])    Chronic kidney disease    Complication of anesthesia    slow to wake up   Diabetes mellitus without complication ([facility])    Dyspnea    Environmental and seasonal allergies    GERD (gastroesophageal reflux disease)    Glaucoma    Hypertension    Hyperthyroidism    pt states that it's hypothyroidism   Myocardial infarction [facility])    Sleep apnea     SURGICAL HISTORY: Past Surgical History:  Procedure Laterality Date   ABDOMINAL HYSTERECTOMY     APPENDECTOMY     BICEPT TENODESIS Left 06/10/2017   Procedure: BICEPS TENODESIS;  Surgeon: Leim Fabry, MD;  Location: [facility];  Service: Orthopedics;  Laterality: Left;   BREAST REDUCTION SURGERY     CATARACT EXTRACTION, BILATERAL Bilateral    CHOLECYSTECTOMY     CORONARY ANGIOPLASTY WITH STENT PLACEMENT     HERNIA  REPAIR     NASAL HEMORRHAGE CONTROL N/A 09/17/2017   Procedure: EPISTAXIS CONTROL;  Surgeon: [name], MD;  Location: [facility];  Service: ENT;  Laterality: N/A;   REVERSE SHOULDER ARTHROPLASTY Left 06/10/2017   Procedure: REVERSE SHOULDER ARTHROPLASTY;  Surgeon: Leim Fabry, MD;  Location: [facility];  Service: Orthopedics;  Laterality: Left;   REVERSE SHOULDER ARTHROPLASTY Right 11/03/2018   Procedure: REVERSE SHOULDER ARTHROPLASTY, BICEPS TENODESIS;  Surgeon: Leim Fabry, MD;  Location: [facility];  Service: Orthopedics;  Laterality: Right;    SOCIAL HISTORY: Social History   Socioeconomic History   Marital status: Married    Spouse name: Not on file   Number of children: Not on file   Years of education: Not on file   Highest education level: Not on file  Occupational History   Not on file  Tobacco Use   Smoking status: Former    Types: Cigarettes    Quit date: 1980    Years since quitting: 43.8   Smokeless tobacco: Never  Vaping Use   Vaping Use: Former  Substance and Sexual Activity   Alcohol use: Yes    Alcohol/week: 1.0 standard drink of alcohol    Types: 1 Cans of beer per week    Comment: occassionally    Drug use: Not Currently    Types: Marijuana   Sexual activity: Not on file  Other Topics Concern   Not on file  Social History Narrative   Not on file   Social Determinants of Health   Financial Resource Strain: Not on file  Food Insecurity: Not on file  Transportation Needs: Not on file  Physical Activity: Not on file  Stress: Not on file  Social Connections: Not on file  Intimate Partner Violence: Not on file    FAMILY HISTORY: History reviewed. No pertinent family history.  ALLERGIES:  is allergic to elemental sulfur, lisinopril, losartan, penicillins, and aspirin.  MEDICATIONS:  Current Facility-Administered Medications  Medication Dose Route Frequency Provider Last Rate Last Admin   docusate sodium (COLACE) capsule 100 mg  100 mg Oral BID PRN  Desai, Rahul P, PA-C       fentaNYL (SUBLIMAZE) 100 MCG/2ML injection            insulin aspart (novoLOG) injection 0-9 Units  0-9 Units Subcutaneous Q4H Margaretha Seeds, MD   2 Units at 01/26/22 0349   midazolam (VERSED) 2 MG/2ML injection            polyethylene glycol (MIRALAX / GLYCOLAX) packet 17 g  17 g Oral Daily PRN Shearon Stalls, Rahul P, PA-C       Current Outpatient Medications  Medication Sig Dispense Refill   acetaminophen (TYLENOL) 650 MG CR tablet Take 1,300 mg by mouth 2 (two) times daily.     albuterol (PROVENTIL HFA;VENTOLIN HFA) 108 (90 Base) MCG/ACT inhaler Inhale 2 puffs into the lungs every 6 (six) hours as needed for wheezing or shortness of breath.     allopurinol (ZYLOPRIM) 100 MG tablet Take 100 mg by mouth 2 (two) times daily.      brimonidine (ALPHAGAN) 0.2 % ophthalmic solution Place 1 drop into both eyes 2 (two) times a day.     budesonide-formoterol (SYMBICORT) 160-4.5 MCG/ACT inhaler Inhale 2 puffs into the lungs 2 (two) times a day.      carvedilol (COREG) 12.5 MG tablet Take 12.5 mg by mouth 2 (two) times daily with a meal.      cholecalciferol (VITAMIN D3) 25 MCG (1000 UNIT) tablet Take 1,000 Units by mouth daily.     diclofenac sodium (VOLTAREN) 1 % GEL Apply 1 application  topically 2 (two) times daily as needed (pain).     furosemide (LASIX) 40 MG tablet Take 40 mg by mouth 2 (two) times daily.      lactobacillus acidophilus & bulgar (LACTINEX) chewable tablet Chew 1 tablet by mouth 2 (two) times daily.     latanoprost (XALATAN) 0.005 % ophthalmic solution Place 1 drop into both eyes at bedtime.     magic mouthwash (nystatin, lidocaine, diphenhydrAMINE) suspension Take 5 mLs by mouth 4 (four) times daily as needed for mouth pain. Swish and spit 180 mL 0   methimazole (TAPAZOLE) 5 MG tablet Take 5 mg by mouth daily.     nitroGLYCERIN (NITRODUR - DOSED IN MG/24 HR) 0.6 mg/hr patch Place 0.6 mg onto the skin daily. Places at [place], removes at 10p     ondansetron  (ZOFRAN) 4 MG tablet Take 1 tablet (4 mg total) by mouth every 6 (six) hours as needed for nausea. 20 tablet 0   senna-docusate (SENOKOT-S) 8.6-50 MG tablet Take 1 tablet by mouth 2 (two) times daily.     sodium chloride (OCEAN) 0.65 % SOLN nasal spray Place 2 sprays into both nostrils daily as needed for congestion.      spironolactone (ALDACTONE) 25 MG tablet Take 25 mg by  mouth daily.      timolol (BETIMOL) 0.5 % ophthalmic solution Place 1 drop into both eyes daily.      topiramate (TOPAMAX) 100 MG tablet Take 100 mg by mouth every evening.     insulin aspart (NOVOLOG) 100 UNIT/ML injection Inject 3-10 Units into the skin 3 (three) times daily with meals.  (Patient not taking: Reported on 01/26/2022)     insulin glargine (LANTUS) 100 UNIT/ML injection Inject 38 Units into the skin at bedtime.  (Patient not taking: Reported on 01/26/2022)     methotrexate (RHEUMATREX) 2.5 MG tablet Take 25 mg by mouth once a week. Caution:Chemotherapy. Protect from light. Taking 10 tablets once weekly on (Patient not taking: Reported on 01/26/2022)     Facility-Administered Medications Ordered in Other Encounters  Medication Dose Route Frequency Provider Last Rate Last Admin   lidocaine (PF) (XYLOCAINE) 1 % injection 5 mL  5 mL Other Once Desai, Rahul P, PA-C        REVIEW OF SYSTEMS:   Constitutional: Severe generalized fatigue and weakness Thick oral mucosal coating and film that is tenacious and thick  PHYSICAL EXAMINATION: ECOG PERFORMANCE STATUS: 3 - Symptomatic, >50% confined to bed  Vitals:   01/26/22 1400 01/26/22 1415  BP: (!) 141/84 (!) 141/84  Pulse:  88  Resp: 20 20  Temp:  98.6 F (37 C)  SpO2: 99% 99%   There were no vitals filed for this visit.  GENERAL: Able to nod her head to questions.  She appears to be uncomfortable but in no acute distress.  LABORATORY DATA:  I have reviewed the data as listed Lab Results  Component Value Date   WBC 1.4 (LL) 01/26/2022   HGB 7.6 (L)  01/26/2022   HCT 22.3 (L) 01/26/2022   MCV 92.6 01/26/2022   PLT 14 (LL) 01/26/2022   Lab Results  Component Value Date   NA 143 01/26/2022   K 4.2 01/26/2022   CL 115 (H) 01/26/2022   CO2 17 (L) 01/26/2022    RADIOGRAPHIC STUDIES: I have personally reviewed the radiological reports and agreed with the findings in the report.  ASSESSMENT AND PLAN:  Severe pancytopenia: Bone marrow biopsy pulmonary result: No evidence of acute leukemia.  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  On review of her medications, it appears that she has been given methotrexate.  I suspect that the methotrexate is the primary etiology for her pancytopenia and the oral mucosal ulceration.  -We will check W09 and folic acid levels -ANC 0.9: Therefore we do not need to give G-CSF at this time. -Please support with blood transfusions for hemoglobin less than 7 -Support with platelets if platelets are less than 10.  I discussed with the family that if it is methotrexate related then it will take several days for improvement.  Mucositis: With thick film, will prescribe Magic mouthwash. Please follow daily CBC with differential. We discussed PICC line placement but family wishes to hold off on placing any central lines at this time.  Thank you much for involving [place] in her care.   All questions were answered. The patient knows to call the clinic with any problems, questions or concerns.    Harriette Ohara, MD $RemoveB'@T'QptsdtLa$ @

## 2022-01-26 NOTE — Progress Notes (Signed)
eLink Physician-Brief Progress Note Patient Name: Katrina Hughes DOB: 17-May-1950 MRN: [id number]   Date of Service  01/26/2022  HPI/Events of Note  40 F former smoker, hx of DM (A1C 4.6), HTN, HF, CAD s/p stent, CKD, presented with generalized weakness and mucositis. Noted to be pancytopenic, hypotensive and has been transfused.    eICU Interventions  BM biopsy result not consistent with acute leukemia. More likely secondary to methotrexate use. Vit B12 and folate pending Hypotension secondary to hypovolemia resolved No chemical VTE prophylaxis due to risk for bleed     Intervention Category Evaluation Type: New Patient Evaluation  Judd Lien 01/26/2022, 7:24 PM

## 2022-01-26 NOTE — Progress Notes (Signed)
eLink Physician-Brief Progress Note Patient Name: Katrina Hughes DOB: Oct 17, 1950 MRN: [id number]   Date of Service  01/26/2022  HPI/Events of Note  Notified of Hgb 6.7 No report of active bleed  eICU Interventions  Ordered to transfuse 1 unit PRBC Consent to be obtained by bedside team     Intervention Category Intermediate Interventions: Other:  Judd Lien 01/26/2022, 8:51 PM

## 2022-01-26 NOTE — ED Notes (Signed)
ICU provider at bedside

## 2022-01-26 NOTE — ED Notes (Signed)
EDP notified of lactic acid.

## 2022-01-26 NOTE — ED Notes (Signed)
Patient cleaned dried, repositioned, and fresh linen provided, purewick in place.

## 2022-01-26 NOTE — ED Notes (Signed)
Patient going to IR for bone marrow biopsy while blood is still infusing.

## 2022-01-26 NOTE — Progress Notes (Addendum)
Patient seen and examined  71 year old female who was admitted with fatigue and mucosal bleeding severe pancytopenia with hemoglobin 5.3 platelet count less than 5 and white count 1.4  Patient received 2 units of PRBCs and 2 units of platelets  Physical exam: General: Acute on chronically ill-appearing female, lying on the bed HEENT: [place]/AT, eyes anicteric.  moist mucus membranes, dried mucosal bleeding noted Neuro: Awake following commands, lethargic Chest: Coarse breath sounds, no wheezes or rhonchi Heart: Regular rate and rhythm, no murmurs or gallops Abdomen: Soft, nontender, nondistended, bowel sounds present Skin: No rash   Assessment and plan: Severe pancytopenia, the differential drug-induced versus hematologic malignancy Hypovolemic hypotension, resolved Lactic acidosis AKI on CKD stage IV High anion gap metabolic acidosis Diabetes type 2 with episode of hypoglycemia Hypothyroidism  Patient received 2 units of PRBCs and 2 units of platelets Last hemoglobin 7.3 and platelet count of 14 Monitor H&H and platelet count Her ANC is 0.9 Bone marrow biopsy is done, which is negative for acute leukemia Appreciate hematology/oncology input Impression is her presentation might be related to methotrexate toxicity including mucosal bleeding and pancytopenia We will hold off on methotrexate for now Continue IV fluid Trend lactate Monitor intake and output Avoid nephrotoxic agent Monitor fingerstick Admitted to ICU for close monitoring   Total critical care time: 34 minutes  Performed by: Ursa care time was exclusive of separately billable procedures and treating other patients.   Critical care was necessary to treat or prevent imminent or life-threatening deterioration.   Critical care was time spent personally by me on the following activities: development of treatment plan with patient and/or surrogate as well as nursing, discussions with consultants,  evaluation of patient's response to treatment, examination of patient, obtaining history from patient or surrogate, ordering and performing treatments and interventions, ordering and review of laboratory studies, ordering and review of radiographic studies, pulse oximetry and re-evaluation of patient's condition.   Jacky Kindle, MD [facility] See Amion for pager If no response to pager, please call [phone] until 7pm After 7pm, Please call E-link [phone]

## 2022-01-26 NOTE — H&P (Signed)
Chief Complaint: Pancytopenia Request is for bone marrow biopsy for further evaluation for possible acute leukemia.  Referring Physician(s): Montey Hora PA  Supervising Physician: Juliet Rude  Patient Status: [facility] - In-pt  History of Present Illness: Katrina Hughes is a 71 y.o. female  History of  hyperthyroidism, HTN, GERD, DM, CHF. Presented to the ED at [facility] with Chest pain and SHOB X 4 days. Found to have pancytopenia. Team is requesting a bone marrow biopsy for further evaluation of possible acute leukemia.  Patient alert and laying in bed. Family at bedside. Endorses chest pain and SHOB.  Denies any fevers, headache, chest pain, SOB, abdominal pain, nausea, vomiting or bleeding. Return precautions and treatment recommendations and follow-up discussed with the patient /s family including HCPOA are all agreeable with the plan.    Past Medical History:  Diagnosis Date   Anginal pain ([place])    Bell palsy    CHF (congestive heart failure) (HCC)    Chronic kidney disease    Complication of anesthesia    slow to wake up   Diabetes mellitus without complication (HCC)    Dyspnea    Environmental and seasonal allergies    GERD (gastroesophageal reflux disease)    Glaucoma    Hypertension    Hyperthyroidism    pt states that it's hypothyroidism   Myocardial infarction [facility])    Sleep apnea     Past Surgical History:  Procedure Laterality Date   ABDOMINAL HYSTERECTOMY     APPENDECTOMY     BICEPT TENODESIS Left 06/10/2017   Procedure: BICEPS TENODESIS;  Surgeon: Leim Fabry, MD;  Location: [facility] ORS;  Service: Orthopedics;  Laterality: Left;   BREAST REDUCTION SURGERY     CATARACT EXTRACTION, BILATERAL Bilateral    CHOLECYSTECTOMY     CORONARY ANGIOPLASTY WITH STENT PLACEMENT     HERNIA REPAIR     NASAL HEMORRHAGE CONTROL N/A 09/17/2017   Procedure: EPISTAXIS CONTROL;  Surgeon: [name], MD;  Location: [place];  Service: ENT;  Laterality: N/A;   REVERSE SHOULDER  ARTHROPLASTY Left 06/10/2017   Procedure: REVERSE SHOULDER ARTHROPLASTY;  Surgeon: Leim Fabry, MD;  Location: [facility] ORS;  Service: Orthopedics;  Laterality: Left;   REVERSE SHOULDER ARTHROPLASTY Right 11/03/2018   Procedure: REVERSE SHOULDER ARTHROPLASTY, BICEPS TENODESIS;  Surgeon: Leim Fabry, MD;  Location: [facility] ORS;  Service: Orthopedics;  Laterality: Right;    Allergies: Elemental sulfur, Lisinopril, Losartan, Penicillins, and Aspirin  Medications: Prior to Admission medications   Medication Sig Start Date End Date Taking? Authorizing Provider  acetaminophen (TYLENOL) 650 MG CR tablet Take 1,300 mg by mouth 2 (two) times daily.   Yes [provider]  albuterol (PROVENTIL HFA;VENTOLIN HFA) 108 (90 Base) MCG/ACT inhaler Inhale 2 puffs into the lungs every 6 (six) hours as needed for wheezing or shortness of breath.   Yes [provider]  allopurinol (ZYLOPRIM) 100 MG tablet Take 100 mg by mouth 2 (two) times daily.    Yes [provider]  brimonidine (ALPHAGAN) 0.2 % ophthalmic solution Place 1 drop into both eyes 2 (two) times a day.   Yes [provider]  budesonide-formoterol (SYMBICORT) 160-4.5 MCG/ACT inhaler Inhale 2 puffs into the lungs 2 (two) times a day.    Yes [provider]  carvedilol (COREG) 12.5 MG tablet Take 12.5 mg by mouth 2 (two) times daily with a meal.    Yes [provider]  cholecalciferol (VITAMIN D3) 25 MCG (1000 UNIT) tablet Take 1,000 Units by mouth  daily.   Yes [provider]  diclofenac sodium (VOLTAREN) 1 % GEL Apply 1 application  topically 2 (two) times daily as needed (pain).   Yes [provider]  furosemide (LASIX) 40 MG tablet Take 40 mg by mouth 2 (two) times daily.    Yes [provider]  lactobacillus acidophilus & bulgar (LACTINEX) chewable tablet Chew 1 tablet by mouth 2 (two) times daily.   Yes [provider]  latanoprost (XALATAN) 0.005 % ophthalmic solution  Place 1 drop into both eyes at bedtime.   Yes [provider]  magic mouthwash (nystatin, lidocaine, diphenhydrAMINE) suspension Take 5 mLs by mouth 4 (four) times daily as needed for mouth pain. Swish and spit 12/15/21  Yes Raspet, Erin K, PA-C  methimazole (TAPAZOLE) 5 MG tablet Take 5 mg by mouth daily.   Yes [provider]  methotrexate (RHEUMATREX) 2.5 MG tablet Take 25 mg by mouth once a week. Caution:Chemotherapy. Protect from light. Taking 10 tablets once weekly on   Yes [provider]  nitroGLYCERIN (NITRODUR - DOSED IN MG/24 HR) 0.6 mg/hr patch Place 0.6 mg onto the skin daily. Places at [place], removes at 10p   Yes [provider]  ondansetron (ZOFRAN) 4 MG tablet Take 1 tablet (4 mg total) by mouth every 6 (six) hours as needed for nausea. 11/04/18  Yes Reche Dixon, PA-C  pantoprazole (PROTONIX) 20 MG tablet Take 20 mg by mouth 2 (two) times daily.   Yes [provider]  senna-docusate (SENOKOT-S) 8.6-50 MG tablet Take 1 tablet by mouth 2 (two) times daily.   Yes [provider]  sodium chloride (OCEAN) 0.65 % SOLN nasal spray Place 2 sprays into both nostrils daily as needed for congestion.    Yes [provider]  spironolactone (ALDACTONE) 25 MG tablet Take 25 mg by mouth daily.    Yes [provider]  timolol (BETIMOL) 0.5 % ophthalmic solution Place 1 drop into both eyes daily.    Yes [provider]  topiramate (TOPAMAX) 100 MG tablet Take 100 mg by mouth every evening.   Yes [provider]  insulin aspart (NOVOLOG) 100 UNIT/ML injection Inject 3-10 Units into the skin 3 (three) times daily with meals.  Patient not taking: Reported on 01/26/2022    [provider]  insulin glargine (LANTUS) 100 UNIT/ML injection Inject 38 Units into the skin at bedtime.  Patient not taking: Reported on 01/26/2022    [provider]     History reviewed. No pertinent family history.  Social  History   Socioeconomic History   Marital status: Married    Spouse name: Not on file   Number of children: Not on file   Years of education: Not on file   Highest education level: Not on file  Occupational History   Not on file  Tobacco Use   Smoking status: Former    Types: Cigarettes    Quit date: 1980    Years since quitting: 43.8   Smokeless tobacco: Never  Vaping Use   Vaping Use: Former  Substance and Sexual Activity   Alcohol use: Yes    Alcohol/week: 1.0 standard drink of alcohol    Types: 1 Cans of beer per week    Comment: occassionally    Drug use: Not Currently    Types: Marijuana   Sexual activity: Not on file  Other Topics Concern   Not on file  Social History Narrative   Not on file   Social  Determinants of Health   Financial Resource Strain: Not on file  Food Insecurity: Not on file  Transportation Needs: Not on file  Physical Activity: Not on file  Stress: Not on file  Social Connections: Not on file    Review of Systems: A 12 point ROS discussed and pertinent positives are indicated in the HPI above.  All other systems are negative.  Review of Systems  Constitutional:  Negative for fatigue and fever.  HENT:  Negative for congestion.   Respiratory:  Positive for shortness of breath. Negative for cough.   Cardiovascular:  Positive for chest pain.  Gastrointestinal:  Negative for abdominal pain, diarrhea, nausea and vomiting.    Vital Signs: BP 119/87   Pulse 89   Temp 99.6 F (37.6 C) (Axillary)   Resp (!) 24   SpO2 98%    Physical Exam Vitals and nursing note reviewed.  Constitutional:      Appearance: She is ill-appearing.  HENT:     Head: Normocephalic and atraumatic.  Eyes:     Conjunctiva/sclera: Conjunctivae normal.  Cardiovascular:     Heart sounds: Normal heart sounds.  Pulmonary:     Effort: Pulmonary effort is normal.     Breath sounds: Examination of the right-upper field reveals decreased breath sounds. Examination  of the left-upper field reveals decreased breath sounds. Decreased breath sounds present.  Musculoskeletal:        General: Normal range of motion.     Cervical back: Normal range of motion.  Skin:    General: Skin is dry.     Coloration: Skin is pale.  Neurological:     Mental Status: She is alert and oriented to person, place, and time.     Imaging: CT CHEST ABDOMEN PELVIS WO CONTRAST  Result Date: 01/26/2022 CLINICAL DATA:  Sepsis. EXAM: CT CHEST, ABDOMEN AND PELVIS WITHOUT CONTRAST TECHNIQUE: Multidetector CT imaging of the chest, abdomen and pelvis was performed following the standard protocol without IV contrast. RADIATION DOSE REDUCTION: This exam was performed according to the departmental dose-optimization program which includes automated exposure control, adjustment of the mA and/or kV according to patient size and/or use of iterative reconstruction technique. COMPARISON:  Chest radiograph dated 02/06/2022. FINDINGS: Evaluation of this exam is limited in the absence of intravenous contrast. CT CHEST FINDINGS Cardiovascular: There is no cardiomegaly or pericardial effusion. There is coronary vascular calcification. Mild atherosclerotic calcification of the thoracic aorta. No aneurysmal dilatation. The central pulmonary arteries are grossly unremarkable. Mediastinum/Nodes: No hilar or mediastinal adenopathy. The esophagus is grossly unremarkable. Lobulated appearance of the thyroid gland with rim calcified inferior isthmic nodule measuring up to 2 cm. Recommend thyroid US (ref: J Am Coll Radiol. 2015 Feb;12(2): 143-50).No mediastinal fluid collection. Lungs/Pleura: Bibasilar subpleural atelectasis and areas of linear scarring. No focal consolidation, pleural effusion, or pneumothorax. The central airways are patent. Musculoskeletal: Bilateral shoulder arthroplasties. No acute osseous pathology. CT ABDOMEN PELVIS FINDINGS No intra-abdominal free air or free fluid. Hepatobiliary: The liver is  grossly unremarkable. There is sludge within the gallbladder. No pericholecystic fluid. Pancreas: Unremarkable. No pancreatic ductal dilatation or surrounding inflammatory changes. Spleen: Normal in size without focal abnormality. Adrenals/Urinary Tract: Bilateral adrenal thickening or hyperplasia. There is no hydronephrosis or nephrolithiasis on either side. Small bilateral renal cysts and additional hypodense lesions which are too small to characterize. This can be better evaluated with ultrasound on a nonemergent/outpatient basis. The visualized ureters and urinary bladder appear unremarkable. Stomach/Bowel: There is sigmoid diverticulosis without active inflammatory changes. There is  no bowel obstruction or active inflammation. The appendix is not visualized with certainty. No inflammatory changes identified in the right lower quadrant. Vascular/Lymphatic: Mild aortoiliac atherosclerotic disease. The IVC is unremarkable. No portal venous gas. There is no adenopathy. Reproductive: Hysterectomy.  No adnexal masses. Other: Ventral hernia repair with mesh. Multiple scattered surgical clips in the abdomen. Musculoskeletal: Degenerative changes of the spine. No acute osseous pathology. IMPRESSION: 1. No acute intrathoracic, abdominal, or pelvic pathology. 2. Sigmoid diverticulosis. No bowel obstruction. 3. Small bilateral renal cysts and additional hypodense lesions which are too small to characterize. 4.  Aortic Atherosclerosis (ICD10-I70.0). Electronically Signed   By: Anner Crete M.D.   On: 01/26/2022 01:40   CT Head Wo Contrast  Result Date: 01/26/2022 CLINICAL DATA:  Altered mental status. EXAM: CT HEAD WITHOUT CONTRAST TECHNIQUE: Contiguous axial images were obtained from the base of the skull through the vertex without intravenous contrast. RADIATION DOSE REDUCTION: This exam was performed according to the departmental dose-optimization program which includes automated exposure control, adjustment of  the mA and/or kV according to patient size and/or use of iterative reconstruction technique. COMPARISON:  None Available. FINDINGS: Brain: Mild age-related atrophy and chronic microvascular ischemic changes. Small scattered periventricular and deep white matter hypodense foci may represent chronic microvascular ischemic changes, old lacunar infarcts, or white matter disease. There is no acute intracranial hemorrhage. No mass effect or midline shift. No extra-axial fluid collection. Vascular: No hyperdense vessel or unexpected calcification. Skull: Normal. Negative for fracture or focal lesion. Sinuses/Orbits: No acute finding. Other: None IMPRESSION: 1. No acute intracranial pathology. 2. Mild age-related atrophy and chronic microvascular ischemic changes. Electronically Signed   By: Anner Crete M.D.   On: 01/26/2022 01:08   DG CHEST PORT 1 VIEW  Result Date: 01/26/2022 CLINICAL DATA:  Shortness of breath. EXAM: PORTABLE CHEST 1 VIEW COMPARISON:  Chest radiograph dated 09/17/2017. FINDINGS: No focal consolidation, pleural effusion, pneumothorax. The cardiac silhouette is within normal limits. No acute osseous pathology. Bilateral shoulder arthroplasty. IMPRESSION: No active disease. Electronically Signed   By: Anner Crete M.D.   On: 01/24/2022 23:48    Labs:  CBC: Recent Labs    01/15/2022 2319 01/26/22 0000 01/26/22 0051 01/26/22 0052 01/26/22 0107 01/26/22 0605  WBC 1.9*  --   --   --  1.7* 1.4*  HGB 5.6*  --  6.5* 5.4* 5.3* 5.7*  HCT 18.2*  --  19.0* 16.0* 17.6* 17.4*  PLT <5* <5*  --   --  <5* 14*    COAGS: Recent Labs    01/26/22 0000  INR 1.5*  APTT 27    BMP: Recent Labs    01/22/2022 2319 01/26/22 0051 01/26/22 0052 01/26/22 0330  NA 143 142 141 143  K 4.7 5.1 5.1 4.2  CL 113* 114*  --  115*  CO2 14*  --   --  17*  GLUCOSE 235* 227*  --  197*  BUN 66* 63*  --  67*  CALCIUM 10.4*  --   --  9.7  CREATININE 3.24* 3.10*  --  3.01*  GFRNONAA 15*  --   --  16*     LIVER FUNCTION TESTS: Recent Labs    02/01/2022 2319  BILITOT 1.8*  AST 48*  ALT 35  ALKPHOS 71  PROT 6.0*  ALBUMIN 2.4*    Assessment and Plan:  71 y.o. female inpatient. History of  hyperthyroidism, HTN, GERD, DM, CHF. Presented to the ED at [facility] with Chest pain and SHOB X  4 days. Found to have thrombocytopenia. Team is requesting a bone marrow biopsy for further evaluation of possible acute leukemia.  WBC 1.4, Hgb 5.7, PLT 14. BUN 67, Cr 3.01. IR Attending Dr. Denna Haggard made aware of labs and is agreeable to proceed. Patient has been NPO since midnight.   Risks and benefits of bone marrow biopsy was discussed with the patient and/or patient's family including, but not limited to bleeding, infection, damage to adjacent structures or low yield requiring additional tests.  All of the questions were answered and there is agreement to proceed.  Consent signed and in chart.   Thank you for this interesting consult.  I greatly enjoyed meeting Katrina Hughes and look forward to participating in their care.  A copy of this report was sent to the requesting provider on this date.  Electronically Signed: Jacqualine Mau, NP 01/26/2022, 9:22 AM   I spent a total of 40 Minutes    in face to face in clinical consultation, greater than 50% of which was counseling/coordinating care for bone marrow biopsy

## 2022-01-26 NOTE — Procedures (Signed)
Interventional Radiology Procedure Note  Date of Procedure: 01/26/2022  Procedure: BMBx  Findings:  1. CT BMBx right ilium    Complications: No immediate complications noted.   Estimated Blood Loss: minimal  Follow-up and Recommendations: 1. Bedrest 1 hour    Albin Felling, MD  Vascular & Interventional Radiology  01/26/2022 11:07 AM

## 2022-01-27 ENCOUNTER — Inpatient Hospital Stay: Payer: Self-pay

## 2022-01-27 DIAGNOSIS — I4891 Unspecified atrial fibrillation: Secondary | ICD-10-CM

## 2022-01-27 DIAGNOSIS — N179 Acute kidney failure, unspecified: Secondary | ICD-10-CM

## 2022-01-27 LAB — PREPARE PLATELET PHERESIS
Unit division: 0
Unit division: 0

## 2022-01-27 LAB — TYPE AND SCREEN
ABO/RH(D): O POS
Antibody Screen: NEGATIVE
Unit division: 0
Unit division: 0
Unit division: 0

## 2022-01-27 LAB — CBC
HCT: 26.4 % — ABNORMAL LOW (ref 36.0–46.0)
Hemoglobin: 8.9 g/dL — ABNORMAL LOW (ref 12.0–15.0)
MCH: 30.7 pg (ref 26.0–34.0)
MCHC: 33.7 g/dL (ref 30.0–36.0)
MCV: 91 fL (ref 80.0–100.0)
Platelets: 8 10*3/uL — CL (ref 150–400)
RBC: 2.9 MIL/uL — ABNORMAL LOW (ref 3.87–5.11)
RDW: 19.6 % — ABNORMAL HIGH (ref 11.5–15.5)
WBC: 0.6 10*3/uL — CL (ref 4.0–10.5)
nRBC: 0 % (ref 0.0–0.2)

## 2022-01-27 LAB — BPAM RBC
Blood Product Expiration Date: 202310222359
Blood Product Expiration Date: 202310242359
Blood Product Expiration Date: 202311162359
ISSUE DATE / TIME: 202310200123
ISSUE DATE / TIME: 202310200704
ISSUE DATE / TIME: 202310202144
Unit Type and Rh: 5100
Unit Type and Rh: 5100
Unit Type and Rh: 9500

## 2022-01-27 LAB — BPAM PLATELET PHERESIS
Blood Product Expiration Date: 202310212359
Blood Product Expiration Date: 202310222359
ISSUE DATE / TIME: 202310200123
ISSUE DATE / TIME: 202310200704
Unit Type and Rh: 5100
Unit Type and Rh: 5100

## 2022-01-27 LAB — BASIC METABOLIC PANEL
Anion gap: 6 (ref 5–15)
BUN: 69 mg/dL — ABNORMAL HIGH (ref 8–23)
CO2: 20 mmol/L — ABNORMAL LOW (ref 22–32)
Calcium: 10.3 mg/dL (ref 8.9–10.3)
Chloride: 124 mmol/L — ABNORMAL HIGH (ref 98–111)
Creatinine, Ser: 2.28 mg/dL — ABNORMAL HIGH (ref 0.44–1.00)
GFR, Estimated: 22 mL/min — ABNORMAL LOW (ref >60)
Glucose, Bld: 156 mg/dL — ABNORMAL HIGH (ref 70–99)
Potassium: 3.8 mmol/L (ref 3.5–5.1)
Sodium: 150 mmol/L — ABNORMAL HIGH (ref 135–145)

## 2022-01-27 LAB — LACTIC ACID, PLASMA
Lactic Acid, Venous: 2.5 mmol/L (ref 0.5–1.9)
Lactic Acid, Venous: 2.8 mmol/L (ref 0.5–1.9)

## 2022-01-27 LAB — HEMOGLOBIN AND HEMATOCRIT, BLOOD
HCT: 26.5 % — ABNORMAL LOW (ref 36.0–46.0)
Hemoglobin: 9.2 g/dL — ABNORMAL LOW (ref 12.0–15.0)

## 2022-01-27 MED ORDER — FOLIC ACID 5 MG/ML IJ SOLN
1.0000 mg | Freq: Every day | INTRAMUSCULAR | Status: DC
  Administered 2022-01-27 – 2022-01-29 (×3): 1 mg via INTRAVENOUS
  Filled 2022-01-27 (×4): qty 0.2

## 2022-01-27 MED ORDER — SODIUM CHLORIDE 0.9% FLUSH
10.0000 mL | Freq: Two times a day (BID) | INTRAVENOUS | Status: DC
  Administered 2022-01-27 – 2022-01-29 (×3): 10 mL

## 2022-01-27 MED ORDER — FILGRASTIM 300 MCG/ML IJ SOLN
300.0000 ug | Freq: Once | INTRAVENOUS | Status: AC
  Administered 2022-01-27: 300 ug via INTRAVENOUS
  Filled 2022-01-27: qty 1

## 2022-01-27 MED ORDER — METOPROLOL TARTRATE 5 MG/5ML IV SOLN
5.0000 mg | INTRAVENOUS | Status: DC | PRN
  Administered 2022-01-27 – 2022-01-29 (×6): 5 mg via INTRAVENOUS
  Filled 2022-01-27 (×5): qty 5

## 2022-01-27 MED ORDER — DILTIAZEM HCL-DEXTROSE 125-5 MG/125ML-% IV SOLN (PREMIX)
5.0000 mg/h | INTRAVENOUS | Status: DC
  Administered 2022-01-27: 5 mg/h via INTRAVENOUS
  Administered 2022-01-28: 15 mg/h via INTRAVENOUS
  Filled 2022-01-27 (×2): qty 125

## 2022-01-27 MED ORDER — SODIUM CHLORIDE 0.9% IV SOLUTION: Freq: Once | INTRAVENOUS | Status: DC

## 2022-01-27 MED ORDER — METOPROLOL TARTRATE 5 MG/5ML IV SOLN
INTRAVENOUS | Status: AC
  Filled 2022-01-27: qty 5

## 2022-01-27 MED ORDER — LIDOCAINE VISCOUS HCL 2 % MT SOLN
15.0000 mL | Freq: Four times a day (QID) | OROMUCOSAL | Status: DC | PRN
  Administered 2022-01-27 – 2022-01-29 (×4): 15 mL via OROMUCOSAL
  Filled 2022-01-27 (×5): qty 15

## 2022-01-27 MED ORDER — LACTATED RINGERS IV BOLUS
500.0000 mL | Freq: Once | INTRAVENOUS | Status: AC
  Administered 2022-01-27: 500 mL via INTRAVENOUS

## 2022-01-27 MED ORDER — NITROGLYCERIN 0.6 MG/HR TD PT24
0.6000 mg | MEDICATED_PATCH | Freq: Every day | TRANSDERMAL | Status: DC
  Administered 2022-01-27 – 2022-01-29 (×3): 0.6 mg via TRANSDERMAL
  Filled 2022-01-27 (×3): qty 1

## 2022-01-27 MED ORDER — DEXTROSE-NACL 5-0.2 % IV SOLN: INTRAVENOUS | Status: DC

## 2022-01-27 MED ORDER — SODIUM CHLORIDE 0.9% FLUSH: 10.0000 mL | INTRAVENOUS | Status: DC | PRN

## 2022-01-27 MED ORDER — MAGIC MOUTHWASH: 5.0000 mL | Freq: Four times a day (QID) | ORAL | Status: DC | PRN

## 2022-01-27 MED ORDER — DEXTROSE-NACL 5-0.45 % IV SOLN: INTRAVENOUS | Status: DC

## 2022-01-27 NOTE — Progress Notes (Signed)
Repeat labs reviewed We will transfuse 1 unit of platelets and since WBC count is low at 0.6, will dose Neupogen x1 We will also start D5 half at 50 cc an hour Now rate controlled with Cardizem at 5 mg an hour, blood pressure holding.  Son updated at bedside  Additional critical care time was 15 minutes  Vandy Fong V. Elsworth Soho MD

## 2022-01-27 NOTE — Progress Notes (Signed)
Peripherally Inserted Central Catheter Placement  The IV Nurse has discussed with the patient and/or persons authorized to consent for the patient, the purpose of this procedure and the potential benefits and risks involved with this procedure.  The benefits include less needle sticks, lab draws from the catheter, and the patient may be discharged home with the catheter. Risks include, but not limited to, infection, bleeding, blood clot (thrombus formation), and puncture of an artery; nerve damage and irregular heartbeat and possibility to perform a PICC exchange if needed/ordered by physician.  Alternatives to this procedure were also discussed.  Bard Power PICC patient education guide, fact sheet on infection prevention and patient information card has been provided to patient /or left at bedside.  Nephew at bedside signed consent, pt verbalizes agreement.  PICC Placement Documentation  PICC Double Lumen 87/86/76 Right Basilic 38 cm 1 cm (Active)  Indication for Insertion or Continuance of Line Vasoactive infusions 01/27/22 1828  Exposed Catheter (cm) 1 cm 01/27/22 1828  Site Assessment Clean, Dry, Intact 01/27/22 1828  Lumen #1 Status Flushed;Saline locked;Blood return noted 01/27/22 1828  Lumen #2 Status Flushed;Saline locked;Blood return noted 01/27/22 1828  Dressing Type Transparent;Securing device 01/27/22 1828  Dressing Status Antimicrobial disc in place;Clean, Dry, Intact 01/27/22 1828  Safety Lock Not Applicable 72/09/47 0962  Line Care Connections checked and tightened 01/27/22 1828  Line Adjustment (NICU/IV Team Only) No 01/27/22 1828  Dressing Intervention New dressing 01/27/22 1828  Dressing Change Due 02/03/22 01/27/22 1828       Rolena Infante 01/27/2022, 6:28 PM

## 2022-01-27 NOTE — Progress Notes (Signed)
[place] Progress Note Patient Name: Breslin Burklow DOB: Aug 19, 1950 MRN: [id number]   Date of Service  01/27/2022  HPI/Events of Note  Lactate 2.8 from 2.5 Oliguric  eICU Interventions  Ordered 500 cc LR bolus     Intervention Category Intermediate Interventions: Other:  Judd Lien 01/27/2022, 3:52 AM

## 2022-01-27 NOTE — Progress Notes (Signed)
PICC ordered for critical drips and frequent labs with low platelets.  Hx of CKD not followed by nephrology, addressed with Dr Elsworth Soho.

## 2022-01-27 NOTE — Progress Notes (Signed)
eLink Physician-Brief Progress Note Patient Name: Katrina Hughes DOB: 26-Nov-1950 MRN: [id number]   Date of Service  01/27/2022  HPI/Events of Note  Lactic acid 2.5 No hemodynamic changes  eICU Interventions  Transfusion ongoing when lactate drawn. Continue to trend     Intervention Category Intermediate Interventions: Other:  Judd Lien 01/27/2022, 12:48 AM

## 2022-01-27 NOTE — Progress Notes (Addendum)
NAME:  Katrina Hughes, MRN:  [id number], DOB:  03/22/1951, LOS: 1 ADMISSION DATE:  01/27/2022, CONSULTATION DATE:  01/26/22 REFERRING MD:  Madilyn Hook CHIEF COMPLAINT:  Chest pain, SOB   History of Present Illness:  Katrina Hughes is a 71 y.o. female who has a PMH as below including but not limited to HTN, CHF, DM. She presented to [facility] ED 10/19 with chest pain and dyspnea that began 4 days prior and persisted.   10/19, she had central chest pain. Non-radiating with no diaphoresis, lightheadedness, arm tingling. Had some dyspnea and cough productive of clear sputum. Denied any nausea and vomiting but did endorse diarrhea with 2 - 3 loose stools per day for past few days. Diarrhea was non bloody. Sister noticed that pt's gums were bloody on 10/19 during a facetime call and this along with ongoing not feeling well was what prompted ED evaluation. Pt states bloody gums and lips started that same day but was mild. Denies any additional bleeding. Step son did state that pt's thighs were bruised roughly 1 month ago but it did not worsen and they didn't think much of it.  Blood work returned with CBC noteable for WBC 1.7, Hgb 5.3, PLT <5. D-dimer 6.67, Fibrinogen 652. Peripheral smear was negative for schistocytes. CT head and CT chest/abd/pelv were negative for acute process.    Pertinent  Medical History:  has Status post shoulder replacement; S/p reverse total shoulder arthroplasty; Pancytopenia (HCC); Chronic kidney disease; Thrombocytopenia (HCC); AKI (acute kidney injury) (HCC); and Mucositis on their problem list.  Significant Hospital Events: Including procedures, antibiotic start and stop dates in addition to other pertinent events   10/20 admit.>>2u PRBC and 1u platelets  10/20 bone marrow biopsy>>  No evidence of acute leukemia.  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 On room air, afebrile. Heart rate 150s this morning Denies chest  pain, complaints of palpitations   Objective:  Blood pressure 136/79, pulse 92, temperature 98.7 F (37.1 C), temperature source Oral, resp. rate 16, weight 75.6 kg, SpO2 95 %.        Intake/Output Summary (Last 24 hours) at 01/27/2022 0828 Last data filed at 01/27/2022 0600 Gross per 24 hour  Intake 1432.86 ml  Output 750 ml  Net 682.86 ml   Filed Weights   01/27/22 0500  Weight: 75.6 kg    Examination: General: Elderly woman, sitting in bed, no distress Neuro: Awake, directive, nonfocal HEENT: [place]/AT. Sclerae anicteric. EOMI. Bloody gums/lips, able to open mouth halfway, unable to move tongue much Cardiovascular: S1 S2 tacky, irregular Lungs: No accessory muscle use, clear to auscultation bilateral Abdomen: BS x 4, soft, NT/ND.  Musculoskeletal: No gross deformities, no edema.  Skin: Intact, warm, no rashes.  Labs/imaging personally reviewed:  CT head 10/20 > neg. CT chest/abd/pelv > sigmoid diverticulosis.  EKG appears atrial fibrillation, irregular, right bundle branch block pattern  Labs show lactate increased to 2.8, hemoglobin improved to 9.2 normal B12 levels Folate low at 3.8  Assessment & Plan:   Pancytopenia of unclear etiology - peripheral smear negative for schistocytes  -Bone marrow biopsy negative for leukemia, megaloblastic changes consistent with methotrexate -  - Maintain Hgb > 7. -Monitor platelets, goal more than 50 - F/u on ADAMTS13, doubt TTP  Atrial fibrillation/RVR -Use Lopressor 5 every 6 instead of carvedilol 12.5 twice daily when she can take oral. -May need Cardizem drip if this does not control heart rate -Not a candidate for anticoagulation  Chest pain - central, non-radiating. Seems intermittent but difficult to ascertain given poor history. She did take 1 aspirin a few days PTA. Initial EKG normal. -NTG patch  Hypotension with lactic acidosis -Related to anemia and hypovolemia, resolved  AoCKD. - Supportive care. - Follow  BMP.  Hx HTN, MI, CHF. -Holding meds until clarified and she can take oral  Hx DM - Per son, she has been off of insulin for a while now. - SSI if glucose consistently > 180.  Hx Hyperthyroidism. -TSH is very low, okay to hold methimazole for a few days  ? Home medication accuracy - per son, some of her meds listed on MAR are not active. - Will ask pharmacy to assist with med rec, hold on reordering any home meds for now.   Best practice (evaluated daily):  Diet/type: full liquids  DVT prophylaxis: not indicated GI prophylaxis: N/A Lines: N/A Foley:  N/A Code Status:  full code Last date of multidisciplinary goals of care discussion: Step son updated at bedside.  Labs   CBC: Recent Labs  Lab 01/08/2022 2319 01/26/22 0000 01/26/22 0051 01/26/22 0107 01/26/22 0605 01/26/22 1207 01/26/22 2005 01/27/22 0134  WBC 1.9*  --   --  1.7* 1.4*  --   --   --   NEUTROABS 1.1*  --   --   --  0.9*  --   --   --   HGB 5.6*  --    < > 5.3* 5.7* 7.6* 6.7* 9.2*  HCT 18.2*  --    < > 17.6* 17.4* 22.3* 20.0* 26.5*  MCV 104.0*  --   --  103.5* 92.6  --   --   --   PLT <5* <5*  --  <5* 14*  --   --   --    < > = values in this interval not displayed.     Basic Metabolic Panel: Recent Labs  Lab 01/08/2022 2319 01/26/22 0051 01/26/22 0052 01/26/22 0107 01/26/22 0330  NA 143 142 141  --  143  K 4.7 5.1 5.1  --  4.2  CL 113* 114*  --   --  115*  CO2 14*  --   --   --  17*  GLUCOSE 235* 227*  --   --  197*  BUN 66* 63*  --   --  67*  CREATININE 3.24* 3.10*  --   --  3.01*  CALCIUM 10.4*  --   --   --  9.7  MG  --   --   --  2.5* 2.4  PHOS  --   --   --  5.6* 5.8*    GFR: CrCl cannot be calculated (Unknown ideal weight.). Recent Labs  Lab 01/21/2022 2319 01/26/22 0015 01/26/22 0107 01/26/22 0605 01/26/22 2005 01/26/22 2340 01/27/22 0134  WBC 1.9*  --  1.7* 1.4*  --   --   --   LATICACIDVEN  --  7.1*  --   --  1.5 2.5* 2.8*     Liver Function Tests: Recent Labs  Lab  01/18/2022 2319  AST 48*  ALT 35  ALKPHOS 71  BILITOT 1.8*  PROT 6.0*  ALBUMIN 2.4*    Recent Labs  Lab 01/26/22 0107  LIPASE 36   No results for input(s): "AMMONIA" in the last 168 hours.  ABG    Component Value Date/Time   HCO3 13.2 (L) 01/26/2022 0052   TCO2 14 (L) 01/26/2022 0052   ACIDBASEDEF 13.0 (  H) 01/26/2022 0052   O2SAT 99 01/26/2022 0052     Coagulation Profile: Recent Labs  Lab 01/26/22 0000  INR 1.5*     Cardiac Enzymes: No results for input(s): "CKTOTAL", "CKMB", "CKMBINDEX", "TROPONINI" in the last 168 hours.  HbA1C: Hgb A1c MFr Bld  Date/Time Value Ref Range Status  01/26/2022 06:05 AM 4.6 (L) 4.8 - 5.6 % Final    Comment:    (NOTE) Pre diabetes:          5.7%-6.4%  Diabetes:              >6.4%  Glycemic control for   <7.0% adults with diabetes   11/03/2018 03:40 PM 7.6 (H) 4.8 - 5.6 % Final    Comment:    (NOTE) Pre diabetes:          5.7%-6.4% Diabetes:              >6.4% Glycemic control for   <7.0% adults with diabetes     CBG: Recent Labs  Lab 01/21/2022 2353 01/26/22 0329 01/26/22 0828 01/26/22 1924 01/26/22 2314  GLUCAP 207* 170* 119* 133* 143*     Critical care time: 34 min.   Kara Mead MD. Shade Flood.  Pulmonary & Critical care Pager : [phone]  If no response to pager , please call [phone] until 7 pm After 7:00 pm call Elink  [phone]   01/27/2022  01/27/2022, 8:28 AM

## 2022-01-28 LAB — BASIC METABOLIC PANEL
Anion gap: 8 (ref 5–15)
Anion gap: 8 (ref 5–15)
BUN: 73 mg/dL — ABNORMAL HIGH (ref 8–23)
BUN: 81 mg/dL — ABNORMAL HIGH (ref 8–23)
CO2: 21 mmol/L — ABNORMAL LOW (ref 22–32)
CO2: 21 mmol/L — ABNORMAL LOW (ref 22–32)
Calcium: 11.4 mg/dL — ABNORMAL HIGH (ref 8.9–10.3)
Calcium: 11.7 mg/dL — ABNORMAL HIGH (ref 8.9–10.3)
Chloride: 128 mmol/L — ABNORMAL HIGH (ref 98–111)
Chloride: 129 mmol/L — ABNORMAL HIGH (ref 98–111)
Creatinine, Ser: 2.48 mg/dL — ABNORMAL HIGH (ref 0.44–1.00)
Creatinine, Ser: 2.35 mg/dL — ABNORMAL HIGH (ref 0.44–1.00)
GFR, Estimated: 20 mL/min — ABNORMAL LOW (ref >60)
GFR, Estimated: 22 mL/min — ABNORMAL LOW (ref >60)
Glucose, Bld: 209 mg/dL — ABNORMAL HIGH (ref 70–99)
Glucose, Bld: 231 mg/dL — ABNORMAL HIGH (ref 70–99)
Potassium: 3.3 mmol/L — ABNORMAL LOW (ref 3.5–5.1)
Potassium: 3.7 mmol/L (ref 3.5–5.1)
Sodium: 157 mmol/L — ABNORMAL HIGH (ref 135–145)
Sodium: 158 mmol/L — ABNORMAL HIGH (ref 135–145)

## 2022-01-28 LAB — CBC WITH DIFFERENTIAL/PLATELET
Abs Immature Granulocytes: 0 10*3/uL (ref 0.00–0.07)
Basophils Absolute: 0 10*3/uL (ref 0.0–0.1)
Basophils Relative: 0 %
Eosinophils Absolute: 0 10*3/uL (ref 0.0–0.5)
Eosinophils Relative: 0 %
HCT: 26.1 % — ABNORMAL LOW (ref 36.0–46.0)
Hemoglobin: 8.7 g/dL — ABNORMAL LOW (ref 12.0–15.0)
Immature Granulocytes: 0 %
Lymphocytes Relative: 95 %
Lymphs Abs: 0.4 10*3/uL — ABNORMAL LOW (ref 0.7–4.0)
MCH: 30 pg (ref 26.0–34.0)
MCHC: 33.3 g/dL (ref 30.0–36.0)
MCV: 90 fL (ref 80.0–100.0)
Monocytes Absolute: 0 10*3/uL — ABNORMAL LOW (ref 0.1–1.0)
Monocytes Relative: 0 %
Neutro Abs: 0 10*3/uL — CL (ref 1.7–7.7)
Neutrophils Relative %: 5 %
Platelets: 12 10*3/uL — CL (ref 150–400)
RBC: 2.9 MIL/uL — ABNORMAL LOW (ref 3.87–5.11)
RDW: 18.7 % — ABNORMAL HIGH (ref 11.5–15.5)
Smear Review: DECREASED
WBC: 0.4 10*3/uL — CL (ref 4.0–10.5)
nRBC: 0 % (ref 0.0–0.2)

## 2022-01-28 LAB — BPAM PLATELET PHERESIS
Blood Product Expiration Date: 202310232359
ISSUE DATE / TIME: 202310212034
Unit Type and Rh: 8400

## 2022-01-28 LAB — MAGNESIUM: Magnesium: 2.8 mg/dL — ABNORMAL HIGH (ref 1.7–2.4)

## 2022-01-28 LAB — HAPTOGLOBIN: Haptoglobin: 286 mg/dL (ref 42–346)

## 2022-01-28 LAB — PREPARE PLATELET PHERESIS: Unit division: 0

## 2022-01-28 LAB — PHOSPHORUS: Phosphorus: 4.4 mg/dL (ref 2.5–4.6)

## 2022-01-28 LAB — T3: T3, Total: 124 ng/dL (ref 71–180)

## 2022-01-28 MED ORDER — DEXTROSE 5 % IV SOLN: INTRAVENOUS | Status: DC

## 2022-01-28 MED ORDER — POTASSIUM CHLORIDE CRYS ER 20 MEQ PO TBCR
20.0000 meq | EXTENDED_RELEASE_TABLET | Freq: Once | ORAL | Status: AC
  Administered 2022-01-28: 20 meq via ORAL
  Filled 2022-01-28: qty 1

## 2022-01-28 MED ORDER — POTASSIUM CHLORIDE 10 MEQ/50ML IV SOLN
10.0000 meq | INTRAVENOUS | Status: AC
  Administered 2022-01-28 (×2): 10 meq via INTRAVENOUS
  Filled 2022-01-28: qty 50

## 2022-01-28 MED ORDER — SODIUM CHLORIDE 0.9% IV SOLUTION: Freq: Once | INTRAVENOUS | Status: AC

## 2022-01-28 NOTE — Progress Notes (Signed)
[place] Progress Note Patient Name: Katrina Hughes DOB: 02-13-1951 MRN: [id number]   Date of Service  01/28/2022  HPI/Events of Note  K is 3.3 and creatinine 2.48  eICU Interventions  20 meq oral K x 1 Recheck K at 930 am     Intervention Category Major Interventions: Electrolyte abnormality - evaluation and management  Margaretmary Lombard 01/28/2022, 5:44 AM

## 2022-01-28 NOTE — Evaluation (Signed)
Clinical/Bedside Swallow Evaluation Patient Details  Name: Katrina Hughes MRN: [id number] Date of Birth: [birth year]  Today's Date: 01/28/2022 Time: SLP Start Time (ACUTE ONLY): 4 SLP Stop Time (ACUTE ONLY): 102 SLP Time Calculation (min) (ACUTE ONLY): 26 min  Past Medical History:  Past Medical History:  Diagnosis Date   Anginal pain ([place])    Bell palsy    CHF (congestive heart failure) (HCC)    Chronic kidney disease    Complication of anesthesia    slow to wake up   Diabetes mellitus without complication (HCC)    Dyspnea    Environmental and seasonal allergies    GERD (gastroesophageal reflux disease)    Glaucoma    Hypertension    Hyperthyroidism    pt states that it's hypothyroidism   Myocardial infarction [facility])    Sleep apnea    Past Surgical History:  Past Surgical History:  Procedure Laterality Date   ABDOMINAL HYSTERECTOMY     APPENDECTOMY     BICEPT TENODESIS Left 06/10/2017   Procedure: BICEPS TENODESIS;  Surgeon: Leim Fabry, MD;  Location: [facility] ORS;  Service: Orthopedics;  Laterality: Left;   BREAST REDUCTION SURGERY     CATARACT EXTRACTION, BILATERAL Bilateral    CHOLECYSTECTOMY     CORONARY ANGIOPLASTY WITH STENT PLACEMENT     HERNIA REPAIR     NASAL HEMORRHAGE CONTROL N/A 09/17/2017   Procedure: EPISTAXIS CONTROL;  Surgeon: [name], MD;  Location: [place];  Service: ENT;  Laterality: N/A;   REVERSE SHOULDER ARTHROPLASTY Left 06/10/2017   Procedure: REVERSE SHOULDER ARTHROPLASTY;  Surgeon: Leim Fabry, MD;  Location: [facility] ORS;  Service: Orthopedics;  Laterality: Left;   REVERSE SHOULDER ARTHROPLASTY Right 11/03/2018   Procedure: REVERSE SHOULDER ARTHROPLASTY, BICEPS TENODESIS;  Surgeon: Leim Fabry, MD;  Location: [facility] ORS;  Service: Orthopedics;  Laterality: Right;   HPI:  71 year old female former smoker with OSA, DM2, HTN, CHF, hx cardiac stent, GERD, CKD, hyperthyroidism who presents for vague symptoms including malaise, chest pain, shortness  of breath, minimally productive cough and non-bloody diarrhea (3x daily) x 4 days. Family at bedside reports that they thought she developed a cold after traveling to [place] to visit family for a week. Has had poor PO intake. She had bloody gums and difficulty swallowing and was found to be pancytopenic.    Assessment / Plan / Recommendation  Clinical Impression  Pt presents with s/sx of both oropharyngeal dysphagia. Pt being treated for mucositis like symptoms (mouth sores, excess mucous: bloody and yellow tinged) suspected to be related to medication related side effect per discussion wtih nurse. Pt with significant odynophagia as expected and with pain in oral cavity at rest. During oral motor exam, excess bloody secretions noted in posterior oral cavity, suspect hypopharynx, suctioned some with yankauer. Provided gentle oral care (pt is receiving magic mouthwash and lidocaine swish and swallow for pain). Generalized weakness noted of oral musculature with poor bolus control (some anterior spillage with thins via cup), reduced bolus cohesion with delayed AP transit, and suspected delay in swallow initiation per palpation. Pt with immediate and delayed throat clearing with ice chips and overt coughing with thin liquids concerning for reduced airway protection. Recommend NPO with short term means of alternative nutrition and meds via alternative means. SLP to follow closely for PO readiness. Pt okay for single ice chips following oral care as tolerated for comfort and to improve oral hygiene, moistening.  SLP Visit Diagnosis: Dysphagia, unspecified (R13.10);Dysphagia, oral phase (R13.11)    Aspiration  Risk  Mild aspiration risk;Moderate aspiration risk    Diet Recommendation   NPO; single ice chips for comfort as tolerated following diligent oral care  Medication Administration: Via alternative means    Other  Recommendations Oral Care Recommendations: Oral care QID;Oral care prior to ice  chip/H20    Recommendations for follow up therapy are one component of a multi-disciplinary discharge planning process, led by the attending physician.  Recommendations may be updated based on patient status, additional functional criteria and insurance authorization.  Follow up Recommendations Other (comment) (TBD)      Assistance Recommended at Discharge    Functional Status Assessment Patient has had a recent decline in their functional status and/or demonstrates limited ability to make significant improvements in function in a reasonable and predictable amount of time  Frequency and Duration min 2x/week          Prognosis Prognosis for Safe Diet Advancement: Good Barriers to Reach Goals: Time post onset;Severity of deficits      Swallow Study   General Date of Onset: 01/26/22 HPI: 71 year old female former smoker with OSA, DM2, HTN, CHF, hx cardiac stent, GERD, CKD, hyperthyroidism who presents for vague symptoms including malaise, chest pain, shortness of breath, minimally productive cough and non-bloody diarrhea (3x daily) x 4 days. Family at bedside reports that they thought she developed a cold after traveling to [place] to visit family for a week. Has had poor PO intake. She had bloody gums and difficulty swallowing and was found to be pancytopenic. Type of Study: Bedside Swallow Evaluation Previous Swallow Assessment: none on file Diet Prior to this Study: Thin liquids Temperature Spikes Noted: No Respiratory Status: Nasal cannula History of Recent Intubation: No Behavior/Cognition: Lethargic/Drowsy;Requires cueing;Cooperative Oral Cavity Assessment: Excessive secretions;Other (comment) (bloody thicker secretions) Oral Care Completed by SLP: Yes Oral Cavity - Dentition: Missing dentition Vision: Functional for self-feeding Self-Feeding Abilities: Needs assist Patient Positioning: Upright in bed Baseline Vocal Quality: Low vocal intensity;Hoarse Volitional Cough:  Weak;Congested Volitional Swallow: Unable to elicit    Oral/Motor/Sensory Function Overall Oral Motor/Sensory Function: Generalized oral weakness   Ice Chips Ice chips: Impaired Presentation: Spoon Oral Phase Impairments: Reduced labial seal;Reduced lingual movement/coordination Oral Phase Functional Implications: Prolonged oral transit Pharyngeal Phase Impairments: Suspected delayed Swallow;Decreased hyoid-laryngeal movement;Multiple swallows;Throat Clearing - Delayed   Thin Liquid Thin Liquid: Impaired Presentation: Cup;Straw;Spoon Oral Phase Impairments: Reduced labial seal;Reduced lingual movement/coordination Oral Phase Functional Implications: Prolonged oral transit;Oral residue Pharyngeal  Phase Impairments: Suspected delayed Swallow;Decreased hyoid-laryngeal movement;Multiple swallows;Cough - Immediate;Cough - Delayed;Throat Clearing - Delayed    Nectar Thick Nectar Thick Liquid: Not tested   Honey Thick Honey Thick Liquid: Not tested   Puree Puree: Not tested   Solid     Solid: Not tested      Hayden Rasmussen MA, CCC-SLP Acute Rehabilitation Services   01/28/2022,1:57 PM

## 2022-01-28 NOTE — Progress Notes (Signed)
eLink Physician-Brief Progress Note Patient Name: Katrina Hughes DOB: [birth year] MRN: [id number]   Date of Service  01/28/2022  HPI/Events of Note  Pt started on Diltizem drip for Afib/ RVR. HR now NSR rate 40-50's, Dilt @ 5.  eICU Interventions  Ok to hold diltiazem drip and may give Lopressor prn for tachycardia Discussed with bedside RN     Intervention Category Intermediate Interventions: Arrhythmia - evaluation and management  Judd Lien 01/28/2022, 9:25 PM

## 2022-01-28 NOTE — Progress Notes (Signed)
NAME:  Katrina Hughes, MRN:  [id number], DOB:  [birth year], LOS: 2 ADMISSION DATE:  01/24/2022, CONSULTATION DATE:  01/26/22 REFERRING MD:  Ralene Bathe CHIEF COMPLAINT:  Chest pain, SOB   History of Present Illness:  Katrina Hughes is a 71 y.o. female who has a PMH as below including but not limited to HTN, CHF, DM. She presented to [facility] ED 10/19 with chest pain and dyspnea that began 4 days prior and persisted.   She had bloody gums and difficulty swallowing and was found to be pancytopenic   CBC noteable for WBC 1.7, Hgb 5.3, PLT <5. D-dimer 6.67, Fibrinogen 652. Peripheral smear was negative for schistocytes. CT head and CT chest/abd/pelv were negative for acute process.  Head CT negative CT chest/abdomen/pelvis negative   Pertinent  Medical History:  has Status post shoulder replacement; S/p reverse total shoulder arthroplasty; Pancytopenia ([place]); Chronic kidney disease; Thrombocytopenia ([place]); AKI (acute kidney injury) ([place]); and Mucositis on their problem list.  Significant Hospital Events: Including procedures, antibiotic start and stop dates in addition to other pertinent events   10/20 admit.>>2u PRBC and 1u platelets  10/20 bone marrow biopsy>>  No evidence of acute leukemia.  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 Afebrile. Weak cough   Objective:  Blood pressure 99/84, pulse (!) 139, temperature 98.7 F (37.1 C), temperature source Oral, resp. rate 20, height _0  (1.651 m), weight 75.4 kg, SpO2 93 %.        Intake/Output Summary (Last 24 hours) at 01/28/2022 0846 Last data filed at 01/28/2022 0600 Gross per 24 hour  Intake 1107.04 ml  Output 1600 ml  Net -492.96 ml    Filed Weights   01/27/22 0500 01/28/22 0500  Weight: 75.6 kg 75.4 kg    Examination: General: Elderly woman, lying in bed, no  distress Neuro: Awake, interactive, nonfocal HEENT: [place]/AT. Sclerae anicteric. EOMI. Bloody gums/lips, able to open mouth, bloody secretions, weak cough Cardiovascular: S1 S2 tacky, irregular Lungs: Clear to auscultation, no accessory muscle use Abdomen: BS x 4, soft, NT/ND.  Musculoskeletal: No gross deformities, no edema.  Skin: Intact, warm, no rashes.  Labs/imaging personally reviewed:  CT head 10/20 > neg. CT chest/abd/pelv > sigmoid diverticulosis.  EKG appears atrial fibrillation, irregular, right bundle branch block pattern  Labs show hyponatremia with rising sodium, hypokalemia, hyper chloremia, creatinine increased to 2.5, WBC dropped to 0.4, platelets slightly increased from 8-12 after 1 unit transfusion  normal B12 levels Folate low at 3.8  Resolved problems  Hypotension with lactic acidosis -Related to anemia and hypovolemia  Assessment & Plan:   Pancytopenia of unclear etiology - peripheral smear negative for schistocytes  -Bone marrow biopsy negative for leukemia, megaloblastic changes consistent with methotrexate -  - Maintain Hgb > 7. -Monitor platelets, transfuse 1 more unit in view of active bleeding from the mouth - F/u on ADAMTS13, doubt TTP  Atrial fibrillation/RVR -Use Lopressor 5 every 6 instead of carvedilol 12.5 twice daily  -Cardizem drip at max 15/hour -Not a candidate for anticoagulation -May add amio drip if remains in RVR  Chest pain - central, non-radiating. Seems intermittent but difficult to ascertain given poor history. She  took 1 aspirin a few days PTA.  Troponin negative -NTG patch   AKI on CKD -unclear baseline creatinine -  Follow BMP. Hypernatremia -start D5W  Hx HTN, MI, CHF. -  Holding meds until clarified and she can take oral  Hx DM - Per son, she has been off of insulin for a while now. -Expect sugars to rise on D5W - SSI if glucose consistently > 180.  Hx Hyperthyroidism. -TSH is very low, okay to hold methimazole for  a few days  Dysphagia -related to oral ulcers and bleeding with weak cough. We will obtain swallow evaluation. If unable, may need core track tomorrow   Best practice (evaluated daily):  Diet/type: full liquids  DVT prophylaxis: not indicated GI prophylaxis: N/A Lines: N/A Foley:  N/A Code Status:  full code Last date of multidisciplinary goals of care discussion: Step son updated at bedside daily  Labs   CBC: Recent Labs  Lab 01/31/2022 2319 01/26/22 0000 01/26/22 0051 01/26/22 0107 01/26/22 0605 01/26/22 1207 01/26/22 2005 01/27/22 0134 01/27/22 1016 01/28/22 0300  WBC 1.9*  --   --  1.7* 1.4*  --   --   --  0.6* 0.4*  NEUTROABS 1.1*  --   --   --  0.9*  --   --   --   --  0.0*  HGB 5.6*  --    < > 5.3* 5.7* 7.6* 6.7* 9.2* 8.9* 8.7*  HCT 18.2*  --    < > 17.6* 17.4* 22.3* 20.0* 26.5* 26.4* 26.1*  MCV 104.0*  --   --  103.5* 92.6  --   --   --  91.0 90.0  PLT <5* <5*  --  <5* 14*  --   --   --  8* 12*   < > = values in this interval not displayed.     Basic Metabolic Panel: Recent Labs  Lab 01/17/2022 2319 01/26/22 0051 01/26/22 0052 01/26/22 0107 01/26/22 0330 01/27/22 1016 01/28/22 0300  NA 143 142 141  --  143 150* 157*  K 4.7 5.1 5.1  --  4.2 3.8 3.3*  CL 113* 114*  --   --  115* 124* 128*  CO2 14*  --   --   --  17* 20* 21*  GLUCOSE 235* 227*  --   --  197* 156* 209*  BUN 66* 63*  --   --  67* 69* 73*  CREATININE 3.24* 3.10*  --   --  3.01* 2.28* 2.48*  CALCIUM 10.4*  --   --   --  9.7 10.3 11.4*  MG  --   --   --  2.5* 2.4  --  2.8*  PHOS  --   --   --  5.6* 5.8*  --  4.4    GFR: Estimated Creatinine Clearance: 21.2 mL/min (A) (by C-G formula based on SCr of 2.48 mg/dL (H)). Recent Labs  Lab 01/26/22 0015 01/26/22 0107 01/26/22 0605 01/26/22 2005 01/26/22 2340 01/27/22 0134 01/27/22 1016 01/28/22 0300  WBC  --  1.7* 1.4*  --   --   --  0.6* 0.4*  LATICACIDVEN 7.1*  --   --  1.5 2.5* 2.8*  --   --      Liver Function Tests: Recent Labs   Lab 02/03/2022 2319  AST 48*  ALT 35  ALKPHOS 71  BILITOT 1.8*  PROT 6.0*  ALBUMIN 2.4*    Recent Labs  Lab 01/26/22 0107  LIPASE 36    No results for input(s): "AMMONIA" in the last 168 hours.  ABG    Component Value Date/Time   HCO3 13.2 (L) 01/26/2022 0052   TCO2 14 (L) 01/26/2022  0052   ACIDBASEDEF 13.0 (H) 01/26/2022 0052   O2SAT 99 01/26/2022 0052     Coagulation Profile: Recent Labs  Lab 01/26/22 0000  INR 1.5*     Cardiac Enzymes: No results for input(s): "CKTOTAL", "CKMB", "CKMBINDEX", "TROPONINI" in the last 168 hours.  HbA1C: Hgb A1c MFr Bld  Date/Time Value Ref Range Status  01/26/2022 06:05 AM 4.6 (L) 4.8 - 5.6 % Final    Comment:    (NOTE) Pre diabetes:          5.7%-6.4%  Diabetes:              >6.4%  Glycemic control for   <7.0% adults with diabetes   11/03/2018 03:40 PM 7.6 (H) 4.8 - 5.6 % Final    Comment:    (NOTE) Pre diabetes:          5.7%-6.4% Diabetes:              >6.4% Glycemic control for   <7.0% adults with diabetes     CBG: Recent Labs  Lab 01/24/2022 2353 01/26/22 0329 01/26/22 0828 01/26/22 1924 01/26/22 2314  GLUCAP 207* 170* 119* 133* 143*     Critical care time: 32 min.   Kara Mead MD. Shade Flood. [place] Pulmonary & Critical care Pager : [phone]  If no response to pager , please call [phone] until 7 pm After 7:00 pm call Elink  [phone]   01/28/2022  01/28/2022, 8:46 AM

## 2022-01-29 ENCOUNTER — Inpatient Hospital Stay (HOSPITAL_COMMUNITY): Payer: No Typology Code available for payment source

## 2022-01-29 DIAGNOSIS — E43 Unspecified severe protein-calorie malnutrition: Secondary | ICD-10-CM | POA: Insufficient documentation

## 2022-01-29 LAB — BASIC METABOLIC PANEL
Anion gap: 8 (ref 5–15)
BUN: 90 mg/dL — ABNORMAL HIGH (ref 8–23)
CO2: 21 mmol/L — ABNORMAL LOW (ref 22–32)
Calcium: 11.5 mg/dL — ABNORMAL HIGH (ref 8.9–10.3)
Chloride: 127 mmol/L — ABNORMAL HIGH (ref 98–111)
Creatinine, Ser: 2.45 mg/dL — ABNORMAL HIGH (ref 0.44–1.00)
GFR, Estimated: 21 mL/min — ABNORMAL LOW (ref >60)
Glucose, Bld: 223 mg/dL — ABNORMAL HIGH (ref 70–99)
Potassium: 3.5 mmol/L (ref 3.5–5.1)
Sodium: 156 mmol/L — ABNORMAL HIGH (ref 135–145)

## 2022-01-29 LAB — GLUCOSE, CAPILLARY
Glucose-Capillary: 147 mg/dL — ABNORMAL HIGH (ref 70–99)
Glucose-Capillary: 127 mg/dL — ABNORMAL HIGH (ref 70–99)
Glucose-Capillary: 134 mg/dL — ABNORMAL HIGH (ref 70–99)
Glucose-Capillary: 126 mg/dL — ABNORMAL HIGH (ref 70–99)
Glucose-Capillary: 156 mg/dL — ABNORMAL HIGH (ref 70–99)
Glucose-Capillary: 171 mg/dL — ABNORMAL HIGH (ref 70–99)
Glucose-Capillary: 196 mg/dL — ABNORMAL HIGH (ref 70–99)
Glucose-Capillary: 192 mg/dL — ABNORMAL HIGH (ref 70–99)
Glucose-Capillary: 180 mg/dL — ABNORMAL HIGH (ref 70–99)
Glucose-Capillary: 145 mg/dL — ABNORMAL HIGH (ref 70–99)
Glucose-Capillary: 172 mg/dL — ABNORMAL HIGH (ref 70–99)
Glucose-Capillary: 161 mg/dL — ABNORMAL HIGH (ref 70–99)
Glucose-Capillary: 188 mg/dL — ABNORMAL HIGH (ref 70–99)
Glucose-Capillary: 174 mg/dL — ABNORMAL HIGH (ref 70–99)
Glucose-Capillary: 197 mg/dL — ABNORMAL HIGH (ref 70–99)
Glucose-Capillary: 171 mg/dL — ABNORMAL HIGH (ref 70–99)
Glucose-Capillary: 245 mg/dL — ABNORMAL HIGH (ref 70–99)

## 2022-01-29 LAB — PREPARE PLATELET PHERESIS: Unit division: 0

## 2022-01-29 LAB — MAGNESIUM
Magnesium: 3 mg/dL — ABNORMAL HIGH (ref 1.7–2.4)
Magnesium: 2.2 mg/dL (ref 1.7–2.4)

## 2022-01-29 LAB — PHOSPHORUS
Phosphorus: 4.2 mg/dL (ref 2.5–4.6)
Phosphorus: 3 mg/dL (ref 2.5–4.6)

## 2022-01-29 LAB — CBC WITH DIFFERENTIAL/PLATELET
Abs Immature Granulocytes: 0 10*3/uL (ref 0.00–0.07)
Basophils Absolute: 0 10*3/uL (ref 0.0–0.1)
Basophils Relative: 0 %
Eosinophils Absolute: 0 10*3/uL (ref 0.0–0.5)
Eosinophils Relative: 0 %
HCT: 23 % — ABNORMAL LOW (ref 36.0–46.0)
Hemoglobin: 7.6 g/dL — ABNORMAL LOW (ref 12.0–15.0)
Immature Granulocytes: 0 %
Lymphocytes Relative: 96 %
Lymphs Abs: 0.3 10*3/uL — ABNORMAL LOW (ref 0.7–4.0)
MCH: 30.3 pg (ref 26.0–34.0)
MCHC: 33 g/dL (ref 30.0–36.0)
MCV: 91.6 fL (ref 80.0–100.0)
Monocytes Absolute: 0 10*3/uL — ABNORMAL LOW (ref 0.1–1.0)
Monocytes Relative: 0 %
Neutro Abs: 0 10*3/uL — CL (ref 1.7–7.7)
Neutrophils Relative %: 4 %
Platelets: 10 10*3/uL — CL (ref 150–400)
RBC: 2.51 MIL/uL — ABNORMAL LOW (ref 3.87–5.11)
RDW: 18.3 % — ABNORMAL HIGH (ref 11.5–15.5)
WBC: 0.3 10*3/uL — CL (ref 4.0–10.5)
nRBC: 0 % (ref 0.0–0.2)

## 2022-01-29 LAB — BPAM PLATELET PHERESIS
Blood Product Expiration Date: 202310242359
ISSUE DATE / TIME: 202310221456
Unit Type and Rh: 6200

## 2022-01-29 MED ORDER — PHENYLEPHRINE HCL-NACL 20-0.9 MG/250ML-% IV SOLN
0.0000 ug/min | INTRAVENOUS | Status: DC
  Administered 2022-01-29: 20 ug/min via INTRAVENOUS
  Filled 2022-01-29: qty 250

## 2022-01-29 MED ORDER — METOPROLOL TARTRATE 5 MG/5ML IV SOLN: 5.0000 mg | Freq: Once | INTRAVENOUS | Status: AC

## 2022-01-29 MED ORDER — FOLIC ACID 1 MG PO TABS: 1.0000 mg | ORAL_TABLET | Freq: Every day | ORAL | Status: DC

## 2022-01-29 MED ORDER — INSULIN ASPART 100 UNIT/ML IJ SOLN: 0.0000 [IU] | Freq: Three times a day (TID) | INTRAMUSCULAR | Status: DC

## 2022-01-29 MED ORDER — DEXTROSE 5 % IV BOLUS
500.0000 mL | Freq: Once | INTRAVENOUS | Status: AC
  Administered 2022-01-29: 500 mL via INTRAVENOUS

## 2022-01-29 MED ORDER — DILTIAZEM HCL-DEXTROSE 125-5 MG/125ML-% IV SOLN (PREMIX)
5.0000 mg/h | INTRAVENOUS | Status: DC
  Filled 2022-01-29: qty 125

## 2022-01-29 MED ORDER — FREE WATER
200.0000 mL | Status: DC
  Administered 2022-01-29: 200 mL

## 2022-01-29 MED ORDER — TBO-FILGRASTIM 480 MCG/0.8ML ~~LOC~~ SOSY
480.0000 ug | PREFILLED_SYRINGE | Freq: Every day | SUBCUTANEOUS | Status: DC
  Administered 2022-01-29: 480 ug via SUBCUTANEOUS
  Filled 2022-01-29: qty 0.8

## 2022-01-29 MED ORDER — INSULIN ASPART 100 UNIT/ML IJ SOLN: 0.0000 [IU] | Freq: Every day | INTRAMUSCULAR | Status: DC

## 2022-01-29 MED ORDER — TIMOLOL MALEATE 0.5 % OP SOLN
1.0000 [drp] | Freq: Every day | OPHTHALMIC | Status: DC
  Administered 2022-01-29: 1 [drp] via OPHTHALMIC
  Filled 2022-01-29: qty 5

## 2022-01-29 MED ORDER — BRIMONIDINE TARTRATE 0.2 % OP SOLN
1.0000 [drp] | Freq: Two times a day (BID) | OPHTHALMIC | Status: DC
  Administered 2022-01-29: 1 [drp] via OPHTHALMIC
  Filled 2022-01-29: qty 5

## 2022-01-29 MED ORDER — LATANOPROST 0.005 % OP SOLN
1.0000 [drp] | Freq: Every day | OPHTHALMIC | Status: DC
  Filled 2022-01-29: qty 2.5

## 2022-01-29 MED ORDER — INSULIN ASPART 100 UNIT/ML IJ SOLN: 4.0000 [IU] | Freq: Three times a day (TID) | INTRAMUSCULAR | Status: DC

## 2022-01-29 MED ORDER — VITAL AF 1.2 CAL PO LIQD
1000.0000 mL | ORAL | Status: DC
  Administered 2022-01-29: 1000 mL

## 2022-01-30 LAB — GLUCOSE, CAPILLARY
Glucose-Capillary: 157 mg/dL — ABNORMAL HIGH (ref 70–99)
Glucose-Capillary: 160 mg/dL — ABNORMAL HIGH (ref 70–99)

## 2022-01-31 LAB — CULTURE, BLOOD (ROUTINE X 2)
Culture: NO GROWTH
Special Requests: ADEQUATE

## 2022-01-31 LAB — SURGICAL PATHOLOGY

## 2022-02-05 ENCOUNTER — Encounter (HOSPITAL_COMMUNITY): Payer: Self-pay

## 2022-02-07 NOTE — Death Summary Note (Signed)
DEATH SUMMARY   Patient Details  Name: Katrina Hughes MRN: [id number] DOB: Jun 02, 1950  Admission/Discharge Information   Admit Date:  2022-02-19  Date of Death: Date of Death: 02-23-2022  Time of Death: Time of Death: 17-Jun-2125  Length of Stay: 3  Referring Physician: Leafy Ro, MD   Reason(s) for Hospitalization  71 year old lady who was admitted with shortness of breath and chest pain that began about 4 days prior to presentation.  Diagnoses  Preliminary cause of death:  Respiratory failure Secondary Diagnoses (including complications and co-morbidities):  Principal Problem:   Pancytopenia ([place]) Active Problems:   Protein-calorie malnutrition, severe Acute kidney injury Chronic kidney disease Mucositis Atrial fibrillation with RVR Hypernatremia Hypertension Congestive heart failure Diabetes type 2 Coronary artery disease Hyperthyroidism  Brief Hospital Course (including significant findings, care, treatment, and services provided and events leading to death)  Katrina Hughes is Katrina 71 y.o. year old female who was brought to the hospital for chest discomfort and shortness of breath, central chest pain, nonradiating.  Cough productive of clear phlegm.  Few days of diarrhea.  Was noted to have Katrina bloody gum. 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  Was felt to be related to methotrexate toxicity-methotrexate was held. CT chest abdomen and pelvis was negative for any acute findings. Was transfused 2 units of packed red cells and 2 units of platelets. Noted to be in atrial fibrillation for which she was started on beta-blockade, Cardizem.  Patient developed increasing shortness of breath, started on BiPAP.  Unable to tolerate BiPAP.  With progressive deterioration, decision was made to transition to comfort measures as per patient  wishes. Patient succumbed to her illness at Jun 17, 2125 on February 23, 2022   Pertinent Labs and Studies  Significant Diagnostic Studies DG CHEST PORT 1 VIEW  Result Date: 02/23/2022 CLINICAL DATA:  Shortness of breath EXAM: PORTABLE CHEST 1 VIEW COMPARISON:  Chest radiograph 02/23/2022 FINDINGS: Right upper extremity PICC line tip projects over the superior vena cava. Enteric tube courses inferior to the diaphragm. Stable cardiac and mediastinal contours. Aortic tortuosity. Low lung volume exam. Basilar heterogeneous opacities. No pleural effusion or pneumothorax. Bilateral reverse shoulder arthroplasties. IMPRESSION: Low lung volumes with basilar atelectasis. Electronically Signed   By: Lovey Newcomer M.D.   On: 02-23-22 16:45   DG Abd Portable 1V  Result Date: 02/23/22 CLINICAL DATA:  Feeding tube placement EXAM: PORTABLE ABDOMEN - 1 VIEW COMPARISON:  None Available. FINDINGS: Nonobstructive bowel-gas pattern. Partially visualized enteric feeding tube with tip projecting over the expected area of the stomach. Moderate degenerative changes of the lumbar spine with associated levocurvature. IMPRESSION: Feeding tube tip in the stomach. Electronically Signed   By: Yetta Glassman M.D.   On: Feb 23, 2022 10:18   DG Chest Port 1 View  Result Date: 2022-02-23 CLINICAL DATA:  17-Jun-5624 with acute respiratory failure. EXAM: PORTABLE CHEST 1 VIEW COMPARISON:  Portable chest Feb 19, 2022, chest CT 01/26/2022 FINDINGS: 4:45 Katrina.m. Interval new right PICC terminating at about the superior cavoatrial junction. No pneumothorax. The lungs expiratory with increased perihilar atelectatic bands. No focal pneumonia is seen but evaluation of the bases limited. The cardiac size is normal. The mediastinum is stable allowing for exploration, with aortic tortuosity and calcification. There is osteopenia, bilateral reverse shoulder arthroplasties and slight thoracic levoscoliosis. Multiple overlying monitor wires. IMPRESSION: No acute  radiographic chest findings, but limited visualization of the bases due to expiratory study. Right PICC.  Electronically Signed   By: Telford Nab M.D.   On: 21-Feb-2022 06:34   [place] EKG SITE RITE  Result Date: 01/27/2022 If Site Rite image not attached, placement could not be confirmed due to current cardiac rhythm.  CT BONE MARROW BIOPSY  Result Date: 01/26/2022 EXAM: CT BIOPSY BONE MARROW MEDICATIONS: None. ANESTHESIA/SEDATION: Moderate (conscious) sedation was employed during this procedure. Katrina total of Versed 1 mg and Fentanyl 25 mcg was administered intravenously. Moderate Sedation Time: 11 minutes. The patient's level of consciousness and vital signs were monitored continuously by radiology nursing throughout the procedure under my direct supervision. FLUOROSCOPY TIME:  N/Katrina COMPLICATIONS: None immediate. PROCEDURE: Informed written consent was obtained from the patient after Katrina thorough discussion of the procedural risks, benefits and alternatives. All questions were addressed. Maximal Sterile Barrier Technique was utilized including caps, mask, sterile gowns, sterile gloves, sterile drape, hand hygiene and skin antiseptic. Katrina timeout was performed prior to the initiation of the procedure. The patient was placed prone on the CT exam table. Limited CT of the pelvis was performed for planning purposes. Skin entry site was marked, and the overlying skin was prepped and draped in the standard sterile fashion. Local analgesia was obtained with 1% lidocaine. Using CT guidance, an 11 gauge needle was advanced just deep to the cortex of the right posterior ilium. Subsequently, bone marrow aspiration and core biopsy were performed. Specimens were submitted to lab/pathology for handling. Hemostasis was achieved with manual pressure, and Katrina clean dressing was placed. The patient tolerated the procedure well without immediate complication. IMPRESSION: Successful CT-guided bone marrow aspiration and core biopsy of the  right posterior ilium. Electronically Signed   By: Albin Felling M.D.   On: 01/26/2022 12:19   CT CHEST ABDOMEN PELVIS WO CONTRAST  Result Date: 01/26/2022 CLINICAL DATA:  Sepsis. EXAM: CT CHEST, ABDOMEN AND PELVIS WITHOUT CONTRAST TECHNIQUE: Multidetector CT imaging of the chest, abdomen and pelvis was performed following the standard protocol without IV contrast. RADIATION DOSE REDUCTION: This exam was performed according to the departmental dose-optimization program which includes automated exposure control, adjustment of the mA and/or kV according to patient size and/or use of iterative reconstruction technique. COMPARISON:  Chest radiograph dated 01/14/2022. FINDINGS: Evaluation of this exam is limited in the absence of intravenous contrast. CT CHEST FINDINGS Cardiovascular: There is no cardiomegaly or pericardial effusion. There is coronary vascular calcification. Mild atherosclerotic calcification of the thoracic aorta. No aneurysmal dilatation. The central pulmonary arteries are grossly unremarkable. Mediastinum/Nodes: No hilar or mediastinal adenopathy. The esophagus is grossly unremarkable. Lobulated appearance of the thyroid gland with rim calcified inferior isthmic nodule measuring up to 2 cm. Recommend thyroid US (ref: J Am Coll Radiol. 2015 Feb;12(2): 143-50).No mediastinal fluid collection. Lungs/Pleura: Bibasilar subpleural atelectasis and areas of linear scarring. No focal consolidation, pleural effusion, or pneumothorax. The central airways are patent. Musculoskeletal: Bilateral shoulder arthroplasties. No acute osseous pathology. CT ABDOMEN PELVIS FINDINGS No intra-abdominal free air or free fluid. Hepatobiliary: The liver is grossly unremarkable. There is sludge within the gallbladder. No pericholecystic fluid. Pancreas: Unremarkable. No pancreatic ductal dilatation or surrounding inflammatory changes. Spleen: Normal in size without focal abnormality. Adrenals/Urinary Tract: Bilateral  adrenal thickening or hyperplasia. There is no hydronephrosis or nephrolithiasis on either side. Small bilateral renal cysts and additional hypodense lesions which are too small to characterize. This can be better evaluated with ultrasound on Katrina nonemergent/outpatient basis. The visualized ureters and urinary bladder appear unremarkable. Stomach/Bowel: There is sigmoid diverticulosis without active inflammatory changes. There is  no bowel obstruction or active inflammation. The appendix is not visualized with certainty. No inflammatory changes identified in the right lower quadrant. Vascular/Lymphatic: Mild aortoiliac atherosclerotic disease. The IVC is unremarkable. No portal venous gas. There is no adenopathy. Reproductive: Hysterectomy.  No adnexal masses. Other: Ventral hernia repair with mesh. Multiple scattered surgical clips in the abdomen. Musculoskeletal: Degenerative changes of the spine. No acute osseous pathology. IMPRESSION: 1. No acute intrathoracic, abdominal, or pelvic pathology. 2. Sigmoid diverticulosis. No bowel obstruction. 3. Small bilateral renal cysts and additional hypodense lesions which are too small to characterize. 4.  Aortic Atherosclerosis (ICD10-I70.0). Electronically Signed   By: Anner Crete M.D.   On: 01/26/2022 01:40   CT Head Wo Contrast  Result Date: 01/26/2022 CLINICAL DATA:  Altered mental status. EXAM: CT HEAD WITHOUT CONTRAST TECHNIQUE: Contiguous axial images were obtained from the base of the skull through the vertex without intravenous contrast. RADIATION DOSE REDUCTION: This exam was performed according to the departmental dose-optimization program which includes automated exposure control, adjustment of the mA and/or kV according to patient size and/or use of iterative reconstruction technique. COMPARISON:  None Available. FINDINGS: Brain: Mild age-related atrophy and chronic microvascular ischemic changes. Small scattered periventricular and deep white matter  hypodense foci may represent chronic microvascular ischemic changes, old lacunar infarcts, or white matter disease. There is no acute intracranial hemorrhage. No mass effect or midline shift. No extra-axial fluid collection. Vascular: No hyperdense vessel or unexpected calcification. Skull: Normal. Negative for fracture or focal lesion. Sinuses/Orbits: No acute finding. Other: None IMPRESSION: 1. No acute intracranial pathology. 2. Mild age-related atrophy and chronic microvascular ischemic changes. Electronically Signed   By: Anner Crete M.D.   On: 01/26/2022 01:08   DG CHEST PORT 1 VIEW  Result Date: 01/07/2022 CLINICAL DATA:  Shortness of breath. EXAM: PORTABLE CHEST 1 VIEW COMPARISON:  Chest radiograph dated 09/17/2017. FINDINGS: No focal consolidation, pleural effusion, pneumothorax. The cardiac silhouette is within normal limits. No acute osseous pathology. Bilateral shoulder arthroplasty. IMPRESSION: No active disease. Electronically Signed   By: Anner Crete M.D.   On: 01/13/2022 23:48    Microbiology Recent Results (from the past 240 hour(s))  Resp Panel by RT-PCR (Flu Katrina&B, Covid) Anterior Nasal Swab     Status: None   Collection Time: 01/21/2022 11:13 PM   Specimen: Anterior Nasal Swab  Result Value Ref Range Status   SARS Coronavirus 2 by RT PCR NEGATIVE NEGATIVE Final    Comment: (NOTE) SARS-CoV-2 target nucleic acids are NOT DETECTED.  The SARS-CoV-2 RNA is generally detectable in upper respiratory specimens during the acute phase of infection. The lowest concentration of SARS-CoV-2 viral copies this assay can detect is 138 copies/mL. Katrina negative result does not preclude SARS-Cov-2 infection and should not be used as the sole basis for treatment or other patient management decisions. Katrina negative result may occur with  improper specimen collection/handling, submission of specimen other than nasopharyngeal swab, presence of viral mutation(s) within the areas targeted by this  assay, and inadequate number of viral copies(<138 copies/mL). Katrina negative result must be combined with clinical observations, patient history, and epidemiological information. The expected result is Negative.  Fact Sheet for Patients:  EntrepreneurPulse.com.au  Fact Sheet for Healthcare Providers:  IncredibleEmployment.be  This test is no t yet approved or cleared by the [place] FDA and  has been authorized for detection and/or diagnosis of SARS-CoV-2 by FDA under an Emergency Use Authorization (EUA). This EUA will remain  in effect (meaning this test can  be used) for the duration of the COVID-19 declaration under Section 564(b)(1) of the Act, 21 U.S.C.section 360bbb-3(b)(1), unless the authorization is terminated  or revoked sooner.       Influenza Katrina by PCR NEGATIVE NEGATIVE Final   Influenza B by PCR NEGATIVE NEGATIVE Final    Comment: (NOTE) The Xpert Xpress SARS-CoV-2/FLU/RSV plus assay is intended as an aid in the diagnosis of influenza from Nasopharyngeal swab specimens and should not be used as Katrina sole basis for treatment. Nasal washings and aspirates are unacceptable for Xpert Xpress SARS-CoV-2/FLU/RSV testing.  Fact Sheet for Patients: EntrepreneurPulse.com.au  Fact Sheet for Healthcare Providers: IncredibleEmployment.be  This test is not yet approved or cleared by the [place] FDA and has been authorized for detection and/or diagnosis of SARS-CoV-2 by FDA under an Emergency Use Authorization (EUA). This EUA will remain in effect (meaning this test can be used) for the duration of the COVID-19 declaration under Section 564(b)(1) of the Act, 21 U.S.C. section 360bbb-3(b)(1), unless the authorization is terminated or revoked.  Performed at [facility], [place] [address], [place], [place] [address]   Culture, blood (routine x 2)     Status: None   Collection Time: 01/26/22  1:06 AM    Specimen: BLOOD RIGHT ARM  Result Value Ref Range Status   Specimen Description BLOOD RIGHT ARM  Final   Special Requests   Final    BOTTLES DRAWN AEROBIC AND ANAEROBIC Blood Culture adequate volume   Culture   Final    NO GROWTH 5 DAYS Performed at [facility], [address] [place], [place] [address]    Report Status 01/31/2022 FINAL  Final  Respiratory (~20 pathogens) panel by PCR     Status: None   Collection Time: 01/26/22  3:23 AM   Specimen: Nasopharyngeal Swab; Respiratory  Result Value Ref Range Status   Adenovirus NOT DETECTED NOT DETECTED Final   Coronavirus 229E NOT DETECTED NOT DETECTED Final    Comment: (NOTE) The Coronavirus on the Respiratory Panel, DOES NOT test for the novel  Coronavirus (2019 nCoV)    Coronavirus HKU1 NOT DETECTED NOT DETECTED Final   Coronavirus NL63 NOT DETECTED NOT DETECTED Final   Coronavirus OC43 NOT DETECTED NOT DETECTED Final   Metapneumovirus NOT DETECTED NOT DETECTED Final   Rhinovirus / Enterovirus NOT DETECTED NOT DETECTED Final   Influenza Katrina NOT DETECTED NOT DETECTED Final   Influenza B NOT DETECTED NOT DETECTED Final   Parainfluenza Virus 1 NOT DETECTED NOT DETECTED Final   Parainfluenza Virus 2 NOT DETECTED NOT DETECTED Final   Parainfluenza Virus 3 NOT DETECTED NOT DETECTED Final   Parainfluenza Virus 4 NOT DETECTED NOT DETECTED Final   Respiratory Syncytial Virus NOT DETECTED NOT DETECTED Final   Bordetella pertussis NOT DETECTED NOT DETECTED Final   Bordetella Parapertussis NOT DETECTED NOT DETECTED Final   Chlamydophila pneumoniae NOT DETECTED NOT DETECTED Final   Mycoplasma pneumoniae NOT DETECTED NOT DETECTED Final    Comment: Performed at [facility], [place]. [address] [place], [place] [address]  MRSA Next Gen by PCR, Nasal     Status: None   Collection Time: 01/26/22  6:57 PM   Specimen: Nasal Mucosa; Nasal Swab  Result Value Ref Range Status   MRSA by PCR Next Gen NOT DETECTED NOT DETECTED Final     Comment: (NOTE) The GeneXpert MRSA Assay (FDA approved for NASAL specimens only), is one component of Katrina comprehensive MRSA colonization surveillance program. It is  not intended to diagnose MRSA infection nor to guide or monitor treatment for MRSA infections. Test performance is not FDA approved in patients less than 62 years old. Performed at [facility], [place] [address] [place], [place] [address]     Lab Basic Metabolic Panel: Recent Labs  Lab 01/28/22 0300 01/28/22 1752 22-Feb-2022 0314 02/22/2022 1755  NA 157* 158* 156*  --   K 3.3* 3.7 3.5  --   CL 128* 129* 127*  --   CO2 21* 21* 21*  --   GLUCOSE 209* 231* 223*  --   BUN 73* 81* 90*  --   CREATININE 2.48* 2.35* 2.45*  --   CALCIUM 11.4* 11.7* 11.5*  --   MG 2.8*  --  3.0* 2.2  PHOS 4.4  --  4.2 3.0   Liver Function Tests: No results for input(s): "AST", "ALT", "ALKPHOS", "BILITOT", "PROT", "ALBUMIN" in the last 168 hours. No results for input(s): "LIPASE", "AMYLASE" in the last 168 hours. No results for input(s): "AMMONIA" in the last 168 hours. CBC: Recent Labs  Lab 01/28/22 0300 02-22-2022 0314  WBC 0.4* 0.3*  NEUTROABS 0.0* 0.0*  HGB 8.7* 7.6*  HCT 26.1* 23.0*  MCV 90.0 91.6  PLT 12* 10*   Cardiac Enzymes: No results for input(s): "CKTOTAL", "CKMB", "CKMBINDEX", "TROPONINI" in the last 168 hours. Sepsis Labs: Recent Labs  Lab 01/28/22 0300 22-Feb-2022 0314  WBC 0.4* 0.3*    Procedures/Operations     Katrina Hughes Katrina Hughes 02/03/2022, 12:12 PM

## 2022-02-07 NOTE — Progress Notes (Signed)
Acute respiratory failure requiring BiPAP management

## 2022-02-07 NOTE — Progress Notes (Addendum)
Initial Nutrition Assessment  DOCUMENTATION CODES:   Severe malnutrition in context of acute illness/injury  INTERVENTION:  Start tube feeding via Cortrak today: -Initiate Vital AF 1.2 at 15 mL/hour and advance by 10 mL/hour every 8 hours to goal of 65 mL/hour x 24 hours daily (1560 mL goal daily volume). -Goal regimen provides 1872 kcal, 117 grams of protein, 1264 mL H2O daily.  Monitor magnesium, potassium, and phosphorus BID for at least 3 days, MD to replete as needed, as pt is at risk for refeeding syndrome.  Continue folic acid 1 mg daily. Change to providing per tube per team discretion now that Cortrak is in place.  NUTRITION DIAGNOSIS:   Severe Malnutrition related to acute illness (pancytopenia, mouth sores, bleeding, dysphagia) as evidenced by energy intake < or equal to 50% for > or equal to 5 days, moderate fat depletion, moderate muscle depletion, 16.4% weight loss over the past 6 weeks per family report.  GOAL:   Patient will meet greater than or equal to 90% of their needs  MONITOR:   Diet advancement, Labs, Weight trends, TF tolerance, Skin, I & O's  REASON FOR ASSESSMENT:   Consult Enteral/tube feeding initiation and management  ASSESSMENT:   71 year old female with PMHx of HTN, CHF, DM, hypothyroidism who [facility] with chest pain and dyspnea that began 4 days prior and persisted, also with bloody gums and difficulty swallowing and was found to be pancytopenic. Also found to have AKI and hypernatremia.  -Per chart pancytopenia presumed to be related to methotrexate based on megaloblastic changes noted on bone marrow biopsy.  01/27/22: ordered for full liquid diet 01/28/22: assessed by SLP with recommendation for NPO status  Met with patient and her Nephew at bedside, who is POA. Patient sleeping at time of assessment and history provided by Nephew. Prior to 6 weeks ago pt had normal appetite and intake. She would eat 2-3 meals per day. For  breakfast she would have oatmeal, eggs, grits. For lunch she would have a ham sandwich or grilled cheese sandwich. For dinner she would have chicken or another meat with sides such as mashed potatoes with gravy and vegetables. Over the past 6 weeks pt began eating less at meals and eating more softer foods. Over the past 2 weeks she has only been eating the broth (chicken or vegetable) from soups she would heat up to eat. She also developed mouth sores, bloody gums and difficulty swallowing. Since admission pt has only had 15 mL broth, 3/4 of an apple juice, and ice chips.   Nephew reports UBW was 200-205 lbs (90.9-93.2 kg) and that she has lost at least 25 lbs in the past 6 weeks. No recent wt history in chart. Pt documented to be 112 kg on 11/03/2018, 112 kg 10/13/2018, and 92.5-95.7 kg in 2019. Current wt is 75.9 kg (167.33 lbs). With reported UBW 200 lbs, pt has lost 32.7 lbs (16.4% body weight) over the past 6 weeks per Nephew's report, which is significant for time frame. Even if pt only lost 25 lbs (13% body weight), this would also be significant for time frame.  Enteral Access: Cortrak placed 02/09/22 in right nare secured at 70 cm with bridle; tube tip in stomach per abdominal x-ray 02-09-2022  UOP: 1150 mL or 0.6 mL/kg/hr  I/O: +1,799.3 mL since admission  Medications reviewed and include: folic acid 1 mg daily IV, Novolog 0-9 units Q4hrs, D5 at 75 mL/hour.  Labs reviewed: CBG 161-245 past  24 hrs, Sodium 156, Chloride 127, CO2 21, BUN 90 (trending up), Creatinine 2.45 (trending up), WBC 0.3, RBC 2.51, Hemoglobin 7.6, HCT 23, Platelets 10. Vitamin B12: 725 WNL 01/26/22 Folate: 3.8 L 01/26/22 Ferritin: 2521 H 01/26/22 No CRP ordered  Discussed with RN. Plan is to start enteral nutrition via Cortrak tube today.  NUTRITION - FOCUSED PHYSICAL EXAM:  Flowsheet Row Most Recent Value  Orbital Region Moderate depletion  Upper Arm Region No depletion  Thoracic and Lumbar Region No depletion   Buccal Region Moderate depletion  Temple Region Moderate depletion  Clavicle Bone Region Moderate depletion  Clavicle and Acromion Bone Region Moderate depletion  Scapular Bone Region Unable to assess  Dorsal Hand Mild depletion  Patellar Region Mild depletion  Anterior Thigh Region Mild depletion  Posterior Calf Region Moderate depletion  Edema (RD Assessment) Mild  Hair Reviewed  Eyes Unable to assess  Mouth Reviewed  [sores and dried blood]  Skin Reviewed  Nails Reviewed       Diet Order:   Diet Order             Diet NPO time specified Except for: Ice Chips  Diet effective now                   EDUCATION NEEDS:   Not appropriate for education at this time  Skin:  Skin Assessment: Skin Integrity Issues: Skin Integrity Issues:: Incisions Incisions: closed incision to lower sacrum  Last BM:  01/28/22 per chart (no BM characteristics)  Height:   Ht Readings from Last 1 Encounters:  01/27/22 $RemoveB'5\' 5"'[id number]$  (1.651 m)    Weight:   Wt Readings from Last 1 Encounters:  28-Feb-2022 75.9 kg   Ideal Body Weight:  56.8 kg  BMI:  Body mass index is 27.85 kg/m.  Estimated Nutritional Needs:   Kcal:  1850-2050  Protein:  100-115 grams  Fluid:  1850-2050 mL/day  Loanne Drilling, MS, RD, LDN, CNSC Pager number available on Amion

## 2022-02-07 NOTE — Procedures (Signed)
Cortrak  Tube Type:  Cortrak - 43 inches Tube Location:  Right nare Initial Placement:  Stomach Secured by: Bridle Technique Used to Measure Tube Placement:  Marking at nare/corner of mouth Cortrak Secured At:  70 cm   Cortrak Tube Team Note:  Consult received to place a Cortrak feeding tube.   X-ray is required, abdominal x-ray has been ordered by the Cortrak team. Please confirm tube placement before using the Cortrak tube.   If the tube becomes dislodged please keep the tube and contact the Cortrak team at [email or web address] for replacement.  If after hours and replacement cannot be delayed, place a NG tube and confirm placement with an abdominal x-ray.    Koleen Distance MS, RD, LDN Please refer to [facility] for RD and/or RD on-call/weekend/after hours pager

## 2022-02-07 NOTE — Progress Notes (Signed)
eLink Physician-Brief Progress Note Patient Name: Katrina Hughes DOB: 1950-07-08 MRN: [id number]   Date of Service  02/10/2022  HPI/Events of Note  Called as HR going up to the 140s. Cardizem ordered but couldn't be started due to hypotension.  On camera evaluation, HR down to 106-114, but BP 64/46  eICU Interventions  Will start phenylephrine to target SBP >90, MAP >65 D/c cardizem.  If HR back up to the 140s, would consider starting amiodarone instead.         Olivia Lopez de Gutierrez 02/10/22, 8:39 PM

## 2022-02-07 NOTE — Progress Notes (Signed)
Pt went asystole on the monitor. Pt pronounced dead at 2125-06-27 by Erma Heritage, RN and Windy Canny, RN.  Elink RN - June notified of TOD.

## 2022-02-07 NOTE — Progress Notes (Addendum)
Pt becoming increasingly bradycardiac with Apnea alarms while on BiPap. Family/HCPOA is aware patient is actively progressing closer to death and would like BiPap to be taken off and her to be placed on [place] for comfort.   RN contacted [facility] for [place] orders and family agrees that they would like to focus on comfort at this time.

## 2022-02-07 NOTE — Progress Notes (Signed)
Type of heart failure is clinically undetermined

## 2022-02-07 NOTE — Progress Notes (Signed)
NAME:  Katrina Hughes, MRN:  [id number], DOB:  May 21, 1950, LOS: 3 ADMISSION DATE:  02/06/2022, CONSULTATION DATE:  01/26/22 REFERRING MD:  Ralene Bathe CHIEF COMPLAINT:  Chest pain, SOB   History of Present Illness:  Katrina Hughes is a 71 y.o. female who has a PMH as below including but not limited to HTN, CHF, DM. She presented to [facility] ED 10/19 with chest pain and dyspnea that began 4 days prior and persisted.   She had bloody gums and difficulty swallowing and was found to be pancytopenic   CBC noteable for WBC 1.7, Hgb 5.3, PLT <5. D-dimer 6.67, Fibrinogen 652. Peripheral smear was negative for schistocytes. CT head and CT chest/abd/pelv were negative for acute process.  Head CT negative CT chest/abdomen/pelvis negative  Pertinent  Medical History:  has Status post shoulder replacement; S/p reverse total shoulder arthroplasty; Pancytopenia ([place]); Chronic kidney disease; Thrombocytopenia ([place]); AKI (acute kidney injury) ([place]); and Mucositis on their problem list.  Significant Hospital Events: Including procedures, antibiotic start and stop dates in addition to other pertinent events   10/20 admit.>>2u PRBC and 1u platelets  10/20 bone marrow biopsy>>  No evidence of acute leukemia.  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 rate (!) 23, height _0  (1.651 m), weight 75.9 kg, SpO2 95 %.        Intake/Output Summary (Last 24 hours) at 01/30/2022 0835 Last data filed at 30-Jan-2022 0600 Gross per 24 hour  Intake 1920.75 ml  Output 950 ml  Net 970.75 ml   Filed Weights   01/27/22 0500 01/28/22 0500  30-Jan-2022 0600  Weight: 75.6 kg 75.4 kg 75.9 kg    Examination: General: Elderly, chronically ill-appearing, does not appear to be in acute distress Neuro: Easily arousable and interactive HEENT: Mucositis Cardiovascular: S1-S2 appreciated, irregularly irregular Lungs: Clear breath sounds Abdomen: Bowel sounds appreciated Musculoskeletal: Moving all extremities Skin: Warm and dry  Labs/imaging personally reviewed:  CT head 10/20 > neg. CT chest/abd/pelv-sigmoid diverticulosis  EKG appears A-fib, right bundle branch block  Labs with hypernatremia, persistent pancytopenia  Assessment & Plan:   Pancytopenia -Presumption is that it is related to methotrexate based on megaloblastic changes noted on bone marrow biopsy -ADAMTS 13 pending -Started on [place] -Continue to monitor platelets and will transfuse if bleeding  Atrial fibrillation with RVR -Lopressor IV as needed -Not a candidate for anticoagulation -Consider amiodarone if RVR persists  AKI on chronic kidney disease -Follow Bmp -Maintain renal perfusion -  Hypernatremia -D5 water -On 75 cc D5 water  Hypertension, myocardial infarction, congestive heart failure -Hold antihypertensives tomorrow stable  History of diabetes -SSI  History of hypothyroidism -Hold methimazole at present  Dysphagia -Still with significant discomfort -Discussed placing cortrak for nutrition  Best practice (evaluated daily):  Diet/type: full liquids  DVT prophylaxis: not indicated GI prophylaxis: N/A Lines: N/A Foley:  N/A Code Status:  full code Last date of multidisciplinary goals of care discussion: Discussed with patient's nephew at bedside -Patient absolutely will not want chest compressions, does not want to be intubated, if having dysrhythmia will be okay with shock  Labs   CBC: Recent Labs  Lab 01/21/2022 2319 01/26/22 0000 01/26/22 0107 01/26/22 2841 01/26/22 1207  01/26/22 2005 01/27/22 0134 01/27/22 1016  01/28/22 0300 05-Feb-2022 0314  WBC 1.9*  --  1.7* 1.4*  --   --   --  0.6* 0.4* 0.3*  NEUTROABS 1.1*  --   --  0.9*  --   --   --   --  0.0* 0.0*  HGB 5.6*   < > 5.3* 5.7*   < > 6.7* 9.2* 8.9* 8.7* 7.6*  HCT 18.2*   < > 17.6* 17.4*   < > 20.0* 26.5* 26.4* 26.1* 23.0*  MCV 104.0*  --  103.5* 92.6  --   --   --  91.0 90.0 91.6  PLT <5*   < > <5* 14*  --   --   --  8* 12* 10*   < > = values in this interval not displayed.    Basic Metabolic Panel: Recent Labs  Lab 01/26/22 0107 01/26/22 0330 01/27/22 1016 01/28/22 0300 01/28/22 1752 02-05-22 0314  NA  --  143 150* 157* 158* 156*  K  --  4.2 3.8 3.3* 3.7 3.5  CL  --  115* 124* 128* 129* 127*  CO2  --  17* 20* 21* 21* 21*  GLUCOSE  --  197* 156* 209* 231* 223*  BUN  --  67* 69* 73* 81* 90*  CREATININE  --  3.01* 2.28* 2.48* 2.35* 2.45*  CALCIUM  --  9.7 10.3 11.4* 11.7* 11.5*  MG 2.5* 2.4  --  2.8*  --  3.0*  PHOS 5.6* 5.8*  --  4.4  --  4.2   GFR: Estimated Creatinine Clearance: 21.5 mL/min (A) (by C-G formula based on SCr of 2.45 mg/dL (H)). Recent Labs  Lab 01/26/22 0015 01/26/22 0107 01/26/22 0605 01/26/22 2005 01/26/22 2340 01/27/22 0134 01/27/22 1016 01/28/22 0300 2022/02/05 0314  WBC  --    < > 1.4*  --   --   --  0.6* 0.4* 0.3*  LATICACIDVEN 7.1*  --   --  1.5 2.5* 2.8*  --   --   --    < > = values in this interval not displayed.    Liver Function Tests: Recent Labs  Lab 01/24/2022 2319  AST 48*  ALT 35  ALKPHOS 71  BILITOT 1.8*  PROT 6.0*  ALBUMIN 2.4*   Recent Labs  Lab 01/26/22 0107  LIPASE 36   No results for input(s): "AMMONIA" in the last 168 hours.  ABG    Component Value Date/Time   HCO3 13.2 (L) 01/26/2022 0052   TCO2 14 (L) 01/26/2022 0052   ACIDBASEDEF 13.0 (H) 01/26/2022 0052   O2SAT 99 01/26/2022 0052     Coagulation Profile: Recent Labs  Lab 01/26/22 0000  INR 1.5*    Cardiac Enzymes: No results for input(s): "CKTOTAL", "CKMB", "CKMBINDEX", "TROPONINI" in the last 168  hours.  HbA1C: Hgb A1c MFr Bld  Date/Time Value Ref Range Status  01/26/2022 06:05 AM 4.6 (L) 4.8 - 5.6 % Final    Comment:    (NOTE) Pre diabetes:          5.7%-6.4%  Diabetes:              >6.4%  Glycemic control for   <7.0% adults with diabetes   11/03/2018 03:40 PM 7.6 (H) 4.8 - 5.6 % Final    Comment:    (NOTE) Pre diabetes:          5.7%-6.4% Diabetes:              >  6.4% Glycemic control for   <7.0% adults with diabetes     CBG: Recent Labs  Lab 01/28/22 1553 01/28/22 1937 01/28/22 2325 Jan 30, 2022 0323 01-30-2022 0744  GLUCAP 161* 188* 174* 197* 171*   The patient is critically ill with multiple organ systems failure and requires high complexity decision making for assessment and support, frequent evaluation and titration of therapies, application of advanced monitoring technologies and extensive interpretation of multiple databases. Critical Care Time devoted to patient care services described in this note independent of APP/resident time (if applicable)  is 33 minutes.   Sherrilyn Rist MD [facility] Personal pager: See Amion If unanswered, please page CCM On-call: [phone]

## 2022-02-07 NOTE — Progress Notes (Signed)
Chaplain responded to page for support of family at time of death.  Upon entering the room, family member at bedside asked for private time. Chaplain stepped away, waiting outside pt's room for indication family desired Chaplain support.  After some time Chaplain and pt's nurse agreed the family appeared to not need support at this time.  Chaplain on standby should family ask for spiritual support.  [place]

## 2022-02-07 NOTE — Progress Notes (Signed)
Hematology  Labs reviewed.     Latest Ref Rng & Units 02-26-2022    3:14 AM 01/28/2022    3:00 AM 01/27/2022   10:16 AM  CBC  WBC 4.0 - 10.5 K/uL 0.3  0.4  0.6   Hemoglobin 12.0 - 15.0 g/dL 7.6  8.7  8.9   Hematocrit 36.0 - 46.0 % 23.0  26.1  26.4   Platelets 150 - 400 K/uL '10  12  8    '$ Persistent pancytopenia: We will start the patient on chronic since the white count is not coming up. 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

## 2022-02-07 NOTE — Progress Notes (Signed)
Inpatient Diabetes Program Recommendations  AACE/ADA: New Consensus Statement on Inpatient Glycemic Control (2015)  Target Ranges:  Prepandial:   less than 140 mg/dL      Peak postprandial:   less than 180 mg/dL (1-2 hours)      Critically ill patients:  140 - 180 mg/dL   Lab Results  Component Value Date   GLUCAP 245 (H) February 10, 2022   HGBA1C 4.6 (L) 01/26/2022    Review of Glycemic Control  Latest Reference Range & Units 01/28/22 19:37 01/28/22 23:25 02-10-22 03:23 2022/02/10 07:44 02/10/2022 11:13  Glucose-Capillary 70 - 99 mg/dL 188 (H) 174 (H) 197 (H) 171 (H) 245 (H)   Diabetes history: DM  Outpatient Diabetes medications:  Per medication reconciliation, patient not taking insulin prior to admit Current orders for Inpatient glycemic control:  Novolog 0-9 units q 4 hours  Inpatient Diabetes Program Recommendations:    Note cortrak placement.  Consider adding Tube feed coverage if blood sugars>goal of 180 mg/dL.    Thanks,  Adah Perl, RN, BC-ADM Inpatient Diabetes Coordinator Pager [phone]  (8a-5p)

## 2022-02-07 NOTE — Progress Notes (Signed)
SLP Cancellation Note  Patient Details Name: Katrina Hughes MRN: [id number] DOB: 28-Sep-1950   Cancelled treatment:        Attempted to see pt for ongoing dysphagia management.  Pt now with cortrak placed.  Lips red and bleeding.  Pt politely declines PO trials at this time.  SLP to follow for PO readiness.    Celedonio Savage, [place], [place] Office: [phone] Feb 20, 2022, 10:45 AM

## 2022-02-07 DEATH — deceased

## 2022-02-12 LAB — SURGICAL PATHOLOGY
